# Patient Record
Sex: Female | Born: 1975 | Race: Black or African American | Hispanic: No | Marital: Single | Smoking: Never smoker
Health system: Southern US, Community
[De-identification: ages and names within clinical notes are randomized; demographics above are authoritative.]

## PROBLEM LIST (undated history)

## (undated) DIAGNOSIS — I10 Essential (primary) hypertension: Secondary | ICD-10-CM

## (undated) DIAGNOSIS — C189 Malignant neoplasm of colon, unspecified: Secondary | ICD-10-CM

## (undated) DIAGNOSIS — Z803 Family history of malignant neoplasm of breast: Secondary | ICD-10-CM

## (undated) DIAGNOSIS — F339 Major depressive disorder, recurrent, unspecified: Secondary | ICD-10-CM

## (undated) DIAGNOSIS — T4145XA Adverse effect of unspecified anesthetic, initial encounter: Secondary | ICD-10-CM

## (undated) DIAGNOSIS — G4730 Sleep apnea, unspecified: Secondary | ICD-10-CM

## (undated) DIAGNOSIS — G43909 Migraine, unspecified, not intractable, without status migrainosus: Secondary | ICD-10-CM

## (undated) DIAGNOSIS — D573 Sickle-cell trait: Secondary | ICD-10-CM

## (undated) DIAGNOSIS — T8859XA Other complications of anesthesia, initial encounter: Secondary | ICD-10-CM

## (undated) DIAGNOSIS — F4322 Adjustment disorder with anxiety: Secondary | ICD-10-CM

## (undated) DIAGNOSIS — D649 Anemia, unspecified: Secondary | ICD-10-CM

## (undated) DIAGNOSIS — F419 Anxiety disorder, unspecified: Secondary | ICD-10-CM

## (undated) DIAGNOSIS — I639 Cerebral infarction, unspecified: Secondary | ICD-10-CM

## (undated) HISTORY — DX: Family history of malignant neoplasm of breast: Z80.3

## (undated) HISTORY — PX: ILEOSTOMY REVISION: SHX1785

## (undated) HISTORY — PX: TUBAL LIGATION: SHX77

## (undated) HISTORY — DX: Sickle-cell trait: D57.3

## (undated) HISTORY — DX: Malignant neoplasm of colon, unspecified: C18.9

## (undated) HISTORY — PX: ENDOMETRIAL ABLATION: SHX621

## (undated) HISTORY — DX: Anemia, unspecified: D64.9

## (undated) HISTORY — PX: WISDOM TOOTH EXTRACTION: SHX21

## (undated) HISTORY — DX: Sleep apnea, unspecified: G47.30

## (undated) HISTORY — PX: TONSILLECTOMY: SUR1361

## (undated) HISTORY — PX: ABDOMINAL HYSTERECTOMY: SHX81

## (undated) HISTORY — PX: AUGMENTATION MAMMAPLASTY: SUR837

## (undated) HISTORY — DX: Major depressive disorder, recurrent, unspecified: F33.9

## (undated) HISTORY — DX: Cerebral infarction, unspecified: I63.9

---
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
## 2016-08-07 DIAGNOSIS — C189 Malignant neoplasm of colon, unspecified: Secondary | ICD-10-CM

## 2016-08-07 HISTORY — DX: Malignant neoplasm of colon, unspecified: C18.9

## 2016-09-04 ENCOUNTER — Emergency Department (HOSPITAL_COMMUNITY): Payer: Self-pay

## 2016-09-04 ENCOUNTER — Emergency Department (HOSPITAL_COMMUNITY)
Admission: EM | Admit: 2016-09-04 | Discharge: 2016-09-04 | Disposition: A | Discharge status: Home / Self Care | Payer: Self-pay | Attending: Emergency Medicine | Admitting: Emergency Medicine

## 2016-09-04 ENCOUNTER — Encounter (HOSPITAL_COMMUNITY): Payer: Self-pay | Admitting: Emergency Medicine

## 2016-09-04 DIAGNOSIS — Y939 Activity, unspecified: Secondary | ICD-10-CM | POA: Insufficient documentation

## 2016-09-04 DIAGNOSIS — Y929 Unspecified place or not applicable: Secondary | ICD-10-CM | POA: Insufficient documentation

## 2016-09-04 DIAGNOSIS — W010XXA Fall on same level from slipping, tripping and stumbling without subsequent striking against object, initial encounter: Secondary | ICD-10-CM | POA: Insufficient documentation

## 2016-09-04 DIAGNOSIS — Y999 Unspecified external cause status: Secondary | ICD-10-CM | POA: Insufficient documentation

## 2016-09-04 DIAGNOSIS — S9031XA Contusion of right foot, initial encounter: Secondary | ICD-10-CM | POA: Insufficient documentation

## 2016-09-04 DIAGNOSIS — R030 Elevated blood-pressure reading, without diagnosis of hypertension: Secondary | ICD-10-CM | POA: Insufficient documentation

## 2016-09-04 MED ORDER — HYDROCODONE-ACETAMINOPHEN 5-325 MG PO TABS
1.0000 | ORAL_TABLET | Freq: Once | ORAL | Status: AC
  Administered 2016-09-04: 1 via ORAL
  Filled 2016-09-04: qty 1

## 2016-09-04 MED ORDER — HYDROCODONE-ACETAMINOPHEN 5-325 MG PO TABS: ORAL_TABLET | ORAL | 0 refills | Status: DC

## 2016-09-04 NOTE — ED Provider Notes (Signed)
[facility] DEPT Provider Note   CSN: [id number] Arrival date & time: 09/04/16  2220     History   Chief Complaint Chief Complaint  Patient presents with  . Foot Injury     HPI  Blood pressure (!) 185/133, pulse 88, temperature 98.4 F (36.9 C), temperature source Oral, resp. rate 18, SpO2 98 %.  Donna Brandt is a 41 y.o. female complaining of persistent right foot pain and swelling status post mechanical slip and fall on ice approximately 10 days ago. She has been icing it and taking ibuprofen with little relief. Patient is walking a lot for her new job. She states that the pain is moderate to severe and worse at the end of the day.  No past medical history on file.  There are no active problems to display for this patient.   Past Surgical History:  Procedure Laterality Date  . ABDOMINAL HYSTERECTOMY    . TONSILLECTOMY    . TUBAL LIGATION      OB History    No data available       Home Medications    Prior to Admission medications   Not on File    Family History No family history on file.  Social History Social History  Substance Use Topics  . Smoking status: Never Smoker  . Smokeless tobacco: Not on file  . Alcohol use Yes     Allergies   Dilaudid [hydromorphone hcl]   Review of Systems Review of Systems  10 systems reviewed and found to be negative, except as noted in the HPI.   Physical Exam Updated Vital Signs BP (!) 185/133 (BP Location: Right Arm)   Pulse 88   Temp 98.4 F (36.9 C) (Oral)   Resp 18   SpO2 98%   Physical Exam  Constitutional: She is oriented to person, place, and time. She appears well-developed and well-nourished. No distress.  HENT:  Head: Normocephalic and atraumatic.  Mouth/Throat: Oropharynx is clear and moist.  Eyes: Conjunctivae and EOM are normal. Pupils are equal, round, and reactive to light.  Neck: Normal range of motion.  Cardiovascular: Normal rate, regular rhythm and intact distal pulses.     Pulmonary/Chest: Effort normal and breath sounds normal.  Abdominal: Soft. There is no tenderness.  Musculoskeletal: Normal range of motion. She exhibits edema and tenderness.  Diffuse edema and tenderness to right foot, most focally tender on the dorsum. DP and PT pulses are 2+ and patient has excellent range of motion to toes and can differentiate between pinprick and light touch. No tenderness to palpation along the bilateral malleoli.  Neurological: She is alert and oriented to person, place, and time.  Skin: She is not diaphoretic.  Psychiatric: She has a normal mood and affect.  Nursing note and vitals reviewed.    ED Treatments / Results  Labs (all labs ordered are listed, but only abnormal results are displayed) Labs Reviewed - No data to display  EKG  EKG Interpretation None       Radiology Dg Foot Complete Right  Result Date: 09/04/2016 CLINICAL DATA:  Status post fall, with sharp right foot pain and limited range of motion. Initial encounter. EXAM: RIGHT FOOT COMPLETE - 3+ VIEW COMPARISON:  None. FINDINGS: There is no evidence of fracture or dislocation. The joint spaces are preserved. There is no evidence of talar subluxation; the subtalar joint is unremarkable in appearance. No significant soft tissue abnormalities are seen. IMPRESSION: No evidence of fracture or dislocation. Electronically Signed   By:  Garald Balding M.D.   On: 09/04/2016 22:55    Procedures Procedures (including critical care time)  Medications Ordered in ED Medications  HYDROcodone-acetaminophen (NORCO/VICODIN) 5-325 MG per tablet 1 tablet (not administered)     Initial Impression / Assessment and Plan / ED Course  I have reviewed the triage vital signs and the nursing notes.  Pertinent labs & imaging results that were available during my care of the patient were reviewed by me and considered in my medical decision making (see chart for details).    Vitals:   09/04/16 2229  BP: (!)  185/133  Pulse: 88  Resp: 18  Temp: 98.4 F (36.9 C)  TempSrc: Oral  SpO2: 98%    Medications  HYDROcodone-acetaminophen (NORCO/VICODIN) 5-325 MG per tablet 1 tablet (not administered)    Donna Brandt is 41 y.o. female presenting with Persistent right foot pain status post mechanical fall several weeks ago. Neurovascular intact. X-ray negative. She is quite swollen. I think that she really needs to be nonweightbearing, I've counseled her that she should rest, ice and compress the foot and should not weight-bear until the swelling and pain have resolved. Blood pressure is elevated, advised her to follow with her primary care doctor on this.  Evaluation does not show pathology that would require ongoing emergent intervention or inpatient treatment. Pt is hemodynamically stable and mentating appropriately. Discussed findings and plan with patient/guardian, who agrees with care plan. All questions answered. Return precautions discussed and outpatient follow up given.      Final Clinical Impressions(s) / ED Diagnoses   Final diagnoses:  Contusion of right foot, initial encounter  Elevated blood pressure reading    New Prescriptions New Prescriptions   No medications on file     Monico Blitz, PA-C 09/04/16 [place], MD 09/06/16 [phone]

## 2016-09-04 NOTE — Discharge Instructions (Signed)
Rest, Ice intermittently (in the first 24-48 hours), Gentle compression with an Ace wrap, and elevate (Limb above the level of the heart)   Take up to 800mg  of ibuprofen (that is usually 4 over the counter pills)  3 times a day for 5 days. Take with food.  Take vicodin for breakthrough pain, do not drink alcohol, drive, care for children or do other critical tasks while taking vicodin.  Please follow with your primary care doctor in the next 5 days for high blood pressure evaluation. If you do not have a primary care doctor, present to urgent care. Reduce salt intake. Seek emergency medical care for unilateral weakness, slurring, change in vision, or chest pain and shortness of breath.

## 2016-09-04 NOTE — ED Triage Notes (Signed)
Pt states she fell on the ice 1.5 weeks ago and still has R foot pain and swelling. Alert and oriented.

## 2016-12-10 ENCOUNTER — Emergency Department (HOSPITAL_COMMUNITY)
Admission: EM | Admit: 2016-12-10 | Discharge: 2016-12-11 | Disposition: A | Discharge status: Home / Self Care | Payer: Medicaid Other | Attending: Emergency Medicine | Admitting: Emergency Medicine

## 2016-12-10 ENCOUNTER — Encounter (HOSPITAL_COMMUNITY): Payer: Self-pay | Admitting: Emergency Medicine

## 2016-12-10 DIAGNOSIS — R51 Headache: Secondary | ICD-10-CM | POA: Diagnosis not present

## 2016-12-10 DIAGNOSIS — R519 Headache, unspecified: Secondary | ICD-10-CM

## 2016-12-10 DIAGNOSIS — Z79899 Other long term (current) drug therapy: Secondary | ICD-10-CM | POA: Insufficient documentation

## 2016-12-10 HISTORY — DX: Migraine, unspecified, not intractable, without status migrainosus: G43.909

## 2016-12-10 NOTE — ED Triage Notes (Signed)
Patient complaining of migraine. Patient states it started around 6 pm and took some ibuprofen. She states she still has the migraine.

## 2016-12-10 NOTE — ED Provider Notes (Signed)
[facility] DEPT Provider Note   CSN: [id number] Arrival date & time: 12/10/16  2304  By signing my name below, I, Higinio Plan, attest that this documentation has been prepared under the direction and in the presence of non-physician practitioner, Montine Circle, PA-C. Electronically Signed: Higinio Plan, Scribe. 12/11/2016. 12:07 AM.  History   Chief Complaint Chief Complaint  Patient presents with  . Migraine   The history is provided by the patient. No language interpreter was used.   HPI Comments: Donna Brandt is a 41 y.o. female with PMHx of migraines, who presents to the Emergency Department complaining of gradually worsening, headache consistent with previous migraines that began ~6 hours PTA. Pt reports her last migraine occurred ~2.5 years ago. She notes associated photophobia and phonophobia. She states she has taken Ibuprofen for her pain with mild relief. Pt denies any fever, chills, nausea, vomiting, or recent consultation with a neurologist for her recurrent headaches.   Past Medical History:  Diagnosis Date  . Migraine    There are no active problems to display for this patient.  Past Surgical History:  Procedure Laterality Date  . ABDOMINAL HYSTERECTOMY    . TONSILLECTOMY    . TUBAL LIGATION      OB History    No data available     Home Medications    Prior to Admission medications   Medication Sig Start Date End Date Taking? Authorizing Provider  HYDROcodone-acetaminophen (NORCO/VICODIN) 5-325 MG tablet Take 1-2 tablets by mouth every 6 hours as needed for pain and/or cough. 09/04/16   Pisciotta, Charna Elizabeth    Family History History reviewed. No pertinent family history.  Social History Social History  Substance Use Topics  . Smoking status: Never Smoker  . Smokeless tobacco: Never Used  . Alcohol use Yes   Allergies   Dilaudid [hydromorphone hcl]  Review of Systems Review of Systems  Constitutional: Negative for chills and fever.  Eyes:  Positive for photophobia.  Gastrointestinal: Negative for nausea and vomiting.  Neurological: Positive for headaches.   Physical Exam Updated Vital Signs BP (!) 162/122 (BP Location: Left Arm)   Pulse 92   Temp 98.4 F (36.9 C) (Oral)   Resp 16   Ht 5\' 7"  (1.702 m)   Wt 180 lb (81.6 kg)   SpO2 100%   BMI 28.19 kg/m   Physical Exam  Constitutional: She is oriented to person, place, and time. She appears well-developed and well-nourished. No distress.  HENT:  Head: Normocephalic and atraumatic.  Right Ear: External ear normal.  Left Ear: External ear normal.  Eyes: Conjunctivae and EOM are normal. Pupils are equal, round, and reactive to light.  Neck: Normal range of motion. Neck supple.  No pain with neck flexion, no meningismus  Cardiovascular: Normal rate, regular rhythm and normal heart sounds.  Exam reveals no gallop and no friction rub.   No murmur heard. Pulmonary/Chest: Effort normal and breath sounds normal. No respiratory distress. She has no wheezes. She has no rales. She exhibits no tenderness.  Abdominal: Soft. She exhibits no distension and no mass. There is no tenderness. There is no rebound and no guarding.  Musculoskeletal: Normal range of motion. She exhibits no edema or tenderness.  Normal gait.  Neurological: She is alert and oriented to person, place, and time. She has normal reflexes.  CN 3-12 intact, normal finger to nose, no pronator drift, sensation and strength intact bilaterally.  Skin: Skin is warm and dry.  Psychiatric: She has a normal mood and  affect. Her behavior is normal. Judgment and thought content normal.  Nursing note and vitals reviewed.  ED Treatments / Results  DIAGNOSTIC STUDIES:  Oxygen Saturation is 100% on RA, normal by my interpretation.    COORDINATION OF CARE:  12:07 AM Discussed treatment plan with pt at bedside and pt agreed to plan.  Labs (all labs ordered are listed, but only abnormal results are displayed) Labs  Reviewed - No data to display  EKG  EKG Interpretation None       Radiology No results found.  Procedures Procedures (including critical care time)  Medications Ordered in ED Medications - No data to display  Initial Impression / Assessment and Plan / ED Course  I have reviewed the triage vital signs and the nursing notes.  Pertinent labs & imaging results that were available during my care of the patient were reviewed by me and considered in my medical decision making (see chart for details).     Pt HA treated and improved while in ED.  Presentation is like pts typical HA and is not concerning for [facility], ICH, Meningitis, or temporal arteritis. Pt is afebrile with no focal neuro deficits, nuchal rigidity, or change in vision. Pt is to follow up with PCP to discuss prophylactic medication. Pt verbalizes understanding and is agreeable with plan to dc.    I personally performed the services described in this documentation, which was scribed in my presence. The recorded information has been reviewed and is accurate.     Final Clinical Impressions(s) / ED Diagnoses   Final diagnoses:  Acute nonintractable headache, unspecified headache type    New Prescriptions New Prescriptions   No medications on file     Montine Circle, Hershal Coria 12/11/16 0207    Rolland Porter, MD 12/11/16 0400

## 2016-12-11 MED ORDER — KETOROLAC TROMETHAMINE 30 MG/ML IJ SOLN
30.0000 mg | Freq: Once | INTRAMUSCULAR | Status: AC
  Administered 2016-12-11: 30 mg via INTRAVENOUS
  Filled 2016-12-11: qty 1

## 2016-12-11 MED ORDER — SODIUM CHLORIDE 0.9 % IV BOLUS (SEPSIS)
1000.0000 mL | Freq: Once | INTRAVENOUS | Status: AC
  Administered 2016-12-11: 1000 mL via INTRAVENOUS

## 2016-12-11 MED ORDER — METOCLOPRAMIDE HCL 5 MG/ML IJ SOLN
10.0000 mg | Freq: Once | INTRAMUSCULAR | Status: AC
  Administered 2016-12-11: 10 mg via INTRAVENOUS
  Filled 2016-12-11: qty 2

## 2016-12-11 MED ORDER — DIPHENHYDRAMINE HCL 50 MG/ML IJ SOLN
25.0000 mg | Freq: Once | INTRAMUSCULAR | Status: AC
  Administered 2016-12-11: 25 mg via INTRAVENOUS
  Filled 2016-12-11: qty 1

## 2017-03-23 DIAGNOSIS — K629 Disease of anus and rectum, unspecified: Secondary | ICD-10-CM | POA: Diagnosis not present

## 2017-03-23 DIAGNOSIS — Z6832 Body mass index (BMI) 32.0-32.9, adult: Secondary | ICD-10-CM | POA: Diagnosis not present

## 2017-03-27 DIAGNOSIS — H40033 Anatomical narrow angle, bilateral: Secondary | ICD-10-CM | POA: Diagnosis not present

## 2017-03-27 DIAGNOSIS — H16223 Keratoconjunctivitis sicca, not specified as Sjogren's, bilateral: Secondary | ICD-10-CM | POA: Diagnosis not present

## 2017-03-30 ENCOUNTER — Encounter: Payer: Self-pay | Admitting: Oncology

## 2017-04-10 ENCOUNTER — Encounter: Payer: Self-pay | Admitting: Radiation Oncology

## 2017-04-10 ENCOUNTER — Ambulatory Visit (HOSPITAL_BASED_OUTPATIENT_CLINIC_OR_DEPARTMENT_OTHER): Payer: BLUE CROSS/BLUE SHIELD | Admitting: Oncology

## 2017-04-10 ENCOUNTER — Telehealth: Payer: Self-pay | Admitting: Oncology

## 2017-04-10 VITALS — BP 158/99 | HR 80 | Temp 98.4°F | Resp 17 | Ht 67.0 in | Wt 198.9 lb

## 2017-04-10 DIAGNOSIS — C20 Malignant neoplasm of rectum: Secondary | ICD-10-CM

## 2017-04-10 NOTE — Telephone Encounter (Signed)
Gave patient AVS and calendar of upcoming September appointments.  °

## 2017-04-10 NOTE — Progress Notes (Signed)
[facility] Patient Consult   Referring MD: Angus Palms No address on file   Donna Brandt 41 y.o.  [birth year]    Reason for Referral: Rectal cancer   HPI: Donna Brandt reports intermittent rectal bleeding, abdominal pain, and diarrhea for several months. She saw her primary physician in [place] and was referred to Dr.Saleeby. A colonoscopy (we do not have the colonoscopy report available today) confirmed fragments of an adenomatous lesion with high-grade dysplasia. No definite submucosa was available to evaluate for invasive adenocarcinoma.  She was referred for CTs of the chest, abdomen, and pelvis on 03/16/2017. No adenopathy. No evidence for hepatic metastases. Left anterior rectal wall thickening and hyperenhancement was noted. A lobulated 18 mm low-attenuation lesion was noted in the mesorectal fat felt to possibly represent a fluid collection versus a lymph node. No definite enhancing perirectal lymph nodes were noted. A left ovarian cyst was noted.  She was referred to Dr. Rayann Heman for surgical consultation 03/23/2017. A mass could not be palpated on digital rectal examination. A proctoscopy revealed a rectal mass  at 6-7 centimeters from the anal verge anteriorly. A repeat biopsy was performed. The pathology revealed polypoid colorectal mucosa with detached fragments of adenomatous mucosa and a mucosal lymphoid aggregate.  A pelvic MRI on 03/21/2017 revealed a T3b,N0 tumor. The small fluid density seen on the prior CT appeared to represent simple fluid. A physiologic cyst was noted at the left ovary.  Dr.Altom recommends neoadjuvant chemotherapy/radiation followed by a robotic assisted low anterior resection and temporary diverting loop ileostomy.    Past Medical History:  Diagnosis Date  . MigraineHeadaches      . Hypertension    . G3 P3    . Depression  Past Surgical History:  Procedure Laterality Date  . ABDOMINAL HYSTERECTOMY    .  TONSILLECTOMY    . TUBAL LIGATION      .  Bilateral breast implants   Medications: Reviewed  Allergies:  Allergies  Allergen Reactions  . Dilaudid [Hydromorphone Hcl] Itching  . Latex Rash    Skin sensitivity     Family history: A maternal great aunts had breast cancer in her 6s. She has 4 sisters and 2 brothers. No other family history of cancer.  Social History:   She lives in [place] with 2 children. She works in [employer], research (medical). She does not use cigarettes. She drinks wine occasionally. No transfusion history. No risk factor for HIV or hepatitis.  ROS:   Positives include:Exertional dyspnea, nausea, dull abdominal pain with intermittent episodes of sharp low abdominal pain lasting 2 minutes, rectal bleeding, diarrhea  A complete ROS was otherwise negative.  Physical Exam:  Blood pressure (!) 158/99, pulse 80, temperature 98.4 F (36.9 C), temperature source Oral, resp. rate 17, height 5\' 7"  (1.702 m), weight 198 lb 14.4 oz (90.2 kg), SpO2 100 %.  HEENT: Oral cavity without visible mass, neck without mass  Lungs: Clear bilaterally  Cardiac: Regular rate and rhythm  Abdomen: No hepatosplenomegaly, no mass, tender in the right low abdomen   Vascular: No leg edema  Lymph nodes: No cervical, supraclavicular, axillary, or inguinal nodes  Neurologic: Alert and oriented, the motor exam appears intact in the upper and lower extremities  Skin: No rash, multiple tattoos  Musculoskeletal: No spine tenderness  LAB:  CEA on 02/28/2017-61.4  Imaging: As per history of present illness, image is not available for review today   Assessment/Plan:   1. Rectal cancer, clinical stage T3b,N0,M0  colonoscopy 03/06/2017-biopsy  of a rectal mass revealed fragments of an adenomatous lesion with high-grade dysplasia, definite submucosa not available to evaluate for invasion  Proctoscopy/biopsy 03/23/2017 confirmed a mass at 6-7 centimeters from the anal verge, biopsy revealed polypoid  colorectal mucosa with detached fragments of adenomatous mucosa  Staging CTs 03/16/2017, anterior rectal mass, no evidence of metastatic disease, no adenopathy,  MRI 03/21/2017- T3b,N0 rectal tumor  2.   Hypertension  3.   Depression     Disposition:   Donna Brandt is is with rectal bleeding, low abdominal pain, and diarrhea. She was found to have a mid rectal mass. On proctoscopy. Staging CTs/pelvic MRI are consistent with a locally advanced rectal cancer. Repeat biopsies have been nondiagnostic for invasive carcinoma, but the clinical history in conjunction with the imaging and elevated CEA are consistent with a diagnosis of invasive adenocarcinoma.  I discussed the rationale for neoadjuvant chemotherapy/radiation to be followed by definitive surgery. I specifically discussed capecitabine chemotherapy to be given concurrent with radiation. She will follow-up in medical oncology after surgery to determine the indication for adjuvant chemotherapy.  I reviewed the potential toxicities associated with capecitabine including the chance for nausea/vomiting, mucositis, diarrhea, alopecia, and hematologic toxicity. We discussed the rash, sun sensitivity, hyperpigmentation, and hand/foot syndrome associated with capecitabine. She will attend a chemotherapy teaching class.  I will present her case at the GI tumor conference. I will refer her to gastroenterology to consider a repeat biopsy to confirm invasive adenocarcinoma.  She will be referred to Dr. Lisbeth Renshaw. I anticipate the start of chemotherapy/radiation the week of 04/23/2017 or 04/30/2017.  50 minutes were spent with the patient today. The majority of the time was used for counseling and coordination of care.  Donneta Romberg, MD  04/10/2017, 2:14 PM

## 2017-04-11 ENCOUNTER — Other Ambulatory Visit: Payer: Self-pay | Admitting: Radiation Oncology

## 2017-04-11 ENCOUNTER — Inpatient Hospital Stay
Admission: RE | Admit: 2017-04-11 | Discharge: 2017-04-11 | Disposition: A | Discharge status: Home / Self Care | Payer: Self-pay | Source: Ambulatory Visit | Attending: Radiation Oncology | Admitting: Radiation Oncology

## 2017-04-11 ENCOUNTER — Telehealth: Payer: Self-pay

## 2017-04-11 ENCOUNTER — Other Ambulatory Visit: Payer: Self-pay

## 2017-04-11 DIAGNOSIS — C801 Malignant (primary) neoplasm, unspecified: Secondary | ICD-10-CM

## 2017-04-11 DIAGNOSIS — D490 Neoplasm of unspecified behavior of digestive system: Secondary | ICD-10-CM

## 2017-04-11 NOTE — Progress Notes (Signed)
GI Location of Tumor / Histology:  Rectal Cancer  Donna Brandt presented with symptoms of intermittent rectal bleeding, abdominal pain, and diarrhea   Biopsies of   03/06/2017 colonoscopy -biopsy of a rectal mass revealed fragments of an adenomatous lesion with high-grade dysplasia, definite submucosa not available to evaluate for invasion  03/23/2017  Proctoscopy/biopsy confirmed a mass at 6-7 centimeters from the anal verge, biopsy revealed polypoid colorectal mucosa with detached fragments of adenomatous mucosa  03/16/2017  Staging CTs 03/16/2017, anterior rectal mass, no evidence of metastatic disease, no adenopathy,  03/21/2017  MRI 03/21/2017- T3b,N0 rectal tumor  Past/Anticipated interventions by surgeon, if any:   Past/Anticipated interventions by medical oncology, if any:   Weight changes, if any:  None  Bowel/Bladder complaints, if any: Having constipation and diarrehea Nausea / Vomiting, if any: Nausea sometime ,No vomiting  Pain issues, if any:  Has a constant pain in her abdominal area .5/10  Any blood per rectum:   Yes ,most of the time.  SAFETY ISSUES:  Prior radiation? No    Pacemaker/ICD? No  Possible current pregnancy?  No  Is the patient on methotrexate?  No  Current Complaints/Details:  Family history: A maternal great aunts had breast cancer in her 52's. No other family history of cancer.   Vitals:   04/12/17 1040  BP: 119/78  Pulse: 70  Resp: 18  Temp: 98.6 F (37 C)  TempSrc: Oral  SpO2: 100%  Weight: 194 lb 4 oz (88.1 kg)   Wt Readings from Last 3 Encounters:  04/12/17 194 lb 4 oz (88.1 kg)  04/10/17 198 lb 14.4 oz (90.2 kg)  12/10/16 180 lb (81.6 kg)

## 2017-04-11 NOTE — Telephone Encounter (Signed)
Donna Brandt (friend that accompanied patient to med/onc appointment on 04/10/17) called on behalf of patient asking about an appointment that was scheduled for 04/12/17.  Progress notes from [facility] show that patient is scheduled for a Flex Sig. on 04/12/17. Patient stated that she was told that her appointments for Rad Onc on 04/12/17 would be cancelled. Questions answered.

## 2017-04-11 NOTE — Telephone Encounter (Signed)
Flex scheduled, pt instructed and medications reviewed.  Patient instructions mailed to home.  Patient to call with any questions or concerns.  

## 2017-04-11 NOTE — Telephone Encounter (Signed)
-----   Message from Milus Banister, MD sent at 04/10/2017  8:14 PM EDT ----- [name] get her in this week.  Thanks  Donald Jacque, She needs flexible sigmoidoscopy this Thursday at [facility] with moderate sedation for rectal tumor.  Thanks  DJ  ----- Message ----- From: Ladell Pier, MD Sent: 04/10/2017   5:43 PM To: Milus Banister, MD  New patient with rectal cancer,T3,N0 by MRI at [facility], tumor at [facility] here from [facility] Had 2 biopsies in [place] that have not confirmed invasive carcinoma, path reports in [place]  Can you get her scheduled for a repeat biopsy in attempt to document invasive disease  Plan for neoadjuvant chemotherapy/radiation  Thanks,  Leroy Sea

## 2017-04-11 NOTE — Progress Notes (Signed)
Late Entry : 04/10/17 @ 14:00  Oncology Nurse Navigator Documentation  Navigator Location: [facility] (04/10/17 1400) Referral date to RadOnc/MedOnc: 03/30/17 (04/10/17 1400) )Navigator Encounter Type: Initial MedOnc (04/10/17 1400)   Met with patient, patient's friend and Dr. Benay Spice during new patient appointment. Patient provided with literature related to rectal cancer, chemotherapy and contact information for Medical/Oncology team. Patient provided with information about my role as GI Navigator as well as my contact information.    Abnormal Finding Date: 03/21/17 (04/10/17 1400) Confirmed Diagnosis Date: 03/21/17 (04/10/17 1400)               Patient Visit Type: MedOnc (04/10/17 1400) Treatment Phase: Pre-Tx/Tx Discussion (04/10/17 1400) Barriers/Navigation Needs: Education;Coordination of Care (04/10/17 1400) Education: Coping with Diagnosis/ Prognosis;Understanding Cancer/ Treatment Options;Newly Diagnosed Cancer Education (04/10/17 1400) Interventions: Education;Psycho-social support (04/10/17 1400)     Education Method: Written;Verbal (04/10/17 1400)  Support Groups/Services: GI Support Group (04/10/17 1400)   Acuity: Level 2 (04/10/17 1400)   Acuity Level 2: Initial guidance, education and coordination as needed;Educational needs;Assistance expediting appointments;Ongoing guidance and education throughout treatment as needed (04/10/17 1400)     Time Spent with Patient: 30 (04/10/17 1400)

## 2017-04-11 NOTE — Telephone Encounter (Signed)
I spoke with the pt's husband.  I was unable to reach the pt by phone, I called the pt's emergency contact (her husband)  He will contact her at work and have her return my call to go over the instructions for tomorrow    Donna Brandt   DOB: [birth year] MRN: [id number] Procedure Date: 04/12/17 Arrival Time: 1245 pm Procedure Time: 215 pm   Location of Procedure: [facility] Registration   PREPARATION FOR FLEXIBLE SIGMOIDOSCOPY WITH MAGNESIUM CITRATE  Prior to the day before your procedure, purchase one 8 oz. bottle of Magnesium Citrate and one Fleet Enema from the laxative section of your drugstore.   THE DAY BEFORE YOUR PROCEDURE:   DATE: 04/11/17  DAY: Wednesday  1. Have a clear liquid dinner the night before your procedure.  2. Do not drink anything colored red or purple. Avoid juices with pulp. No orange juice.  CLEAR LIQUIDS INCLUDE: Water Jello  Ice Popsicles  Tea (sugar ok, no milk/cream) Powdered fruit flavored drinks  Coffee (sugar ok, no milk/cream) Gatorade  Juice: apple, white grape, white cranberry Lemonade  Clear bullion, consomme, broth Carbonated beverages (any kind)  Strained chicken noodle soup Hard Candy   3. At 7:00 pm the night before your procedure, drink one bottle of Magnesium Citrate over ice.  4. Drink at least 3 more glasses of clear liquids before bedtime (preferably juices).  5. Results are expected usually within 1 to 6 hours after taking the Magnesium Citrate.    THE DAY OF YOUR PROCEDURE:   DATE: 04/12/17 DAY: Thursday  1. Use Fleet Enema one hour prior to coming for procedure  2. you may drink clear liquids until 1015 am, which is 4 hours before your procedure.   MEDICATION INSTRUCTIONS  Unless otherwise instructed, you should take regular prescription medications with a small sip of water as early as possible the morning of your procedure.  Diabetic patients - see separate instructions.    OTHER INSTRUCTIONS  You will need  a responsible adult at least 42 years of age to accompany you and drive you home. This person must remain in the waiting room during your procedure.  Wear loose fitting clothing that is easily removed.  Leave jewelry and other valuables at home. However, you may wish to bring a book to read or an iPod/MP3 player to listen to music as you wait for your procedure to start.  Remove all body piercing jewelry and leave at home.  Total time from sign-in until discharge is approximately 2-3 hours.  You should go home directly after your procedure and rest. You can resume normal activities the day after your procedure.  The day of your procedure you should not:  Drive  Make legal decisions  Operate machinery  Drink alcohol  Return to work  You will receive specific instructions about eating, activities and medications before you leave.

## 2017-04-12 ENCOUNTER — Ambulatory Visit
Admission: RE | Admit: 2017-04-12 | Discharge: 2017-04-12 | Disposition: A | Discharge status: Home / Self Care | Payer: BLUE CROSS/BLUE SHIELD | Source: Ambulatory Visit | Attending: Radiation Oncology | Admitting: Radiation Oncology

## 2017-04-12 ENCOUNTER — Encounter: Payer: Self-pay | Admitting: Radiation Oncology

## 2017-04-12 ENCOUNTER — Encounter (HOSPITAL_COMMUNITY)
Admission: RE | Disposition: A | Discharge status: Home / Self Care | Payer: Self-pay | Source: Ambulatory Visit | Attending: Gastroenterology

## 2017-04-12 ENCOUNTER — Ambulatory Visit: Payer: BLUE CROSS/BLUE SHIELD | Admitting: Radiation Oncology

## 2017-04-12 ENCOUNTER — Encounter (HOSPITAL_COMMUNITY): Payer: Self-pay

## 2017-04-12 ENCOUNTER — Ambulatory Visit (HOSPITAL_COMMUNITY)
Admission: RE | Admit: 2017-04-12 | Discharge: 2017-04-12 | Disposition: A | Discharge status: Home / Self Care | Payer: BLUE CROSS/BLUE SHIELD | Source: Ambulatory Visit | Attending: Gastroenterology | Admitting: Gastroenterology

## 2017-04-12 ENCOUNTER — Telehealth: Payer: Self-pay | Admitting: Pharmacist

## 2017-04-12 VITALS — BP 119/78 | HR 70 | Temp 98.6°F | Resp 18 | Wt 194.2 lb

## 2017-04-12 DIAGNOSIS — R197 Diarrhea, unspecified: Secondary | ICD-10-CM | POA: Insufficient documentation

## 2017-04-12 DIAGNOSIS — Z803 Family history of malignant neoplasm of breast: Secondary | ICD-10-CM | POA: Insufficient documentation

## 2017-04-12 DIAGNOSIS — C20 Malignant neoplasm of rectum: Secondary | ICD-10-CM | POA: Insufficient documentation

## 2017-04-12 DIAGNOSIS — Z79891 Long term (current) use of opiate analgesic: Secondary | ICD-10-CM | POA: Insufficient documentation

## 2017-04-12 DIAGNOSIS — N83202 Unspecified ovarian cyst, left side: Secondary | ICD-10-CM | POA: Insufficient documentation

## 2017-04-12 DIAGNOSIS — Z885 Allergy status to narcotic agent status: Secondary | ICD-10-CM | POA: Insufficient documentation

## 2017-04-12 DIAGNOSIS — R109 Unspecified abdominal pain: Secondary | ICD-10-CM | POA: Diagnosis not present

## 2017-04-12 DIAGNOSIS — Z91040 Latex allergy status: Secondary | ICD-10-CM | POA: Insufficient documentation

## 2017-04-12 DIAGNOSIS — Z90710 Acquired absence of both cervix and uterus: Secondary | ICD-10-CM | POA: Insufficient documentation

## 2017-04-12 DIAGNOSIS — Z510 Encounter for antineoplastic radiation therapy: Secondary | ICD-10-CM | POA: Insufficient documentation

## 2017-04-12 DIAGNOSIS — K629 Disease of anus and rectum, unspecified: Secondary | ICD-10-CM | POA: Diagnosis present

## 2017-04-12 DIAGNOSIS — F329 Major depressive disorder, single episode, unspecified: Secondary | ICD-10-CM | POA: Diagnosis not present

## 2017-04-12 DIAGNOSIS — Z79899 Other long term (current) drug therapy: Secondary | ICD-10-CM | POA: Insufficient documentation

## 2017-04-12 DIAGNOSIS — Z9889 Other specified postprocedural states: Secondary | ICD-10-CM | POA: Insufficient documentation

## 2017-04-12 DIAGNOSIS — I10 Essential (primary) hypertension: Secondary | ICD-10-CM | POA: Diagnosis not present

## 2017-04-12 DIAGNOSIS — D490 Neoplasm of unspecified behavior of digestive system: Secondary | ICD-10-CM

## 2017-04-12 HISTORY — PX: FLEXIBLE SIGMOIDOSCOPY: SHX5431

## 2017-04-12 HISTORY — DX: Essential (primary) hypertension: I10

## 2017-04-12 SURGERY — SIGMOIDOSCOPY, FLEXIBLE
Anesthesia: Moderate Sedation

## 2017-04-12 MED ORDER — CAPECITABINE 500 MG PO TABS: ORAL_TABLET | ORAL | 0 refills | Status: DC

## 2017-04-12 MED ORDER — SPOT INK MARKER SYRINGE KIT
PACK | SUBMUCOSAL | Status: AC
  Filled 2017-04-12: qty 5

## 2017-04-12 MED ORDER — SPOT INK MARKER SYRINGE KIT
PACK | SUBMUCOSAL | Status: DC | PRN
  Administered 2017-04-12: 2 mL via SUBMUCOSAL

## 2017-04-12 MED ORDER — FENTANYL CITRATE (PF) 100 MCG/2ML IJ SOLN
INTRAMUSCULAR | Status: AC
  Filled 2017-04-12: qty 2

## 2017-04-12 MED ORDER — MIDAZOLAM HCL 5 MG/ML IJ SOLN
INTRAMUSCULAR | Status: AC
  Filled 2017-04-12: qty 2

## 2017-04-12 MED ORDER — SODIUM CHLORIDE 0.9 % IV SOLN: INTRAVENOUS | Status: DC

## 2017-04-12 MED ORDER — FENTANYL CITRATE (PF) 100 MCG/2ML IJ SOLN
INTRAMUSCULAR | Status: DC | PRN
  Administered 2017-04-12 (×2): 25 ug via INTRAVENOUS

## 2017-04-12 MED ORDER — DIPHENHYDRAMINE HCL 50 MG/ML IJ SOLN
INTRAMUSCULAR | Status: AC
  Filled 2017-04-12: qty 1

## 2017-04-12 MED ORDER — MIDAZOLAM HCL 10 MG/2ML IJ SOLN
INTRAMUSCULAR | Status: DC | PRN
Start: 2017-04-12 — End: 2017-04-12
  Administered 2017-04-12: 1 mg via INTRAVENOUS
  Administered 2017-04-12 (×3): 2 mg via INTRAVENOUS

## 2017-04-12 NOTE — H&P (View-Only) (Signed)
[facility] Patient Consult   Referring MD: Angus Palms No address on file   Donna Brandt 41 y.o.  [birth year]    Reason for Referral: Rectal cancer   HPI: Donna Brandt reports intermittent rectal bleeding, abdominal pain, and diarrhea for several months. She saw her primary physician in [place] and was referred to Dr.Saleeby. A colonoscopy (we do not have the colonoscopy report available today) confirmed fragments of an adenomatous lesion with high-grade dysplasia. No definite submucosa was available to evaluate for invasive adenocarcinoma.  She was referred for CTs of the chest, abdomen, and pelvis on 03/16/2017. No adenopathy. No evidence for hepatic metastases. Left anterior rectal wall thickening and hyperenhancement was noted. A lobulated 18 mm low-attenuation lesion was noted in the mesorectal fat felt to possibly represent a fluid collection versus a lymph node. No definite enhancing perirectal lymph nodes were noted. A left ovarian cyst was noted.  She was referred to Dr. Rayann Heman for surgical consultation 03/23/2017. A mass could not be palpated on digital rectal examination. A proctoscopy revealed a rectal mass  at 6-7 centimeters from the anal verge anteriorly. A repeat biopsy was performed. The pathology revealed polypoid colorectal mucosa with detached fragments of adenomatous mucosa and a mucosal lymphoid aggregate.  A pelvic MRI on 03/21/2017 revealed a T3b,N0 tumor. The small fluid density seen on the prior CT appeared to represent simple fluid. A physiologic cyst was noted at the left ovary.  Dr.Altom recommends neoadjuvant chemotherapy/radiation followed by a robotic assisted low anterior resection and temporary diverting loop ileostomy.    Past Medical History:  Diagnosis Date  . MigraineHeadaches      . Hypertension    . G3 P3    . Depression  Past Surgical History:  Procedure Laterality Date  . ABDOMINAL HYSTERECTOMY    .  TONSILLECTOMY    . TUBAL LIGATION      .  Bilateral breast implants   Medications: Reviewed  Allergies:  Allergies  Allergen Reactions  . Dilaudid [Hydromorphone Hcl] Itching  . Latex Rash    Skin sensitivity     Family history: A maternal great aunts had breast cancer in her 53s. She has 4 sisters and 2 brothers. No other family history of cancer.  Social History:   She lives in [place] with 2 children. She works in [employer], research (medical). She does not use cigarettes. She drinks wine occasionally. No transfusion history. No risk factor for HIV or hepatitis.  ROS:   Positives include:Exertional dyspnea, nausea, dull abdominal pain with intermittent episodes of sharp low abdominal pain lasting 2 minutes, rectal bleeding, diarrhea  A complete ROS was otherwise negative.  Physical Exam:  Blood pressure (!) 158/99, pulse 80, temperature 98.4 F (36.9 C), temperature source Oral, resp. rate 17, height 5\' 7"  (1.702 m), weight 198 lb 14.4 oz (90.2 kg), SpO2 100 %.  HEENT: Oral cavity without visible mass, neck without mass  Lungs: Clear bilaterally  Cardiac: Regular rate and rhythm  Abdomen: No hepatosplenomegaly, no mass, tender in the right low abdomen   Vascular: No leg edema  Lymph nodes: No cervical, supraclavicular, axillary, or inguinal nodes  Neurologic: Alert and oriented, the motor exam appears intact in the upper and lower extremities  Skin: No rash, multiple tattoos  Musculoskeletal: No spine tenderness  LAB:  CEA on 02/28/2017-61.4  Imaging: As per history of present illness, image is not available for review today   Assessment/Plan:   1. Rectal cancer, clinical stage T3b,N0,M0  colonoscopy 03/06/2017-biopsy  of a rectal mass revealed fragments of an adenomatous lesion with high-grade dysplasia, definite submucosa not available to evaluate for invasion  Proctoscopy/biopsy 03/23/2017 confirmed a mass at 6-7 centimeters from the anal verge, biopsy revealed polypoid  colorectal mucosa with detached fragments of adenomatous mucosa  Staging CTs 03/16/2017, anterior rectal mass, no evidence of metastatic disease, no adenopathy,  MRI 03/21/2017- T3b,N0 rectal tumor  2.   Hypertension  3.   Depression     Disposition:   Donna Brandt is is with rectal bleeding, low abdominal pain, and diarrhea. She was found to have a mid rectal mass. On proctoscopy. Staging CTs/pelvic MRI are consistent with a locally advanced rectal cancer. Repeat biopsies have been nondiagnostic for invasive carcinoma, but the clinical history in conjunction with the imaging and elevated CEA are consistent with a diagnosis of invasive adenocarcinoma.  I discussed the rationale for neoadjuvant chemotherapy/radiation to be followed by definitive surgery. I specifically discussed capecitabine chemotherapy to be given concurrent with radiation. She will follow-up in medical oncology after surgery to determine the indication for adjuvant chemotherapy.  I reviewed the potential toxicities associated with capecitabine including the chance for nausea/vomiting, mucositis, diarrhea, alopecia, and hematologic toxicity. We discussed the rash, sun sensitivity, hyperpigmentation, and hand/foot syndrome associated with capecitabine. She will attend a chemotherapy teaching class.  I will present her case at the GI tumor conference. I will refer her to gastroenterology to consider a repeat biopsy to confirm invasive adenocarcinoma.  She will be referred to Dr. Lisbeth Renshaw. I anticipate the start of chemotherapy/radiation the week of 04/23/2017 or 04/30/2017.  50 minutes were spent with the patient today. The majority of the time was used for counseling and coordination of care.  Donneta Romberg, MD  04/10/2017, 2:14 PM

## 2017-04-12 NOTE — Interval H&P Note (Signed)
History and Physical Interval Note:  04/12/2017 12:51 PM  Donna Brandt  has presented today for surgery, with the diagnosis of rectal tumor   The various methods of treatment have been discussed with the patient and family. After consideration of risks, benefits and other options for treatment, the patient has consented to  Procedure(s): FLEXIBLE SIGMOIDOSCOPY (N/A) as a surgical intervention .  The patient's history has been reviewed, patient examined, no change in status, stable for surgery.  I have reviewed the patient's chart and labs.  Questions were answered to the patient's satisfaction.     Milus Banister

## 2017-04-12 NOTE — Op Note (Signed)
[facility] Patient Name: Donna Brandt Procedure Date: 04/12/2017 MRN: [id number] Attending MD: Milus Banister , MD Date of Birth: 03/01/1976 CSN: [id number] Age: 41 Admit Type: Outpatient Procedure:                Flexible Sigmoidoscopy Indications:              Rectal mass Providers:                Milus Banister, MD, Cleda Daub, RN, Elspeth Cho Tech., Technician Referring MD:             Julieanne Manson, MD Medicines:                Fentanyl 50 micrograms IV, Midazolam 7 mg IV Complications:            No immediate complications. Estimated blood loss:                            None. Estimated Blood Loss:     Estimated blood loss: none. Procedure:                Pre-Anesthesia Assessment:                           - Prior to the procedure, a History and Physical                            was performed, and patient medications and                            allergies were reviewed. The patient's tolerance of                            previous anesthesia was also reviewed. The risks                            and benefits of the procedure and the sedation                            options and risks were discussed with the patient.                            All questions were answered, and informed consent                            was obtained. Prior Anticoagulants: The patient has                            taken no previous anticoagulant or antiplatelet                            agents. ASA Grade Assessment: II - A patient with  mild systemic disease. After reviewing the risks                            and benefits, the patient was deemed in                            satisfactory condition to undergo the procedure.                           After obtaining informed consent, the scope was                            passed under direct vision. The EC-[id number] ([id number])                            scope  was introduced through the anus and advanced                            to the the sigmoid colon. The flexible                            sigmoidoscopy was accomplished without difficulty.                            The patient tolerated the procedure well. The                            quality of the bowel preparation was good. Scope In: Scope Out: Findings:      A 3cm, non-obstructing, heaped up mass was found in the rectum. This was       3cm from the anal verge and lays on the left posterior wall of the       distal rectum. The mass is very suspicious for malignancy however       previous biopsies have not proven that. The mass was extensively       biopsied today and following biopsies I labeled the lateral edges of the       mass with submucosal injection of SPOT (carbon black). Impression:               - 3cm, heaped up, malignant appearing mass along                            the left-posterior wall of the distal rectum with                            distal edge 3cm from the anal verge. The mass was                            biopsied extensively; it seems to have a soft,                            villous exterior but it is firm deeper. Following  biopsies I labeled the lateral borders of the mass                            with submucosal injection of SPOT. Moderate Sedation:      Moderate (conscious) sedation was administered by the endoscopy nurse       and supervised by the endoscopist. The following parameters were       monitored: oxygen saturation, heart rate, blood pressure, and response       to care. Total physician intraservice time was 20 minutes. Recommendation:           - Discharge patient to home (ambulatory).                           - Await final pathology. Procedure Code(s):        --- Professional ---                           [phone], Sigmoidoscopy, flexible; with biopsy, single                            or multiple                            45335, Sigmoidoscopy, flexible; with directed                            submucosal injection(s), any substance                           99152, Moderate sedation services provided by the                            same physician or other qualified health care                            professional performing the diagnostic or                            therapeutic service that the sedation supports,                            requiring the presence of an independent trained                            observer to assist in the monitoring of the                            patient's level of consciousness and physiological                            status; initial 15 minutes of intraservice time,                            patient age 4 years or older Diagnosis Code(s):        --- Professional ---  C20, Malignant neoplasm of rectum                           K62.89, Other specified diseases of anus and rectum CPT copyright 2016 American Medical Association. All rights reserved. The codes documented in this report are preliminary and upon coder review may  be revised to meet current compliance requirements. Milus Banister, MD 04/12/2017 2:02:43 PM This report has been signed electronically. Number of Addenda: 0

## 2017-04-12 NOTE — Progress Notes (Signed)
Radiation Oncology         [phone] ________________________________  Name: Donna Brandt        MRN: [id number]  Date of Service: 04/12/2017 DOB: [birth year]  AY:[id number], No Pcp Per  Ladell Pier, MD     REFERRING PHYSICIAN: Ladell Pier, MD   DIAGNOSIS: The encounter diagnosis was Rectal cancer [facility]).   HISTORY OF PRESENT ILLNESS: Donna Brandt is a 41 y.o. female seen at the request of Dr. Benay Spice for a new diagnosis of rectal cancer. The patient noted sporadic rectal bleeding, abdominal pain, and diarrhea over the course of several months. She relocated to [facility] about a year ago from the [place] area. She returned to her PCP, and after discussing her symptoms, she was then referred to Dr. Suezanne Jacquet. The patient underwent a colonoscopy on 03/06/2017 that revealed rectal mass, and a biopsy of this revealed fragments of an adenomatous lesion with high-grade dysplasia and no definite submucosa was available to evaluate for invasive adenocarcinoma. On 03/16/2017, CTs of the chest and abdomen showed anterior rectal mass, no adenopathy or evidence for hepatic metastases. Following CTs,the patient underwent an MRI on 03/21/2017, which revealed a T3b,N0 rectal tumor. Further biopsy and proctoscopy on 03/23/2017 confirmed a mass at 6-7 centimeters from the anal verge, and biopsy revealed polypoid colorectal mucosa with detached fragments of adenomatous mucosa. She is scheduled for resampling today with Dr. Ardis Hughs this afternoon. She has met with Dr. Benay Spice and is anticipating chemoRT and is awaiting insurance authorization for Xeloda. She is hoping to begin treatment on 04/23/17.  PREVIOUS RADIATION THERAPY: No   PAST MEDICAL HISTORY:  Past Medical History:  Diagnosis Date  . Migraine        PAST SURGICAL HISTORY: Past Surgical History:  Procedure Laterality Date  . ABDOMINAL HYSTERECTOMY     due to uterine fibroids  . TONSILLECTOMY    . TUBAL LIGATION       FAMILY  HISTORY:  Family History  Problem Relation Age of Onset  . Breast cancer Mother   . Breast cancer Other      SOCIAL HISTORY:  reports that she has never smoked. She has never used smokeless tobacco. She reports that she drinks alcohol. She reports that she does not use drugs. The patient is single and in a relationship. She has two sons and a daughter, her daughter is 1 and having some difficulty with processing her mother's new diagnosis.  ALLERGIES: Dilaudid [hydromorphone hcl] and Latex   MEDICATIONS:  Current Outpatient Prescriptions  Medication Sig Dispense Refill  . amLODipine (NORVASC) 10 MG tablet Take 10 mg by mouth daily.    . Probiotic Product (PROBIOTIC-10) CAPS Take 1 capsule by mouth daily.     . sertraline (ZOLOFT) 50 MG tablet Take 50 mg by mouth daily.    . SUMAtriptan (IMITREX) 100 MG tablet Take 100 mg by mouth every 2 (two) hours as needed for migraine. May repeat once in 2 hours if needed  99  . valACYclovir (VALTREX) 500 MG tablet Take 500 mg by mouth daily as needed.    Marland Kitchen HYDROcodone-acetaminophen (NORCO/VICODIN) 5-325 MG tablet Take 1-2 tablets by mouth every 6 hours as needed for pain and/or cough. (Patient not taking: Reported on 12/11/2016) 11 tablet 0  . triamterene-hydrochlorothiazide (MAXZIDE) 75-50 MG tablet Take 1 tablet by mouth every morning.  99   No current facility-administered medications for this encounter.      REVIEW OF SYSTEMS: On review of systems, the patient reports that  she is doing well overall but is nervous about her diagnosis. She denies any chest pain, shortness of breath, cough, fevers, chills, night sweats, unintended weight changes. She denies any bladder disturbances, and denies vomiting. She has occasional nausea that she attributes to blood pressure medication. She reports abdominal pain that is described as fullness and bloating. She also has also noticed sharp pain in the rectal area with bowel movements, and also notes rectal  bleeding with most stools. She denies any new musculoskeletal or joint aches. Patient reports increasing fatigue as well over the past few months.  A complete review of systems is obtained and is otherwise negative.   PHYSICAL EXAM:  Wt Readings from Last 3 Encounters:  04/12/17 194 lb 4 oz (88.1 kg)  04/10/17 198 lb 14.4 oz (90.2 kg)  12/10/16 180 lb (81.6 kg)   Temp Readings from Last 3 Encounters:  04/12/17 98.6 F (37 C) (Oral)  04/10/17 98.4 F (36.9 C) (Oral)  12/10/16 98.4 F (36.9 C) (Oral)   BP Readings from Last 3 Encounters:  04/12/17 119/78  04/10/17 (!) 158/99  12/11/16 (!) 144/94   Pulse Readings from Last 3 Encounters:  04/12/17 70  04/10/17 80  12/11/16 75   Pain Assessment Pain Score: 5  Pain Loc: Abdomen/10  In general this is a well appearing African-American female in no acute distress. She is alert and oriented x4 and appropriate throughout the examination. HEENT reveals that the patient is normocephalic, atraumatic. EOMs are intact. PERRLA. Skin is intact without any evidence of gross lesions. Cardiovascular exam reveals a regular rate and rhythm, no clicks rubs or murmurs are auscultated. Chest is clear to auscultation bilaterally. Lymphatic assessment is performed and does not reveal any adenopathy in the cervical, supraclavicular, axillary, or inguinal chains. Abdomen has active bowel sounds in all quadrants and is intact. The abdomen is soft, non tender, non distended. Lower extremities are negative for pretibial pitting edema, deep calf tenderness, cyanosis or clubbing. Rectal exam is deferred.   ECOG = 0  0 - Asymptomatic (Fully active, able to carry on all predisease activities without restriction)  1 - Symptomatic but completely ambulatory (Restricted in physically strenuous activity but ambulatory and able to carry out work of a light or sedentary nature. For example, light housework, office work)  2 - Symptomatic, <50% in bed during the day  (Ambulatory and capable of all self care but unable to carry out any work activities. Up and about more than 50% of waking hours)  3 - Symptomatic, >50% in bed, but not bedbound (Capable of only limited self-care, confined to bed or chair 50% or more of waking hours)  4 - Bedbound (Completely disabled. Cannot carry on any self-care. Totally confined to bed or chair)  5 - Death   Eustace Pen MM, Creech RH, Tormey DC, et al. [phone]). "Toxicity and response criteria of the [facility]". [place] Oncol. 5 (6): 649-55    LABORATORY DATA:  No results found for: WBC, HGB, HCT, MCV, PLT No results found for: NA, K, CL, CO2 No results found for: ALT, AST, GGT, ALKPHOS, BILITOT    RADIOGRAPHY: No results found.     IMPRESSION/PLAN: 1. Stage IIA, cT3bN0 putative adenocarcinoma of the rectum. Dr. Lisbeth Renshaw discusses the pathology findings and reviews the nature of rectal disease. He reviews the rationale for obtaining definitive confirmation of disease, and for purposes of understanding mutations of her tumor. We would recommend also proceeding with CT simulation, and will reschedule  this. We discussed the risks, benefits, short, and long term effects of radiotherapy, and the patient is interested in proceeding. Dr. Lisbeth Renshaw discusses the delivery and logistics of radiotherapy and would recommend a course of 6 week of treatment. Written consent is obtained and placed in the chart, a copy was provided to the patient. We anticipate starting treatment on 04/23/17. 2. Possible genetic predisposition to malignancy. The patient is counseled on the role for meeting with genetic counseling which she is interested in.  3 Social needs. The patient is interested in meeting with counseling services to help understand the approach for explaining her diagnosis to her daughter. A referral will be placed.  The above documentation reflects my direct findings during this shared patient visit. Please see the  separate note by Dr. Lisbeth Renshaw on this date for the remainder of the patient's plan of care.    Carola Rhine, PAC This document serves as a record of services personally performed by Shona Simpson, PA-C and Kyung Rudd, MD. It was created on their behalf by Valeta Harms, a trained medical scribe. The creation of this record is based on the scribe's personal observations and the providers' statements to them. This document has been checked and approved by the attending provider.

## 2017-04-12 NOTE — Telephone Encounter (Signed)
Oral Oncology Pharmacist Encounter  Received new prescription for Xeloda for the neoadjuvant treatment of rectal cancer in conjunction with radiation, planned duration 5 1/2 - 6 weeks.  Labs from Care Everywhere assessed, OK for treatment, no renal dysfunction noted.  No hepatic function tests to assess, however no indication of hepatic impairment.  No manufacturer adjustments for hepatic impairment provided.  Current medication list in Epic reviewed, no significant DDIs with Xeloda identified.  Prescription has been e-scribed to [place] [phone]) for benefits analysis and approval per insurance requirement.  Noted chemotherapy education class scheduled for 04/19/17.  Oral Oncology Clinic will continue to follow for insurance authorization, copayment issues, initial counseling and start date.  Johny Drilling, PharmD, BCPS, BCOP 04/12/2017 12:17 PM Oral Oncology Clinic [phone]

## 2017-04-12 NOTE — Discharge Instructions (Signed)

## 2017-04-13 ENCOUNTER — Telehealth: Payer: Self-pay | Admitting: Pharmacy Technician

## 2017-04-13 ENCOUNTER — Telehealth: Payer: Self-pay | Admitting: *Deleted

## 2017-04-13 ENCOUNTER — Encounter: Payer: Self-pay | Admitting: Genetics

## 2017-04-13 ENCOUNTER — Telehealth: Payer: Self-pay | Admitting: Genetics

## 2017-04-13 ENCOUNTER — Encounter (HOSPITAL_COMMUNITY): Payer: Self-pay | Admitting: Gastroenterology

## 2017-04-13 NOTE — Telephone Encounter (Signed)
Oral Oncology Patient Advocate Encounter  Received notification from [place] that prior authorization for Xeloda is required.  PA submitted on CoverMyMeds Key [id number] Status is pending  Oral Oncology Clinic will continue to follow.  Fabio Asa. Melynda Keller, [place] Patient [place] [phone] 04/13/2017 9:48 AM

## 2017-04-13 NOTE — Telephone Encounter (Signed)
Oral Oncology Patient Advocate Encounter  Prior Authorization for Xeloda has been approved.    PA# [id number] Effective dates: 04/13/2017 through 04/13/2018  Oral Oncology Clinic will continue to follow.   Fabio Asa. Melynda Keller, [place] Patient [place] [phone] 04/13/2017 2:56 PM

## 2017-04-13 NOTE — Telephone Encounter (Signed)
Called patient to inform of appt. with Ferol Luz on 04-30-17 @ 11 am, spoke with patient and she is aware of this appt.

## 2017-04-13 NOTE — Telephone Encounter (Signed)
Genetic counseling appt has been scheduled for the pt to see Ria Comment on 9/24 at [place] from [place] will notify the pt. Letter mailed.

## 2017-04-16 ENCOUNTER — Other Ambulatory Visit: Payer: Self-pay | Admitting: Oncology

## 2017-04-16 ENCOUNTER — Ambulatory Visit
Admission: RE | Admit: 2017-04-16 | Discharge: 2017-04-16 | Disposition: A | Discharge status: Home / Self Care | Payer: Self-pay | Source: Ambulatory Visit | Attending: Oncology | Admitting: Oncology

## 2017-04-16 ENCOUNTER — Inpatient Hospital Stay
Admission: RE | Admit: 2017-04-16 | Discharge: 2017-04-16 | Disposition: A | Discharge status: Home / Self Care | Payer: Self-pay | Source: Ambulatory Visit | Attending: Oncology | Admitting: Oncology

## 2017-04-16 DIAGNOSIS — C801 Malignant (primary) neoplasm, unspecified: Secondary | ICD-10-CM

## 2017-04-17 ENCOUNTER — Encounter: Payer: Self-pay | Admitting: *Deleted

## 2017-04-17 ENCOUNTER — Ambulatory Visit
Admission: RE | Admit: 2017-04-17 | Discharge: 2017-04-17 | Disposition: A | Discharge status: Home / Self Care | Payer: BLUE CROSS/BLUE SHIELD | Source: Ambulatory Visit | Attending: Radiation Oncology | Admitting: Radiation Oncology

## 2017-04-17 DIAGNOSIS — C20 Malignant neoplasm of rectum: Secondary | ICD-10-CM

## 2017-04-17 DIAGNOSIS — Z510 Encounter for antineoplastic radiation therapy: Secondary | ICD-10-CM | POA: Diagnosis not present

## 2017-04-17 NOTE — Progress Notes (Signed)
  Radiation Oncology         [phone] ________________________________  Name: Dorene Bruni MRN: [id number]  Date: 04/17/2017  DOB: 08-Aug-1975  Optical Surface Tracking Plan:  Since intensity modulated radiotherapy (IMRT) and 3D conformal radiation treatment methods are predicated on accurate and precise positioning for treatment, intrafraction motion monitoring is medically necessary to ensure accurate and safe treatment delivery.  The ability to quantify intrafraction motion without excessive ionizing radiation dose can only be performed with optical surface tracking. Accordingly, surface imaging offers the opportunity to obtain 3D measurements of patient position throughout IMRT and 3D treatments without excessive radiation exposure.  I am ordering optical surface tracking for this patient's upcoming course of radiotherapy. ________________________________  Kyung Rudd, MD 04/17/2017 3:56 PM    Reference:   Ursula Alert, J, et al. Surface imaging-based analysis of intrafraction motion for breast radiotherapy patients.Journal of [place], n. 6, nov. 2014. ISSN [id number].   Available at: <http://www.jacmp.org/index.php/jacmp/article/view/4957>.

## 2017-04-17 NOTE — Progress Notes (Signed)
  Radiation Oncology         [phone] ________________________________  Name: Donna Brandt MRN: [id number]  Date: 04/17/2017  DOB: [birth year]   SIMULATION AND TREATMENT PLANNING NOTE  DIAGNOSIS:     ICD-10-CM   1. 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 The patient was placed in a supine position. A customized vac-lock bag was constructed to aid in patient immobilization on. This complex treatment device will be used on a daily basis during the treatment. In this fashion a CT scan was obtained through the pelvic region and the isocenter was placed near midline within the pelvis. Surface markings were placed.  The patient's imaging was loaded into the radiation treatment planning system. The patient will initially be planned to receive a course of radiation to a dose of 45 Gy. This will be accomplished in 25 fractions at 1.8 gray per fraction. This initial treatment will correspond to a 3-D conformal technique. The target has been contoured in addition to the rectum, bladder and femoral heads. Dose volume histograms of each of these structures have been requested and these will be carefully reviewed as part of the 3-D conformal treatment planning process. To accomplish this initial treatment, 4 customized blocks have been designed for this purpose. Each of these 4 complex treatment devices will be used on a daily basis during the initial course of the treatment. It is anticipated that the patient will then receive a boost for an additional 5.4 Gy. The anticipated total dose therefore will be 50.4 Gy.    Special treatment procedure The patient will receive chemotherapy during the course of radiation treatment. The patient may experience increased or overlapping toxicity due to this combined-modality approach and the patient will be monitored for such problems. This may  include extra lab work as necessary. This therefore constitutes a special treatment procedure.    ________________________________  Jodelle Gross, MD, PhD

## 2017-04-18 DIAGNOSIS — Z510 Encounter for antineoplastic radiation therapy: Secondary | ICD-10-CM | POA: Diagnosis not present

## 2017-04-19 ENCOUNTER — Encounter: Payer: Self-pay | Admitting: Oncology

## 2017-04-19 ENCOUNTER — Other Ambulatory Visit: Payer: BLUE CROSS/BLUE SHIELD

## 2017-04-19 ENCOUNTER — Telehealth: Payer: Self-pay | Admitting: *Deleted

## 2017-04-19 ENCOUNTER — Encounter: Payer: Self-pay | Admitting: *Deleted

## 2017-04-19 ENCOUNTER — Other Ambulatory Visit (HOSPITAL_BASED_OUTPATIENT_CLINIC_OR_DEPARTMENT_OTHER): Payer: BLUE CROSS/BLUE SHIELD

## 2017-04-19 DIAGNOSIS — C20 Malignant neoplasm of rectum: Secondary | ICD-10-CM | POA: Diagnosis not present

## 2017-04-19 LAB — CBC WITH DIFFERENTIAL/PLATELET
BASO%: 0.4 % (ref 0.0–2.0)
Basophils Absolute: 0 10*3/uL (ref 0.0–0.1)
EOS ABS: 0.1 10*3/uL (ref 0.0–0.5)
EOS%: 1.6 % (ref 0.0–7.0)
HCT: 37.3 % (ref 34.8–46.6)
HGB: 12.4 g/dL (ref 11.6–15.9)
LYMPH%: 36.2 % (ref 14.0–49.7)
MCH: 26.5 pg (ref 25.1–34.0)
MCHC: 33.2 g/dL (ref 31.5–36.0)
MCV: 79.7 fL (ref 79.5–101.0)
MONO#: 0.4 10*3/uL (ref 0.1–0.9)
MONO%: 8.5 % (ref 0.0–14.0)
NEUT%: 53.3 % (ref 38.4–76.8)
NEUTROS ABS: 2.4 10*3/uL (ref 1.5–6.5)
PLATELETS: 190 10*3/uL (ref 145–400)
RBC: 4.68 10*6/uL (ref 3.70–5.45)
RDW: 12.5 % (ref 11.2–14.5)
WBC: 4.5 10*3/uL (ref 3.9–10.3)
lymph#: 1.6 10*3/uL (ref 0.9–3.3)

## 2017-04-19 LAB — COMPREHENSIVE METABOLIC PANEL
ALT: 13 U/L (ref 0–55)
ANION GAP: 7 meq/L (ref 3–11)
AST: 14 U/L (ref 5–34)
Albumin: 4 g/dL (ref 3.5–5.0)
Alkaline Phosphatase: 81 U/L (ref 40–150)
BILIRUBIN TOTAL: 0.63 mg/dL (ref 0.20–1.20)
BUN: 8.2 mg/dL (ref 7.0–26.0)
CALCIUM: 10.7 mg/dL — AB (ref 8.4–10.4)
CO2: 25 mEq/L (ref 22–29)
CREATININE: 0.8 mg/dL (ref 0.6–1.1)
Chloride: 107 mEq/L (ref 98–109)
Glucose: 104 mg/dl (ref 70–140)
Potassium: 4 mEq/L (ref 3.5–5.1)
Sodium: 139 mEq/L (ref 136–145)
TOTAL PROTEIN: 7.6 g/dL (ref 6.4–8.3)

## 2017-04-19 LAB — CEA (IN HOUSE-CHCC): CEA (CHCC-In House): 55.78 ng/mL — ABNORMAL HIGH (ref 0.00–5.00)

## 2017-04-19 NOTE — Progress Notes (Signed)
Pt has 2 insurances so copay assistance shouldn't be needed so I contacted Cindy in the radiation dept requesting she reach out to the pt to inform her of the [facility] to assist w/ gas cards. °

## 2017-04-19 NOTE — Telephone Encounter (Signed)
No additional notes needed  

## 2017-04-19 NOTE — Telephone Encounter (Signed)
Oral Chemotherapy Pharmacist Encounter   Patient seen today in office for chemotherapy education class and overview of new oral chemotherapy medication: Xeloda for the treatment of rectal in conjunction with radiation, planned duration 5 1/2 - 6 weeks.   Patient states she has already received her Xeloda tablets from [place].  Counseled patient on administration, dosing, side effects, safe handling, and monitoring. Patient will take Xeloda 500mg  tablets, 4 tablets (2000mg ) by mouth in AM and 3 tabs (1500mg ) by mouth in PM, within 30 minutes of finishing meals, on days of radiation only. Xeloda and radiation start date: 04/23/17  Side effects include but not limited to: fatigue, decerased blood counts, GI upset, diarrhea, and hand-foot syndrome. Patient has loperamide at home and will call the office if diarrhea develops.    Reviewed with patient importance of keeping a medication schedule and plan for any missed doses.  Ms. Fitting voiced understanding and appreciation.   All questions answered.  Patient knows to call the office with questions or concerns. Oral Oncology Clinic will continue to follow.  Thank you,  Johny Drilling, PharmD, BCPS, BCOP 04/19/2017  10:20 AM Oral Oncology Clinic [phone]

## 2017-04-20 ENCOUNTER — Other Ambulatory Visit: Payer: Self-pay | Admitting: *Deleted

## 2017-04-20 DIAGNOSIS — C20 Malignant neoplasm of rectum: Secondary | ICD-10-CM

## 2017-04-20 NOTE — Progress Notes (Signed)
  Oncology Nurse Navigator Documentation  Navigator Location: [facility] (04/20/17 1400)   )Navigator Encounter Type: Telephone (04/20/17 1400) Telephone: [place] Call (04/20/17 1400)  Called patient and left VM checking to see if patient had any questions or concerns that needed addressing.               Treatment Initiated Date: 04/17/17 (04/20/17 1400) Patient Visit Type: Follow-up (04/20/17 1400)       Interventions: Psycho-social support (04/20/17 1400)            Acuity: Level 1 (04/20/17 1400)         Time Spent with Patient: 15 (04/20/17 1400)

## 2017-04-23 ENCOUNTER — Ambulatory Visit
Admission: RE | Admit: 2017-04-23 | Discharge: 2017-04-23 | Disposition: A | Discharge status: Home / Self Care | Payer: BLUE CROSS/BLUE SHIELD | Source: Ambulatory Visit | Attending: Radiation Oncology | Admitting: Radiation Oncology

## 2017-04-23 DIAGNOSIS — Z510 Encounter for antineoplastic radiation therapy: Secondary | ICD-10-CM | POA: Diagnosis not present

## 2017-04-24 ENCOUNTER — Ambulatory Visit
Admission: RE | Admit: 2017-04-24 | Discharge: 2017-04-24 | Disposition: A | Discharge status: Home / Self Care | Payer: BLUE CROSS/BLUE SHIELD | Source: Ambulatory Visit | Attending: Radiation Oncology | Admitting: Radiation Oncology

## 2017-04-24 DIAGNOSIS — Z510 Encounter for antineoplastic radiation therapy: Secondary | ICD-10-CM | POA: Diagnosis not present

## 2017-04-25 ENCOUNTER — Ambulatory Visit
Admission: RE | Admit: 2017-04-25 | Discharge: 2017-04-25 | Disposition: A | Discharge status: Home / Self Care | Payer: BLUE CROSS/BLUE SHIELD | Source: Ambulatory Visit | Attending: Radiation Oncology | Admitting: Radiation Oncology

## 2017-04-25 DIAGNOSIS — Z510 Encounter for antineoplastic radiation therapy: Secondary | ICD-10-CM | POA: Diagnosis not present

## 2017-04-26 ENCOUNTER — Ambulatory Visit
Admission: RE | Admit: 2017-04-26 | Discharge: 2017-04-26 | Disposition: A | Discharge status: Home / Self Care | Payer: BLUE CROSS/BLUE SHIELD | Source: Ambulatory Visit | Attending: Radiation Oncology | Admitting: Radiation Oncology

## 2017-04-26 ENCOUNTER — Telehealth: Payer: Self-pay | Admitting: *Deleted

## 2017-04-26 ENCOUNTER — Other Ambulatory Visit: Payer: Self-pay | Admitting: Radiation Oncology

## 2017-04-26 DIAGNOSIS — Z510 Encounter for antineoplastic radiation therapy: Secondary | ICD-10-CM | POA: Diagnosis not present

## 2017-04-26 MED ORDER — ONDANSETRON HCL 8 MG PO TABS: 8.0000 mg | ORAL_TABLET | Freq: Three times a day (TID) | ORAL | 3 refills | Status: DC | PRN

## 2017-04-26 NOTE — Telephone Encounter (Signed)
rx for zofran  Sent to [facility] per Shona Simpson PAC

## 2017-04-26 NOTE — Telephone Encounter (Signed)
Patient c/o nausea needs [facility] [address], [address] [place]

## 2017-04-26 NOTE — Progress Notes (Signed)
  Pt education done, radiation therapy and you book, my business card given, side effects, skin irritation, fatigue,pain,  nausea,diarrhea,, low fiber diet for diarrhea and imodium prn,, baby wips, sitz bath prn,    may need to eat 5-6 smaller meals instead of 3, and snacks in between, boost,ensure, luke warm showers, unscented soap, norubbing scrubbing or scratching skin area that is treated with radiation,  Loss pubic hair urinary urgency, dysuria, rectal discomfort, sees MD weekly and prn, exercise, get plenty rest,sleep, teach back given .11:44 AM

## 2017-04-27 ENCOUNTER — Encounter: Payer: Self-pay | Admitting: Genetics

## 2017-04-27 ENCOUNTER — Telehealth: Payer: Self-pay | Admitting: *Deleted

## 2017-04-27 ENCOUNTER — Ambulatory Visit
Admission: RE | Admit: 2017-04-27 | Discharge: 2017-04-27 | Disposition: A | Discharge status: Home / Self Care | Payer: BLUE CROSS/BLUE SHIELD | Source: Ambulatory Visit | Attending: Radiation Oncology | Admitting: Radiation Oncology

## 2017-04-27 DIAGNOSIS — Z510 Encounter for antineoplastic radiation therapy: Secondary | ICD-10-CM | POA: Diagnosis not present

## 2017-04-27 NOTE — Telephone Encounter (Signed)
[facility] left vm for rx deacdron and for pharmacy to call [facility] when ready it didn't get e-scribed over  From our PA [employer] 2:35 PM

## 2017-04-30 ENCOUNTER — Ambulatory Visit (HOSPITAL_BASED_OUTPATIENT_CLINIC_OR_DEPARTMENT_OTHER): Payer: BLUE CROSS/BLUE SHIELD | Admitting: Genetics

## 2017-04-30 ENCOUNTER — Other Ambulatory Visit (HOSPITAL_BASED_OUTPATIENT_CLINIC_OR_DEPARTMENT_OTHER): Payer: BLUE CROSS/BLUE SHIELD

## 2017-04-30 ENCOUNTER — Encounter: Payer: Self-pay | Admitting: Genetics

## 2017-04-30 ENCOUNTER — Ambulatory Visit
Admission: RE | Admit: 2017-04-30 | Discharge: 2017-04-30 | Disposition: A | Discharge status: Home / Self Care | Payer: BLUE CROSS/BLUE SHIELD | Source: Ambulatory Visit | Attending: Radiation Oncology | Admitting: Radiation Oncology

## 2017-04-30 DIAGNOSIS — C20 Malignant neoplasm of rectum: Secondary | ICD-10-CM

## 2017-04-30 DIAGNOSIS — Z803 Family history of malignant neoplasm of breast: Secondary | ICD-10-CM

## 2017-04-30 DIAGNOSIS — Z510 Encounter for antineoplastic radiation therapy: Secondary | ICD-10-CM | POA: Diagnosis not present

## 2017-04-30 LAB — COMPREHENSIVE METABOLIC PANEL
ALBUMIN: 3.7 g/dL (ref 3.5–5.0)
ALK PHOS: 69 U/L (ref 40–150)
ALT: 12 U/L (ref 0–55)
AST: 13 U/L (ref 5–34)
Anion Gap: 5 mEq/L (ref 3–11)
BUN: 8.1 mg/dL (ref 7.0–26.0)
CALCIUM: 10.4 mg/dL (ref 8.4–10.4)
CO2: 27 mEq/L (ref 22–29)
CREATININE: 0.7 mg/dL (ref 0.6–1.1)
Chloride: 106 mEq/L (ref 98–109)
EGFR: >90 mL/min/{1.73_m2} (ref >90)
GLUCOSE: 95 mg/dL (ref 70–140)
POTASSIUM: 3.8 meq/L (ref 3.5–5.1)
SODIUM: 138 meq/L (ref 136–145)
Total Bilirubin: 0.42 mg/dL (ref 0.20–1.20)
Total Protein: 6.9 g/dL (ref 6.4–8.3)

## 2017-04-30 NOTE — Progress Notes (Signed)
REFERRING PROVIDER: Perkins, Alison Claire, PA-C [address] [place], [place] [address]  PRIMARY PROVIDER:  Patient, No Pcp Per  PRIMARY REASON FOR VISIT:  1. Rectal cancer (HCC)   2. Family history of breast cancer      HISTORY OF PRESENT ILLNESS:   Ms. Gradillas, a 41 y.o. female, was seen for a [facility] cancer genetics consultation at the request of Dr. Perkins due to a personal history of rectal cancer.  Ms. Wattenbarger presents to clinic today to discuss the possibility of a hereditary predisposition to cancer, genetic testing, and to further clarify her future cancer risks, as well as potential cancer risks for family members.   In 2018, at the age of 41, Ms. Newcombe was diagnosed with Adenocarcinoma of the rectum. She is currently undergoing neoadjuvant radiation.   HORMONAL RISK FACTORS:  Menarche was at age 13.  Ovaries intact: yes.  Hysterectomy: yes.  Menopausal status: premenopausal.  HRT use: 0 years. Colonoscopy: yes; abnormal.  Past Medical History:  Diagnosis Date  . Colon cancer (HCC)   . Family history of breast cancer   . HTN (hypertension)   . Migraine     Past Surgical History:  Procedure Laterality Date  . ABDOMINAL HYSTERECTOMY     due to uterine fibroids  . FLEXIBLE SIGMOIDOSCOPY N/A 04/12/2017   Procedure: FLEXIBLE SIGMOIDOSCOPY;  Surgeon: Jacobs, Daniel P, MD;  Location: [facility];  Service: Endoscopy;  Laterality: N/A;  . TONSILLECTOMY    . TUBAL LIGATION      Social History   Social History  . Marital status: Single    Spouse name: N/A  . Number of children: N/A  . Years of education: N/A   Social History Main Topics  . Smoking status: Never Smoker  . Smokeless tobacco: Never Used  . Alcohol use Yes     Comment: in social settings  . Drug use: No  . Sexual activity: Yes   Other Topics Concern  . None   Social History Narrative  . None     FAMILY HISTORY:  We obtained a detailed, 4-generation family history.  Significant  diagnoses are listed below: Family History  Problem Relation Age of Onset  . Breast cancer Other 75   Ms. Zell has 2 sons ages 20 and 14 and a 17 year-old daughter with no history of cancer.  Ms. Verbrugge has 2 paternal half-brothers (ages 45 and 36) as well as 1 paternal half-sister who is is 48 with no history of cancer. Ms. Northrup has 2 maternal half-sisters ages 50 and 47 with no history of cancer.   Ms. Sokoloski's father is 66 with no history of cancer.  He was adopted and has no information about his family history.   Ms. Pleitez's mother died at 65 and did not have a history of cancer. Ms. Pulaski has 1 maternal uncle who died at 60 and had no history of cancer.  This uncle had children, none with any history of cancer.  Ms. Rua's maternal grandfather died younger than 50 and had no history of cancer.  Ms. Broy's maternal grandmother died in her 60's with no history of cancer.  This grandmother had a sister diagnosed with breast cancer in her 70's.  Ms. Burger is unaware of previous family history of genetic testing for hereditary cancer risks. Patient's maternal ancestors are of African American descent, and paternal ancestors are of African American descent. There is no  reported Ashkenazi Jewish ancestry. There is no known consanguinity.    GENETIC COUNSELING ASSESSMENT: Danylle Friddle is a 41 y.o. female with a personal history which is somewhat suggestive of a Hereditary Cancer Predisposition Syndrome. We, therefore, discussed and recommended the following at today's visit.   DISCUSSION: We reviewed the characteristics, features and inheritance patterns of hereditary cancer syndromes. We also discussed genetic testing, including the appropriate family members to test, the process of testing, insurance coverage and turn-around-time for results. We discussed the implications of a negative, positive and/or variant of uncertain significant result. We recommended Ms. Mione  pursue genetic testing for the Common Hereditary Cancer gene panel. The Hereditary Gene Panel offered by [facility] includes sequencing and/or deletion duplication testing of the following 46 genes: APC, ATM, AXIN2, BARD1, BMPR1A, BRCA1, BRCA2, BRIP1, CDH1, CDKN2A (p14ARF), CDKN2A (p16INK4a), CHEK2, CTNNA1, DICER1, EPCAM (Deletion/duplication testing only), GREM1 (promoter region deletion/duplication testing only), KIT, MEN1, MLH1, MSH2, MSH3, MSH6, MUTYH, NBN, NF1, NHTL1, PALB2, PDGFRA, PMS2, POLD1, POLE, PTEN, RAD50, RAD51C, RAD51D, SDHB, SDHC, SDHD, SMAD4, SMARCA4. STK11, TP53, TSC1, TSC2, and VHL.  The following genes were evaluated for sequence changes only: SDHA and HOXB13 c.251G>A variant only.  We discussed that only 5-10% of cancers are associated with a Hereditary Cancer Predisposition Syndrome.  The most common hereditary cancer syndrome associated with colon cancer is Lynch Syndrome.  Lynch Syndrome is caused by mutations in the genes: MLH1, MSH2, MSH6, PMS2 and EPCAM.  This syndrome increases the risk for colon, uterine, ovarian and stomach cancers, as well as others.  Families with Lynch Syndrome tend to have multiple family members with these cancers, typically diagnosed under age 50, and diagnoses in multiple generations.    We discussed that there are several other genes that are associated with an increased risk for colon cancer and increased polyp burden (MUTYH, APC, POLE, CHEK2, etc.) We also dicussed that there are many genes that cause many different types of cancer risks.    We discussed that if she is found to have a mutation in one of these genes, it may impact future medical management recommendations such as increased cancer screenings and consideration of risk reducing surgeries.  A positive result could also have implications for the patient's family members.  A Negative result would mean we were unable to identify a hereditary component to her cancer, but does not rule out the  possibility of a hereditary basis for her cancer.  There could be mutations that are undetectable by current technology, or in genes not yet tested or identified to increase cancer risk.    We discussed the potential to find a Variant of Uncertain Significance or VUS.  These are variants that have not yet been identified as pathogenic or benign, and it is unknown if this variant is associated with increased cancer risk or if this is a normal finding.  Most VUS's are reclassified to benign or likely benign.   It should not be used to make medical management decisions. With time, we suspect the lab will determine the significance of any VUS's identified if any.   Based on Ms. Ourada's personal history of cancer, she meets medical criteria for genetic testing. Despite that she meets criteria, she may still have an out of pocket cost. We discussed that if her out of pocket cost for testing is over $100, the laboratory will call and confirm whether she wants to proceed with testing.  If the out of pocket cost of testing is less than $100 she will be billed by the genetic testing laboratory.   PLAN: After   considering the risks, benefits, and limitations, Ms. Glosser  provided informed consent to pursue genetic testing and the blood sample was sent to [facility] for analysis of the Common Hereditary Cancer Panel. Results should be available within approximately 2-3 weeks' time, at which point they will be disclosed by telephone to Ms. Frechette, as will any additional recommendations warranted by these results. Ms. Brasil will receive a summary of her genetic counseling visit and a copy of her results once available. This information will also be available in Epic. We encouraged Ms. Gilani to remain in contact with cancer genetics annually so that we can continuously update the family history and inform her of any changes in cancer genetics and testing that may be of benefit for her family. Ms.  Theriault questions were answered to her satisfaction today. Our contact information was provided should additional questions or concerns arise.  We recommended that even if genetic testing is negative, Ms. Zern's children should have colonoscopies starting at 23.  Her siblings should inform their doctors of the family history of colon cancer and may also be recommended to have colonoscopies early.     Lastly, we encouraged Ms. Joplin to remain in contact with cancer genetics annually so that we can continuously update the family history and inform her of any changes in cancer genetics and testing that may be of benefit for this family.   We discussed that some people do not want to undergo genetic testing due to fear of genetic discrimination.  A federal law called the Genetic Information Non-Discrimination Act (GINA) of 2008 helps protect individuals against genetic discrimination based on their genetic test results.  It impacts both health insurance and employment.  For health insurance, it protects against increased premiums, being kicked off insurance or being forced to take a test in order to be insured.  For employment it protects against hiring, firing and promoting decisions based on genetic test results.  Health status due to a cancer diagnosis is not protected under GINA.  This law does not protect life insurance, disability insurance, or other types of insurance.   Ms.  Fini questions were answered to her satisfaction today. Our contact information was provided should additional questions or concerns arise. Thank you for the referral and allowing [place] to share in the care of your patient.   Tana Felts, MS Genetic Counselor [email or web address] phone: [phone]  The patient was seen for a total of 30 minutes in face-to-face genetic counseling.

## 2017-05-01 ENCOUNTER — Ambulatory Visit
Admission: RE | Admit: 2017-05-01 | Discharge: 2017-05-01 | Disposition: A | Discharge status: Home / Self Care | Payer: BLUE CROSS/BLUE SHIELD | Source: Ambulatory Visit | Attending: Radiation Oncology | Admitting: Radiation Oncology

## 2017-05-01 ENCOUNTER — Encounter: Payer: Self-pay | Admitting: *Deleted

## 2017-05-01 ENCOUNTER — Other Ambulatory Visit: Payer: Self-pay | Admitting: *Deleted

## 2017-05-01 DIAGNOSIS — C20 Malignant neoplasm of rectum: Secondary | ICD-10-CM

## 2017-05-01 DIAGNOSIS — Z510 Encounter for antineoplastic radiation therapy: Secondary | ICD-10-CM | POA: Diagnosis not present

## 2017-05-01 NOTE — Progress Notes (Signed)
[place] Psychosocial Distress Screening Clinical Social Work  Clinical Social Work was referred by distress screening protocol.  The patient scored a 7 on the Psychosocial Distress Thermometer which indicates moderate distress. Clinical Social Worker contacted patient at home to assess for distress and other psychosocial needs.  Patient stated she was "doing her best to work through treatment side effects", and was not expecting to experience them so soon into the treatment process.  CSW validated patients concerns and encouraged her to share concerns with the GI navigator and medical oncologist.  [place] and patient discussed the support team and support services at [facility].  Patient expressed interest in the GI support group and plans to attend the next meeting.  CSW and patient also discussed support services for her children.  CSW shared [facility] information and patient plans to contact.  CSW provided contact information and encouraged patient to call with needs or concerns.         ONCBCN DISTRESS SCREENING 04/12/2017  Screening Type Initial Screening  Distress experienced in past week (1-10) 7  Emotional problem type Depression;Nervousness/Anxiety;Adjusting to illness;Isolation/feeling alone  Physical Problem type Pain;Constipation/diarrhea    Johnnye Lana, MSW, LCSW, OSW-C Clinical Social Worker [facility] [phone]

## 2017-05-02 ENCOUNTER — Ambulatory Visit
Admission: RE | Admit: 2017-05-02 | Discharge: 2017-05-02 | Disposition: A | Discharge status: Home / Self Care | Payer: BLUE CROSS/BLUE SHIELD | Source: Ambulatory Visit | Attending: Radiation Oncology | Admitting: Radiation Oncology

## 2017-05-02 DIAGNOSIS — Z510 Encounter for antineoplastic radiation therapy: Secondary | ICD-10-CM | POA: Diagnosis not present

## 2017-05-03 ENCOUNTER — Ambulatory Visit
Admission: RE | Admit: 2017-05-03 | Discharge: 2017-05-03 | Disposition: A | Discharge status: Home / Self Care | Payer: BLUE CROSS/BLUE SHIELD | Source: Ambulatory Visit | Attending: Radiation Oncology | Admitting: Radiation Oncology

## 2017-05-03 DIAGNOSIS — Z510 Encounter for antineoplastic radiation therapy: Secondary | ICD-10-CM | POA: Diagnosis not present

## 2017-05-04 ENCOUNTER — Ambulatory Visit
Admission: RE | Admit: 2017-05-04 | Discharge: 2017-05-04 | Disposition: A | Discharge status: Home / Self Care | Payer: BLUE CROSS/BLUE SHIELD | Source: Ambulatory Visit | Attending: Radiation Oncology | Admitting: Radiation Oncology

## 2017-05-04 DIAGNOSIS — Z510 Encounter for antineoplastic radiation therapy: Secondary | ICD-10-CM | POA: Diagnosis not present

## 2017-05-07 ENCOUNTER — Other Ambulatory Visit (HOSPITAL_BASED_OUTPATIENT_CLINIC_OR_DEPARTMENT_OTHER): Payer: BLUE CROSS/BLUE SHIELD

## 2017-05-07 ENCOUNTER — Telehealth: Payer: Self-pay | Admitting: Oncology

## 2017-05-07 ENCOUNTER — Ambulatory Visit
Admission: RE | Admit: 2017-05-07 | Discharge: 2017-05-07 | Disposition: A | Discharge status: Home / Self Care | Payer: BLUE CROSS/BLUE SHIELD | Source: Ambulatory Visit | Attending: Radiation Oncology | Admitting: Radiation Oncology

## 2017-05-07 ENCOUNTER — Ambulatory Visit (HOSPITAL_BASED_OUTPATIENT_CLINIC_OR_DEPARTMENT_OTHER): Payer: BLUE CROSS/BLUE SHIELD | Admitting: Nurse Practitioner

## 2017-05-07 VITALS — BP 129/84 | HR 77 | Temp 98.2°F | Resp 17 | Ht 67.0 in | Wt 197.2 lb

## 2017-05-07 DIAGNOSIS — R110 Nausea: Secondary | ICD-10-CM | POA: Diagnosis not present

## 2017-05-07 DIAGNOSIS — Z510 Encounter for antineoplastic radiation therapy: Secondary | ICD-10-CM | POA: Diagnosis not present

## 2017-05-07 DIAGNOSIS — Z23 Encounter for immunization: Secondary | ICD-10-CM

## 2017-05-07 DIAGNOSIS — C20 Malignant neoplasm of rectum: Secondary | ICD-10-CM | POA: Diagnosis not present

## 2017-05-07 DIAGNOSIS — R530 Neoplastic (malignant) related fatigue: Secondary | ICD-10-CM

## 2017-05-07 DIAGNOSIS — R109 Unspecified abdominal pain: Secondary | ICD-10-CM

## 2017-05-07 LAB — CBC WITH DIFFERENTIAL/PLATELET
BASO%: 0.4 % (ref 0.0–2.0)
Basophils Absolute: 0 10*3/uL (ref 0.0–0.1)
EOS%: 2.1 % (ref 0.0–7.0)
Eosinophils Absolute: 0.1 10*3/uL (ref 0.0–0.5)
HEMATOCRIT: 34.3 % — AB (ref 34.8–46.6)
HGB: 11.6 g/dL (ref 11.6–15.9)
LYMPH#: 0.4 10*3/uL — AB (ref 0.9–3.3)
LYMPH%: 18.1 % (ref 14.0–49.7)
MCH: 27 pg (ref 25.1–34.0)
MCHC: 33.8 g/dL (ref 31.5–36.0)
MCV: 80 fL (ref 79.5–101.0)
MONO#: 0.1 10*3/uL (ref 0.1–0.9)
MONO%: 4.9 % (ref 0.0–14.0)
NEUT#: 1.8 10*3/uL (ref 1.5–6.5)
NEUT%: 74.5 % (ref 38.4–76.8)
Platelets: 137 10*3/uL — ABNORMAL LOW (ref 145–400)
RBC: 4.29 10*6/uL (ref 3.70–5.45)
RDW: 12.9 % (ref 11.2–14.5)
WBC: 2.4 10*3/uL — ABNORMAL LOW (ref 3.9–10.3)

## 2017-05-07 MED ORDER — PROCHLORPERAZINE MALEATE 10 MG PO TABS: 10.0000 mg | ORAL_TABLET | Freq: Four times a day (QID) | ORAL | 0 refills | Status: DC | PRN

## 2017-05-07 MED ORDER — INFLUENZA VAC SPLIT QUAD 0.5 ML IM SUSY
0.5000 mL | PREFILLED_SYRINGE | Freq: Once | INTRAMUSCULAR | Status: AC
  Administered 2017-05-07: 0.5 mL via INTRAMUSCULAR
  Filled 2017-05-07: qty 0.5

## 2017-05-07 NOTE — Progress Notes (Signed)
  [place] OFFICE PROGRESS NOTE   Diagnosis:  Rectal cancer  INTERVAL HISTORY:   Donna Brandt returns as scheduled. She began radiation and concurrent Xeloda 04/23/2017. She is having nausea. She attributes this to Xeloda. She did not have significant nausea yesterday. She has progressive fatigue and more abdominal pain. She notes the abdomen is very sensitive to any pressure including clothes. She has intermittent loose stools. Rectal bleeding is better. No hand or foot pain or redness. No mouth sores.  Objective:  Vital signs in last 24 hours:  Blood pressure 129/84, pulse 77, temperature 98.2 F (36.8 C), temperature source Oral, resp. rate 17, height 5\' 7"  (1.702 m), weight 197 lb 3.2 oz (89.4 kg), SpO2 100 %.    HEENT: No thrush or ulcers. Resp: Lungs clear bilaterally. Cardio: Regular rate and rhythm. GI: Abdomen is soft. Tender over the upper abdomen. Nondistended. No hepatomegaly. Vascular: No leg edema. Skin: Palms with mild hyperpigmentation. No erythema. No skin breakdown.    Lab Results:  Lab Results  Component Value Date   WBC 2.4 (L) 05/07/2017   HGB 11.6 05/07/2017   HCT 34.3 (L) 05/07/2017   MCV 80.0 05/07/2017   PLT 137 (L) 05/07/2017   NEUTROABS 1.8 05/07/2017    Imaging:  No results found.  Medications: I have reviewed the patient's current medications.  Assessment/Plan: 1. Rectal cancer, clinical stage T3b,N0,M0  colonoscopy 03/06/2017-biopsy of a rectal mass revealed fragments of an adenomatous lesion with high-grade dysplasia, definite submucosa not available to evaluate for invasion  Proctoscopy/biopsy 03/23/2017 confirmed a mass at 6-7 centimeters from the anal verge, biopsy revealed polypoid colorectal mucosa with detached fragments of adenomatous mucosa  Staging CTs 03/16/2017, anterior rectal mass, no evidence of metastatic disease, no adenopathy,  MRI 03/21/2017- T3b,N0 rectal tumor  Initiation of radiation and  concurrent Xeloda 04/23/2017.  2.   Hypertension  3.   Depression   Disposition: Donna Brandt appears stable. She continues radiation and concurrent Xeloda. She is having nausea intermittently. Zofran provides partial relief. She will try Compazine 10 mg every 6 hours as needed and contact the office if this is not effective.  She will return for a follow-up visit in 2 weeks with labs.   At today's visit she indicates she would like to see a Psychologist, sport and exercise in [place]. We made a referral to Dr. Annye English.  She will receive the influenza vaccine today.  Plan reviewed with Dr. Benay Spice.    Donna Brandt ANP/GNP-BC   05/07/2017  9:53 AM

## 2017-05-07 NOTE — Addendum Note (Signed)
Addended by: Jethro Bolus A on: 05/07/2017 10:22 AM   Modules accepted: Orders

## 2017-05-07 NOTE — Telephone Encounter (Signed)
Gave avs and calendar for October  °

## 2017-05-08 ENCOUNTER — Ambulatory Visit
Admission: RE | Admit: 2017-05-08 | Discharge: 2017-05-08 | Disposition: A | Discharge status: Home / Self Care | Payer: BLUE CROSS/BLUE SHIELD | Source: Ambulatory Visit | Attending: Radiation Oncology | Admitting: Radiation Oncology

## 2017-05-08 ENCOUNTER — Encounter: Payer: Self-pay | Admitting: Genetics

## 2017-05-08 ENCOUNTER — Ambulatory Visit: Payer: Self-pay | Admitting: Genetics

## 2017-05-08 ENCOUNTER — Telehealth: Payer: Self-pay | Admitting: Genetics

## 2017-05-08 DIAGNOSIS — Z1379 Encounter for other screening for genetic and chromosomal anomalies: Secondary | ICD-10-CM

## 2017-05-08 DIAGNOSIS — C20 Malignant neoplasm of rectum: Secondary | ICD-10-CM

## 2017-05-08 DIAGNOSIS — Z510 Encounter for antineoplastic radiation therapy: Secondary | ICD-10-CM | POA: Diagnosis not present

## 2017-05-08 DIAGNOSIS — Z803 Family history of malignant neoplasm of breast: Secondary | ICD-10-CM

## 2017-05-08 NOTE — Telephone Encounter (Signed)
Revealed negative genetic testing.  Discussed that we do not know why she has cancer.  This is reassuring that it not likely to be hereditary, however it could be due to a different gene that we are not testing, or maybe our current technology may not be able to pick something up.  It will be important for her to keep in contact with genetics to keep up with whether additional testing may be needed. Patient indicated that she will stop by my office this morning to pick up a copy of her results.

## 2017-05-08 NOTE — Progress Notes (Signed)
HPI: Donna Brandt was previously seen in the [place] clinic on 04/30/2017 due to a personal history of rectal cancer and concerns regarding a hereditary predisposition to cancer. Please refer to our prior cancer genetics clinic note for more information regarding Donna Brandt's medical, social and family histories, and our assessment and recommendations, at the time. Donna Brandt recent genetic test results were disclosed to her, as well as recommendations warranted by these results. These results and recommendations are discussed in more detail below.  CANCER HISTORY:    Adenocarcinoma of rectum ([place])   04/10/2017 Initial Diagnosis    Adenocarcinoma of rectum ([place])     05/06/2017 Genetic Testing    The patient had genetic testing due to a personal history of rectal cancer at 19.  The common hereditary cancer panel was ordered. The Hereditary Gene Panel offered by [facility] includes sequencing and/or deletion duplication testing of the following 46 genes: APC, ATM, AXIN2, BARD1, BMPR1A, BRCA1, BRCA2, BRIP1, CDH1, CDKN2A (p14ARF), CDKN2A (p16INK4a), CHEK2, CTNNA1, DICER1, EPCAM (Deletion/duplication testing only), GREM1 (promoter region deletion/duplication testing only), KIT, MEN1, MLH1, MSH2, MSH3, MSH6, MUTYH, NBN, NF1, NHTL1, PALB2, PDGFRA, PMS2, POLD1, POLE, PTEN, RAD50, RAD51C, RAD51D, SDHB, SDHC, SDHD, SMAD4, SMARCA4. STK11, TP53, TSC1, TSC2, and VHL.  The following genes were evaluated for sequence changes only: SDHA and HOXB13 c.251G>A variant only.  Results: Negative- No pathogenic variants identified.  The date of this test report is 05/06/2017.        FAMILY HISTORY:  We obtained a detailed, 4-generation family history.  Significant diagnoses are listed below: Family History  Problem Relation Age of Onset  . Breast cancer Other 77   Donna Brandt has 2 sons ages 22 and 42 and a 86 year-old daughter with no history of cancer.  Donna Brandt has 2 paternal half-brothers  (ages 48 and 77) as well as 1 paternal half-sister who is is 34 with no history of cancer. Donna Brandt has 2 maternal half-sisters ages 27 and 31 with no history of cancer.   Donna Brandt father is 49 with no history of cancer.  He was adopted and has no information about his family history.   Donna Brandt mother died at 73 and did not have a history of cancer. Donna Brandt has 1 maternal uncle who died at 25 and had no history of cancer.  This uncle had children, none with any history of cancer.  Donna Brandt maternal grandfather died younger than 89 and had no history of cancer.  Donna Brandt maternal grandmother died in her 18's with no history of cancer.  This grandmother had a sister diagnosed with breast cancer in her 27's.  Donna Brandt is unaware of previous family history of genetic testing for hereditary cancer risks. Patient's maternal ancestors are of African American descent, and paternal ancestors are of African American descent. There is no  reported Ashkenazi Jewish ancestry. There is no known consanguinity.  GENETIC TEST RESULTS: Genetic testing performed through [facility]'s Common Hereditary Cancer Panel reported out on 05/06/2017 showed no pathogenic mutations. The Hereditary Gene Panel offered by [facility] includes sequencing and/or deletion duplication testing of the following 46 genes: APC, ATM, AXIN2, BARD1, BMPR1A, BRCA1, BRCA2, BRIP1, CDH1, CDKN2A (p14ARF), CDKN2A (p16INK4a), CHEK2, CTNNA1, DICER1, EPCAM (Deletion/duplication testing only), GREM1 (promoter region deletion/duplication testing only), KIT, MEN1, MLH1, MSH2, MSH3, MSH6, MUTYH, NBN, NF1, NHTL1, PALB2, PDGFRA, PMS2, POLD1, POLE, PTEN, RAD50, RAD51C, RAD51D, SDHB, SDHC, SDHD, SMAD4, SMARCA4. STK11, TP53, TSC1, TSC2, and VHL.  The following  genes were evaluated for sequence changes only: SDHA and HOXB13 c.251G>A variant only..  The test report will be scanned into EPIC and will be located under the Molecular  Pathology section of the Results Review tab.A portion of the result report is included below for reference.     We discussed with Donna Brandt that because current genetic testing is not perfect, it is possible there may be a gene mutation in one of these genes that current testing cannot detect, but that chance is small. We also discussed, that there could be another gene that has not yet been discovered, or that we have not yet tested, that is responsible for the cancer diagnoses in the family. Therefore, it is important to remain in touch with cancer genetics in the future so that we can continue to offer Donna Brandt the most up to date genetic testing.   ADDITIONAL GENETIC TESTING: We discussed with Donna Brandt that there are other genes that are associated with increased cancer risk that can be analyzed. The laboratories that offer this testing look at these additional genes via a hereditary cancer gene panel. Should Donna Brandt wish to pursue additional genetic testing, we are happy to discuss and coordinate this testing, at any time.    CANCER SCREENING RECOMMENDATIONS: This normal result is  reassuring and indicates that it is unlikely Donna Brandt has an increased risk of cancer due to a mutation in one of these genes.  Therefore, Donna Brandt was advised to continue following the cancer screening guidelines provided by her primary healthcare providers. Other factors such as her personal and family history may still affect her cancer risk.    RECOMMENDATIONS FOR FAMILY MEMBERS: Women in this family might be at some increased risk of developing cancer, over the general population risk, simply due to the family history of cancer. We recommended women in this family have a yearly mammogram beginning at age 106, or 71 years younger than the earliest onset of cancer, an annual clinical breast exam, and perform monthly breast self-exams. Women in this family should also have a gynecological exam  as recommended by their primary provider. All individuals should have a colonoscopy by age 46.  However, we recommend that Ms. Salmela's children start having colonoscopies at the age of 64 and repeat every 5 years or as directed by their doctors.   Ms. Angell half-siblings should also inform their doctors about the family history of colon cancer and may be recommended to have additional colon screening as well.    FOLLOW-UP: Lastly, we discussed with Ms. Tercero that cancer genetics is a rapidly advancing field and it is possible that new genetic tests will be appropriate for her and/or her family members in the future. We encouraged her to remain in contact with cancer genetics on an annual basis so we can update her personal and family histories and let her know of advances in cancer genetics that may benefit this family.   Our contact number was provided. Ms. Derhammer questions were answered to her satisfaction, and she knows she is welcome to call us at anytime with additional questions or concerns.   Ferol Luz, MS Genetic Counselor [email or web address]

## 2017-05-09 ENCOUNTER — Ambulatory Visit
Admission: RE | Admit: 2017-05-09 | Discharge: 2017-05-09 | Disposition: A | Discharge status: Home / Self Care | Payer: BLUE CROSS/BLUE SHIELD | Source: Ambulatory Visit | Attending: Radiation Oncology | Admitting: Radiation Oncology

## 2017-05-09 DIAGNOSIS — Z510 Encounter for antineoplastic radiation therapy: Secondary | ICD-10-CM | POA: Diagnosis not present

## 2017-05-10 ENCOUNTER — Ambulatory Visit
Admission: RE | Admit: 2017-05-10 | Discharge: 2017-05-10 | Disposition: A | Discharge status: Home / Self Care | Payer: BLUE CROSS/BLUE SHIELD | Source: Ambulatory Visit | Attending: Radiation Oncology | Admitting: Radiation Oncology

## 2017-05-10 DIAGNOSIS — Z510 Encounter for antineoplastic radiation therapy: Secondary | ICD-10-CM | POA: Diagnosis not present

## 2017-05-11 ENCOUNTER — Ambulatory Visit
Admission: RE | Admit: 2017-05-11 | Discharge: 2017-05-11 | Disposition: A | Discharge status: Home / Self Care | Payer: BLUE CROSS/BLUE SHIELD | Source: Ambulatory Visit | Attending: Radiation Oncology | Admitting: Radiation Oncology

## 2017-05-11 DIAGNOSIS — Z510 Encounter for antineoplastic radiation therapy: Secondary | ICD-10-CM | POA: Diagnosis not present

## 2017-05-14 ENCOUNTER — Other Ambulatory Visit: Payer: Self-pay | Admitting: Radiation Oncology

## 2017-05-14 ENCOUNTER — Ambulatory Visit
Admission: RE | Admit: 2017-05-14 | Discharge: 2017-05-14 | Disposition: A | Discharge status: Home / Self Care | Payer: BLUE CROSS/BLUE SHIELD | Source: Ambulatory Visit | Attending: Radiation Oncology | Admitting: Radiation Oncology

## 2017-05-14 DIAGNOSIS — C20 Malignant neoplasm of rectum: Secondary | ICD-10-CM

## 2017-05-14 DIAGNOSIS — Z510 Encounter for antineoplastic radiation therapy: Secondary | ICD-10-CM | POA: Diagnosis not present

## 2017-05-14 LAB — URINALYSIS, MICROSCOPIC - CHCC
BILIRUBIN (URINE): NEGATIVE
Glucose: NEGATIVE mg/dL
Ketones: NEGATIVE mg/dL
LEUKOCYTE ESTERASE: NEGATIVE
Nitrite: NEGATIVE
Protein: NEGATIVE mg/dL
SPECIFIC GRAVITY, URINE: 1.015 (ref 1.003–1.035)
Urobilinogen, UR: 0.2 mg/dL (ref 0.2–1)
pH: 6 (ref 4.6–8.0)

## 2017-05-15 ENCOUNTER — Telehealth: Payer: Self-pay | Admitting: *Deleted

## 2017-05-15 ENCOUNTER — Ambulatory Visit
Admission: RE | Admit: 2017-05-15 | Discharge: 2017-05-15 | Disposition: A | Discharge status: Home / Self Care | Payer: BLUE CROSS/BLUE SHIELD | Source: Ambulatory Visit | Attending: Radiation Oncology | Admitting: Radiation Oncology

## 2017-05-15 DIAGNOSIS — Z510 Encounter for antineoplastic radiation therapy: Secondary | ICD-10-CM | POA: Diagnosis not present

## 2017-05-15 DIAGNOSIS — C20 Malignant neoplasm of rectum: Secondary | ICD-10-CM

## 2017-05-15 LAB — URINE CULTURE

## 2017-05-15 NOTE — Telephone Encounter (Signed)
Returned call to pt, informed her urinary frequency is a side effect of her radiation treatment. Continue to push fluids, discuss with treatment team on 10/10 visit. Pt reports feeling extremely weak. She is having 3 loose stools per day. She is requesting labs to check for dehydration. Reviewed with Ned Card, NP: Order received for labs, bring pt in for symptom management visit 10/10. Informed pt, scheduling message sent.

## 2017-05-15 NOTE — Telephone Encounter (Signed)
Call received from "caregiver, Boykin Nearing, basically her boyfriend.  She is very weak.  Unable to go to work.  do I need to bring her in?  Every time she drinks she has to go to the bathroom.  Maybe she's dehydrated, needs an IV.  I've been gone a while so I do not know how much she's had to drink.  Has not been well since this mornings radiation." Spoke with patient who reports "I gave a urine sample yesterday waiting for results.  Today I drank two 16 oz water bottles and some orange juice.  Within ten minutes I have to pee.  Temp checked with this call = 97.0 .  Problems urinating started Sunday.  Started AZO so urine is orange in color.  I have not had any sexual intercourse from Saturday through today.  I do not have a menstrual cycle.  I had a loose BM this morning and another after eating breakfast.  No loose BM's until today."    Return number [phone].  Will  Notify provider.  Routing call information to collaborative nurse and provider for review.  Further patient communication through collaborative nurse.

## 2017-05-16 ENCOUNTER — Other Ambulatory Visit (HOSPITAL_BASED_OUTPATIENT_CLINIC_OR_DEPARTMENT_OTHER): Payer: BLUE CROSS/BLUE SHIELD

## 2017-05-16 ENCOUNTER — Telehealth: Payer: Self-pay | Admitting: Medical

## 2017-05-16 ENCOUNTER — Ambulatory Visit
Admission: RE | Admit: 2017-05-16 | Discharge: 2017-05-16 | Disposition: A | Discharge status: Home / Self Care | Payer: BLUE CROSS/BLUE SHIELD | Source: Ambulatory Visit | Attending: Radiation Oncology | Admitting: Radiation Oncology

## 2017-05-16 ENCOUNTER — Ambulatory Visit (HOSPITAL_BASED_OUTPATIENT_CLINIC_OR_DEPARTMENT_OTHER): Payer: BLUE CROSS/BLUE SHIELD | Admitting: Medical

## 2017-05-16 VITALS — BP 116/81 | HR 72 | Temp 98.4°F | Resp 18 | Ht 67.0 in | Wt 194.8 lb

## 2017-05-16 DIAGNOSIS — C20 Malignant neoplasm of rectum: Secondary | ICD-10-CM | POA: Diagnosis not present

## 2017-05-16 DIAGNOSIS — R197 Diarrhea, unspecified: Secondary | ICD-10-CM | POA: Diagnosis not present

## 2017-05-16 DIAGNOSIS — R300 Dysuria: Secondary | ICD-10-CM | POA: Diagnosis not present

## 2017-05-16 DIAGNOSIS — N3040 Irradiation cystitis without hematuria: Secondary | ICD-10-CM

## 2017-05-16 DIAGNOSIS — Z510 Encounter for antineoplastic radiation therapy: Secondary | ICD-10-CM | POA: Diagnosis not present

## 2017-05-16 LAB — COMPREHENSIVE METABOLIC PANEL
ALT: 19 U/L (ref 0–55)
AST: 16 U/L (ref 5–34)
Albumin: 3.9 g/dL (ref 3.5–5.0)
Alkaline Phosphatase: 76 U/L (ref 40–150)
Anion Gap: 7 mEq/L (ref 3–11)
BILIRUBIN TOTAL: 0.62 mg/dL (ref 0.20–1.20)
BUN: 11.4 mg/dL (ref 7.0–26.0)
CO2: 25 meq/L (ref 22–29)
CREATININE: 0.8 mg/dL (ref 0.6–1.1)
Calcium: 10.3 mg/dL (ref 8.4–10.4)
Chloride: 107 mEq/L (ref 98–109)
EGFR: >60 mL/min/{1.73_m2} (ref >60)
GLUCOSE: 113 mg/dL (ref 70–140)
Potassium: 3.9 mEq/L (ref 3.5–5.1)
SODIUM: 140 meq/L (ref 136–145)
TOTAL PROTEIN: 7.4 g/dL (ref 6.4–8.3)

## 2017-05-16 LAB — CBC WITH DIFFERENTIAL/PLATELET
BASO%: 0.4 % (ref 0.0–2.0)
BASOS ABS: 0 10*3/uL (ref 0.0–0.1)
EOS ABS: 0.1 10*3/uL (ref 0.0–0.5)
EOS%: 3.7 % (ref 0.0–7.0)
HCT: 35.9 % (ref 34.8–46.6)
HEMOGLOBIN: 12 g/dL (ref 11.6–15.9)
LYMPH%: 11.1 % — AB (ref 14.0–49.7)
MCH: 26.8 pg (ref 25.1–34.0)
MCHC: 33.4 g/dL (ref 31.5–36.0)
MCV: 80.1 fL (ref 79.5–101.0)
MONO#: 0.3 10*3/uL (ref 0.1–0.9)
MONO%: 10.7 % (ref 0.0–14.0)
NEUT%: 74.1 % (ref 38.4–76.8)
NEUTROS ABS: 1.8 10*3/uL (ref 1.5–6.5)
NRBC: 0 % (ref 0–0)
Platelets: 128 10*3/uL — ABNORMAL LOW (ref 145–400)
RBC: 4.48 10*6/uL (ref 3.70–5.45)
RDW: 14 % (ref 11.2–14.5)
WBC: 2.4 10*3/uL — AB (ref 3.9–10.3)
lymph#: 0.3 10*3/uL — ABNORMAL LOW (ref 0.9–3.3)

## 2017-05-16 MED ORDER — IBUPROFEN 600 MG PO TABS: 600.0000 mg | ORAL_TABLET | Freq: Three times a day (TID) | ORAL | 0 refills | Status: DC

## 2017-05-16 MED ORDER — PHENAZOPYRIDINE HCL 100 MG PO TABS: 100.0000 mg | ORAL_TABLET | Freq: Three times a day (TID) | ORAL | 1 refills | Status: DC | PRN

## 2017-05-16 MED ORDER — OXYBUTYNIN CHLORIDE ER 5 MG PO TB24: 5.0000 mg | ORAL_TABLET | Freq: Every day | ORAL | 0 refills | Status: DC

## 2017-05-16 NOTE — Telephone Encounter (Signed)
Per 10/10 no los at check out °

## 2017-05-16 NOTE — Patient Instructions (Signed)
Stop AZO  Continue Imodium and increase to 4 times daily

## 2017-05-17 ENCOUNTER — Other Ambulatory Visit: Payer: Self-pay | Admitting: Oncology

## 2017-05-17 ENCOUNTER — Ambulatory Visit
Admission: RE | Admit: 2017-05-17 | Discharge: 2017-05-17 | Disposition: A | Discharge status: Home / Self Care | Payer: BLUE CROSS/BLUE SHIELD | Source: Ambulatory Visit | Attending: Radiation Oncology | Admitting: Radiation Oncology

## 2017-05-17 DIAGNOSIS — Z510 Encounter for antineoplastic radiation therapy: Secondary | ICD-10-CM | POA: Diagnosis not present

## 2017-05-17 DIAGNOSIS — C20 Malignant neoplasm of rectum: Secondary | ICD-10-CM

## 2017-05-17 NOTE — Progress Notes (Signed)
Symptoms Management Clinic Progress Note   Donna Brandt [id number] Oct 22, 1975 41 y.o.  Donna Brandt is managed by Dr. Dominica Severin B. 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 She was also instructed to push fluids and to add cranberry juice to her intake.  Diarrhea: The patient was instructed to increase her use of Imodium 2-4 times daily initially.  Please see After Visit Summary for patient specific instructions.  Future Appointments Date Time Provider [place]  05/18/2017 8:30 AM [facility] LINAC 3 [facility] None  05/21/2017 8:30 AM [facility] LINAC 3 [facility] None  05/22/2017 7:45 AM [facility] LAB 6 [facility] None  05/22/2017 8:00 AM Ladell Pier, MD [facility] None  05/22/2017 8:30 AM [facility] LINAC 3 [facility] None  05/23/2017 8:30 AM [facility] LINAC 3 [facility] None  05/24/2017 8:30 AM [facility] LINAC 3 [facility] None  05/25/2017 8:30 AM [facility] LINAC 3 [facility] None  05/28/2017 8:30 AM [facility] LINAC 3 [facility] None  05/29/2017 8:30 AM [facility] LINAC 3 [facility] None  05/30/2017 8:30 AM [facility] LINAC 3 [facility] None    No orders of the defined types were placed in this encounter.     Subjective:   Patient ID:  Donna Brandt is a 41 y.o. (DOB 1976/08/07) female.  Chief Complaint:  Chief Complaint  Patient presents with  . Dysuria    HPI Donna Brandt is a 41 year old female with a history of a T3b, N0, M0 rectal cancer. She  is currently are being treated with radiation and concurrent Xeloda which was initiated on 04/23/2017. She presents to the office today having completed 20/30 fractions of radiation therapy today. She has had significant dysuria since Monday. She has been taking over-the-counter Azo which has not been of benefit. She has noted blood in her urine. She states that she goes to the bathroom acutely after drinking any fluids. She has fatigue. She has diarrhea 3 times daily for the last 2 weeks. Her diarrhea is watery. She has been having fecal urgency. She is only been taking Imodium once daily. She denies fevers chills sweats or acute pain. She continues to work full time. She is on her feet most of the day.  Dysuria: Donna Brandt presents with: burning with urination, diarrhea, dysuria, frequency, hematuria and vomiting  She has had symptoms for 3 days.  She also complains of See above.  She denies back pain and fever.   Medications: I have reviewed the patient's current medications.  Allergies:  Allergies  Allergen Reactions  . Dilaudid [Hydromorphone Hcl] Itching  . Latex Rash    Skin sensitivity     Past Medical History:  Diagnosis Date  . Colon cancer ([place])   . Family history of breast cancer   . HTN (hypertension)   . Migraine     Past Surgical History:  Procedure Laterality Date  . ABDOMINAL HYSTERECTOMY     due to uterine fibroids  . FLEXIBLE SIGMOIDOSCOPY N/A 04/12/2017   Procedure: FLEXIBLE SIGMOIDOSCOPY;  Surgeon: Milus Banister, MD;  Location: [facility];  Service: Endoscopy;  Laterality: N/A;  . TONSILLECTOMY    . TUBAL LIGATION      Family History  Problem Relation Age of Onset  . Breast cancer Other 58    Social History   Social History  . Marital status: Single    Spouse name: N/A  . Number of children: N/A  . Years of education: N/A   Occupational History  . Not on file.   Social History Main Topics  . Smoking status: Never Smoker  . Smokeless  tobacco: Never Used  . Alcohol use Yes     Comment: in social settings  . Drug use: No  . Sexual activity: Yes   Other Topics Concern  . Not on file   Social History Narrative  . No narrative on file    Past Medical History, Surgical history, Social history, and Family history were reviewed and updated as appropriate.   Please see review of systems for further details on the patient's review from today.   Review of Systems:  Review of Systems  Constitutional: Negative for chills, diaphoresis and fever.  Gastrointestinal: Positive for diarrhea.  Genitourinary: Positive for dysuria, frequency, hematuria and urgency. Negative for decreased urine volume, difficulty urinating and flank pain.    Objective:   Physical Exam:  BP 116/81 (BP Location: Right Arm, Patient Position: Sitting)   Pulse 72   Temp 98.4 F (36.9 C) (Oral)   Resp 18   Ht 5\' 7"  (1.702 m)   Wt 194 lb 12.8 oz (88.4 kg)   SpO2 100%   BMI 30.51 kg/m  ECOG: 0  Physical Exam  Constitutional: No distress.  HENT:  Head: Normocephalic and atraumatic.  Cardiovascular: Normal rate, regular rhythm and normal heart sounds.  Exam reveals no gallop and no friction rub.   No murmur heard. Pulmonary/Chest: Effort normal and breath sounds normal. No respiratory distress. She has no wheezes. She has no rales.  Abdominal: Soft. Bowel sounds are normal. She exhibits no distension. There is no tenderness. There is no rebound and no guarding.  Musculoskeletal: She exhibits no edema.  Neurological: She is alert. Coordination normal.  Skin: Skin is warm and dry. She is not diaphoretic.  Psychiatric: She has a normal mood and affect. Her behavior is normal. Judgment and thought content normal.    Lab Review:     Component Value Date/Time   NA 140 05/16/2017 0906   K 3.9 05/16/2017 0906   CO2 25 05/16/2017 0906   GLUCOSE 113 05/16/2017 0906   BUN 11.4 05/16/2017 0906   CREATININE 0.8 05/16/2017 0906   CALCIUM 10.3  05/16/2017 0906   PROT 7.4 05/16/2017 0906   ALBUMIN 3.9 05/16/2017 0906   AST 16 05/16/2017 0906   ALT 19 05/16/2017 0906   ALKPHOS 76 05/16/2017 0906   BILITOT 0.62 05/16/2017 0906       Component Value Date/Time   WBC 2.4 (L) 05/16/2017 0906   RBC 4.48 05/16/2017 0906   HGB 12.0 05/16/2017 0906   HCT 35.9 05/16/2017 0906   PLT 128 (L) 05/16/2017 0906   MCV 80.1 05/16/2017 0906   MCH 26.8 05/16/2017 0906   MCHC 33.4 05/16/2017 0906   RDW 14.0 05/16/2017 0906   LYMPHSABS 0.3 (L) 05/16/2017 0906   MONOABS 0.3 05/16/2017 0906   EOSABS 0.1 05/16/2017 0906   BASOSABS 0.0 05/16/2017 0906   -------------------------------  Imaging from last 24 hours (if applicable):  Radiology interpretation: No results found.      This case  was discussed with Dr. Benay Spice. He expressed agreement with my management of this patient.

## 2017-05-18 ENCOUNTER — Other Ambulatory Visit: Payer: Self-pay | Admitting: Oncology

## 2017-05-18 ENCOUNTER — Ambulatory Visit
Admission: RE | Admit: 2017-05-18 | Discharge: 2017-05-18 | Disposition: A | Discharge status: Home / Self Care | Payer: BLUE CROSS/BLUE SHIELD | Source: Ambulatory Visit | Attending: Radiation Oncology | Admitting: Radiation Oncology

## 2017-05-18 DIAGNOSIS — Z1231 Encounter for screening mammogram for malignant neoplasm of breast: Secondary | ICD-10-CM

## 2017-05-18 DIAGNOSIS — Z510 Encounter for antineoplastic radiation therapy: Secondary | ICD-10-CM | POA: Diagnosis not present

## 2017-05-20 ENCOUNTER — Other Ambulatory Visit: Payer: Self-pay | Admitting: Oncology

## 2017-05-20 DIAGNOSIS — C20 Malignant neoplasm of rectum: Secondary | ICD-10-CM

## 2017-05-21 ENCOUNTER — Ambulatory Visit
Admission: RE | Admit: 2017-05-21 | Discharge: 2017-05-21 | Disposition: A | Discharge status: Home / Self Care | Payer: BLUE CROSS/BLUE SHIELD | Source: Ambulatory Visit | Attending: Radiation Oncology | Admitting: Radiation Oncology

## 2017-05-21 DIAGNOSIS — Z510 Encounter for antineoplastic radiation therapy: Secondary | ICD-10-CM | POA: Diagnosis not present

## 2017-05-22 ENCOUNTER — Other Ambulatory Visit: Payer: BLUE CROSS/BLUE SHIELD

## 2017-05-22 ENCOUNTER — Ambulatory Visit
Admission: RE | Admit: 2017-05-22 | Discharge: 2017-05-22 | Disposition: A | Discharge status: Home / Self Care | Payer: BLUE CROSS/BLUE SHIELD | Source: Ambulatory Visit | Attending: Radiation Oncology | Admitting: Radiation Oncology

## 2017-05-22 ENCOUNTER — Ambulatory Visit: Payer: BLUE CROSS/BLUE SHIELD | Admitting: Oncology

## 2017-05-22 DIAGNOSIS — Z510 Encounter for antineoplastic radiation therapy: Secondary | ICD-10-CM | POA: Diagnosis not present

## 2017-05-23 ENCOUNTER — Ambulatory Visit
Admission: RE | Admit: 2017-05-23 | Discharge: 2017-05-23 | Disposition: A | Discharge status: Home / Self Care | Payer: BLUE CROSS/BLUE SHIELD | Source: Ambulatory Visit | Attending: Radiation Oncology | Admitting: Radiation Oncology

## 2017-05-23 DIAGNOSIS — Z510 Encounter for antineoplastic radiation therapy: Secondary | ICD-10-CM | POA: Diagnosis not present

## 2017-05-23 NOTE — Telephone Encounter (Signed)
Left message for pt to call back. Does she have enough Xeloda to complete radiation?

## 2017-05-24 ENCOUNTER — Encounter: Payer: Self-pay | Admitting: Radiation Oncology

## 2017-05-24 ENCOUNTER — Ambulatory Visit
Admission: RE | Admit: 2017-05-24 | Discharge: 2017-05-24 | Disposition: A | Discharge status: Home / Self Care | Payer: BLUE CROSS/BLUE SHIELD | Source: Ambulatory Visit | Attending: Radiation Oncology | Admitting: Radiation Oncology

## 2017-05-24 ENCOUNTER — Other Ambulatory Visit: Payer: Self-pay | Admitting: Radiation Oncology

## 2017-05-24 DIAGNOSIS — Z510 Encounter for antineoplastic radiation therapy: Secondary | ICD-10-CM | POA: Diagnosis not present

## 2017-05-24 MED ORDER — FLUCONAZOLE 150 MG PO TABS: 150.0000 mg | ORAL_TABLET | Freq: Every day | ORAL | 1 refills | Status: DC

## 2017-05-24 NOTE — Progress Notes (Signed)
I saw the patient this morning for her under treatment visit with Dr. Lisbeth Renshaw. She reports she continues to have loose stools secondary to radiation but is managing well with OTC medications such as imodium. She reports that in the last few days she's started noticing vaginal discharge that has a foul odor, and that sometimes itches. She denies any recent antibiotic use, recent sexual activity, or changes in soaps or laundry detergent.   Vitals per nursing notes On physical exam she is a well appearing African American female in no acute distress. She's alert and oriented x 4 and appropriate throughout the exam. Cardiopulmonary assessment is negative for acute distress and she exhibits normal effort. Pelvic exam reveals normal appearing external female genitalia. No lesions are seen grossly. She has desitin cream over the anal verge. Upon inspection of the introitus there is not discharge, and speculum exam reveals mild white clumpy discharge consistent with candida. The remainder of the exam is deferred.  We discussed the findings of vaginal candida. The patient would like to avoid topical monistat cream due to discomfort in the pelvis from her radiotherapy. She is prescribed a course of Fluconazole 150 mg one tablet po x1, and refill x1 is provided. She is counseled on the side effect profile and given precautions. She is also advised to try topical monistat cream externally for itching. We will follow this expectantly.      Carola Rhine, PAC

## 2017-05-25 ENCOUNTER — Ambulatory Visit
Admission: RE | Admit: 2017-05-25 | Discharge: 2017-05-25 | Disposition: A | Discharge status: Home / Self Care | Payer: BLUE CROSS/BLUE SHIELD | Source: Ambulatory Visit | Attending: Radiation Oncology | Admitting: Radiation Oncology

## 2017-05-25 DIAGNOSIS — Z510 Encounter for antineoplastic radiation therapy: Secondary | ICD-10-CM | POA: Diagnosis not present

## 2017-05-28 ENCOUNTER — Ambulatory Visit
Admission: RE | Admit: 2017-05-28 | Discharge: 2017-05-28 | Disposition: A | Discharge status: Home / Self Care | Payer: BLUE CROSS/BLUE SHIELD | Source: Ambulatory Visit | Attending: Oncology | Admitting: Oncology

## 2017-05-28 ENCOUNTER — Other Ambulatory Visit: Payer: Self-pay | Admitting: Oncology

## 2017-05-28 ENCOUNTER — Ambulatory Visit
Admission: RE | Admit: 2017-05-28 | Discharge: 2017-05-28 | Disposition: A | Discharge status: Home / Self Care | Payer: BLUE CROSS/BLUE SHIELD | Source: Ambulatory Visit | Attending: Radiation Oncology | Admitting: Radiation Oncology

## 2017-05-28 ENCOUNTER — Ambulatory Visit: Payer: Self-pay | Admitting: Surgery

## 2017-05-28 DIAGNOSIS — Z1231 Encounter for screening mammogram for malignant neoplasm of breast: Secondary | ICD-10-CM

## 2017-05-28 DIAGNOSIS — Z510 Encounter for antineoplastic radiation therapy: Secondary | ICD-10-CM | POA: Diagnosis not present

## 2017-05-28 DIAGNOSIS — C20 Malignant neoplasm of rectum: Secondary | ICD-10-CM | POA: Diagnosis not present

## 2017-05-28 NOTE — H&P (Signed)
History of Present Illness Donna Brandt M. Devinn Hurwitz MD; 05/28/2017 12:39 PM) Patient words: Donna Brandt is a 41 year old pleasant female referred to me for evaluation of rectal cancer.  She presented to her PCP back in July with bright red blood per rectum and pelvic discomfort.  She underwent colonoscopy March 06, 2017 at [facility] which demonstrated a mass in the mid to distal rectum that was by their documentation estimated to be 6-7 cm above the anal verge - the initial biopsies did not return cancer.  She subsequently underwent staging CT of the chest/abdomen/pelvis as well as a pelvic MRI.  CEA 56. Her staging workup did not demonstrate any evidence of metastatic disease. She then transferred her care to [facility] where her support system is located and is no longer residing in [place]. She underwent additional flex/sig by Dr. 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 He labeled the lateral edges with submucosal SPOT. She subsequently was seen by Dr. Benay Spice and our radiation oncology colleagues.  She is currently undergoing chemoXRT with planned completion 05/30/17. She has tolerated the therapy reasonably well - main issues include dyschezia/fecal urgency and dysuria.No f/c. No bleeding per rectum.  Denies history of fecal incontinence - reports good control currently and prior. She has seen genetic counselors and tested for mutations, negative.  Her pelvic MRI demonstrated an anteriorly located rectal mass, below peritoneal reflection, 4-5cm in lenght, 3-4cm above the puborectalis, without involvement of the internal/external anal sphincter. 1-41mm of normal fat signal between levator and tumor. Abnormal mesorectal fat signal immediately adjacent to tumor. No comment is made if the CRM are actually threatened.  No suspicious lymph nodes. +Left ovarian cyst. cT3bN0M0  PMH: HTN, well controlled on oral antihypertensive PSH: Laparoscopic assistated partial hysterectomy for fibroid 79yrs ago; BTL via umbilicus  FHx: Denies family history of malignancy  Social: Denies use of tobacco/EtOH/drugs.  The patient is a 41 year old female.   Past Surgical History Alean Rinne, [place]; 05/28/2017 11:08 AM) Breast Augmentation   Bilateral. Breast Biopsy   Bilateral. Colon Polyp Removal - Colonoscopy   Hysterectomy (not due to cancer) - Partial   Tonsillectomy    Diagnostic Studies History Alean Rinne, RMA; 05/28/2017 11:08 AM) Colonoscopy   within last year Mammogram   1-3 years ago Pap Smear   1-5 years ago  Allergies Alean Rinne, RMA; 05/28/2017 11:09 AM) Dilaudid *ANALGESICS - OPIOID*   Latex Exam Gloves *MEDICAL DEVICES AND SUPPLIES*   Allergies Reconciled    Medication History Alean Rinne, RMA; 05/28/2017 11:13 AM) Norvasc  (10MG  Tablet, Oral) Active. Xeloda  (500MG  Tablet, Oral) Active. Diflucan  (150MG  Tablet, Oral) Active. Zofran  (8MG  Tablet, Oral) Active. Oxybutynin Chloride  (5MG  Tablet, Oral) Active. Pyridium  (100MG  Tablet, Oral) Active. Probiotic Advanced  (Oral) Active. Compazine  (10MG  Capsule ER, Oral) Active. Zoloft  (50MG  Tablet, Oral) Active. Imitrex  (100MG  Tablet, Oral) Active. Maxzide  (75-50MG  Tablet, Oral) Active. Valtrex  (500MG  Tablet, Oral) Active. Ibuprofen  (600MG  Tablet, Oral) Active. Medications Reconciled   Social History Alean Rinne, [place]; 05/28/2017 11:08 AM) Alcohol use   Occasional alcohol use. Caffeine use   Carbonated beverages.  Family History Alean Rinne, [place]; 05/28/2017 11:08 AM) Breast Cancer   Family Members In General. Cerebrovascular Accident   Father, Mother. Diabetes Mellitus   Family Members In  General, Mother. Hypertension   Mother.  Pregnancy / Birth History Alean Rinne, [place]; 05/28/2017 11:08 AM) Age at menarche   70  years. Gravida   3 Length (months) of breastfeeding   3-6 Maternal age   70-25 Para   3  Other Problems Alean Rinne, [place]; 05/28/2017 11:08 AM) Hemorrhoids   High blood pressure   Rectal Cancer      Review of Systems Alean Rinne RMA; 05/28/2017 11:08 AM) General Present- Appetite Loss and Fatigue. Not Present- Chills, Fever, Night Sweats, Weight Gain and Weight Loss. Skin Present- Dryness and Rash. Not Present- Change in Wart/Mole, Hives, Jaundice, New Lesions, Non-Healing Wounds and Ulcer. HEENT Present- Wears glasses/contact lenses. Not Present- Earache, Hearing Loss, Hoarseness, Nose Bleed, Oral Ulcers, Ringing in the Ears, Seasonal Allergies, Sinus Pain, Sore Throat, Visual Disturbances and Yellow Eyes. Respiratory Present- Snoring. Not Present- Bloody sputum, Chronic Cough, Difficulty Breathing and Wheezing. Breast Not Present- Breast Mass, Breast Pain, Nipple Discharge and Skin Changes. Cardiovascular Not Present- Chest Pain, Difficulty Breathing Lying Down, Leg Cramps, Palpitations, Rapid Heart Rate, Shortness of Breath and Swelling of Extremities. Gastrointestinal Present- Abdominal Pain, Bloating, Bloody Stool, Chronic diarrhea, Constipation, Excessive gas, Hemorrhoids, Nausea and Rectal Pain. Not Present- Change in Bowel Habits, Difficulty Swallowing, Gets full quickly at meals, Indigestion and Vomiting. Female Genitourinary Present- Frequency, Nocturia, Painful Urination, Pelvic Pain and Urgency. Musculoskeletal Not Present- Back Pain, Joint Pain, Joint Stiffness, Muscle Pain, Muscle Weakness and Swelling of Extremities. Neurological Present- Headaches and Weakness. Not Present- Decreased Memory, Fainting, Numbness, Seizures, Tingling, Tremor and Trouble walking. Psychiatric Present- Change in Sleep Pattern and Depression. Not Present- Anxiety, Bipolar, Fearful and Frequent crying. Endocrine Present- Hair Changes and Hot flashes. Not Present- Cold Intolerance, Excessive  Hunger, Heat Intolerance and New Diabetes.  Vitals Mardene Celeste King RMA; 05/28/2017 11:09 AM) 05/28/2017 11:08 AM Weight: 196.2 lb   Height: 67 in  Body Surface Area: 2.01 m   Body Mass Index: 30.73 kg/m   Temp.: 98.3 F    Pulse: 80 (Regular)    BP: 130/72 (Sitting, Left Arm, Standard)       Physical Exam Donna Brandt M. Tylor Gambrill MD; 05/28/2017 12:35 PM) The physical exam findings are as follows: Note: Constitutional: No acute distress, conversant, no deformities Eyes: Moist conjunctiva, no lid lag, anicteric, pupils equal round reactive to light Neck: Trachea midline; no thyromegaly Lungs: Normal respiratory effort; no tactile fremitus CV: Regular rate and rhythm, no palpable thrills, no pitting edema GI: abdomen soft, nontender, nondistended, no palpable hepatosplenomegaly Rectal: external perianal skin tags; no palpable mass; poorly tolerated; anoscopy/procto was therefore not attempted MSK: Normal gait; no clubbing/cyanosis Psych: Appropriate affect; alert and oriented 3 Lymphatics: No palpable cervical or axillary lymphadenopathy    Assessment & Plan Donna Brandt M. Tiwanna Tuch MD; 05/28/2017 12:56 PM) RECTAL CANCER (C20) Impression: Ms. Hudock is a pleasant 7F with mid vs low rectal cancer - cT3N0M0 -Complete chemoXRT - 05/30/17 -Schedule for EUA + Flex sig week of 07/16/17 to further characterize exact location and assess response to therapy; this will be around the 7 week mark with plans for surgery the following week ideally -MRI Pelvis, rectal cancer protocol - around time of flex sig to assess treatment response and levator involvement -She understands her diagnosis well. We discussed the relevant anatomy and physiology of the GI tract and function of the rectum. We discussed the pathophysiology of rectal cancer. We discussed the possible procedures including laparoscopic vs open approaches. We discussed the possibility of performing a LAR with loop ileostomy  vs an APR with  permenant end colostomy. A lot of this will be better determined once I can visualize the tattoo and lesion myself. We also discussed the watchful waiting trials for rectal cancer with complete responses following chemoXRT. She is interested in purusing surgery. We discussed the principles of TME. We discussed the material risks of surgery (including but not limited to pain, bleeding, need for transfusion, infection, scarring, possibility of permenant stoma, injury to ureter, leak, need for additional procedures, recurrence of her cancer, high stoma output, dehydration, heart attack, stroke, death), benefits and alternatives. We discussed the potential for need for chemotherapy following surgery. Her questions were answered and she has elected to proceed -We will also present her case at our MDT tumor board and review MRI with radiology

## 2017-05-29 ENCOUNTER — Ambulatory Visit
Admission: RE | Admit: 2017-05-29 | Discharge: 2017-05-29 | Disposition: A | Discharge status: Home / Self Care | Payer: BLUE CROSS/BLUE SHIELD | Source: Ambulatory Visit | Attending: Radiation Oncology | Admitting: Radiation Oncology

## 2017-05-29 DIAGNOSIS — Z510 Encounter for antineoplastic radiation therapy: Secondary | ICD-10-CM | POA: Diagnosis not present

## 2017-05-30 ENCOUNTER — Ambulatory Visit (HOSPITAL_BASED_OUTPATIENT_CLINIC_OR_DEPARTMENT_OTHER): Payer: BLUE CROSS/BLUE SHIELD | Admitting: Nurse Practitioner

## 2017-05-30 ENCOUNTER — Encounter: Payer: Self-pay | Admitting: Radiation Oncology

## 2017-05-30 ENCOUNTER — Ambulatory Visit
Admission: RE | Admit: 2017-05-30 | Discharge: 2017-05-30 | Disposition: A | Discharge status: Home / Self Care | Payer: BLUE CROSS/BLUE SHIELD | Source: Ambulatory Visit | Attending: Radiation Oncology | Admitting: Radiation Oncology

## 2017-05-30 ENCOUNTER — Ambulatory Visit: Payer: BLUE CROSS/BLUE SHIELD

## 2017-05-30 ENCOUNTER — Other Ambulatory Visit: Payer: Self-pay | Admitting: Radiation Oncology

## 2017-05-30 ENCOUNTER — Ambulatory Visit: Payer: Self-pay | Admitting: Surgery

## 2017-05-30 ENCOUNTER — Telehealth: Payer: Self-pay | Admitting: Oncology

## 2017-05-30 VITALS — BP 126/84 | HR 76 | Temp 98.2°F | Resp 18 | Ht 67.0 in | Wt 194.3 lb

## 2017-05-30 DIAGNOSIS — R300 Dysuria: Secondary | ICD-10-CM

## 2017-05-30 DIAGNOSIS — C20 Malignant neoplasm of rectum: Secondary | ICD-10-CM | POA: Diagnosis not present

## 2017-05-30 DIAGNOSIS — Z510 Encounter for antineoplastic radiation therapy: Secondary | ICD-10-CM | POA: Diagnosis not present

## 2017-05-30 MED ORDER — DOXYCYCLINE HYCLATE 100 MG PO TABS: 100.0000 mg | ORAL_TABLET | Freq: Two times a day (BID) | ORAL | 0 refills | Status: DC

## 2017-05-30 NOTE — Telephone Encounter (Signed)
Gave avs and calendar for November  °

## 2017-05-30 NOTE — Progress Notes (Signed)
The patient was seen today for work in visit due to progressive vaginal discharge. On exam there are no lesions externally. There is copious yellow discharge without debris. No malodorous changes are noted no bleeding on exam with speculum. The cervix is normal in appearance. No blood is noted. Bimanual exam does not reveal any mucosal defects or palpable mass.   We discussed possible endometritis though the patient has not been sexually active and will give a course of doxycycline 100mg  BID x 10 days. She will call for reexam Monday if she's not noticed improvement.

## 2017-05-30 NOTE — Progress Notes (Signed)
  [place] OFFICE PROGRESS NOTE   Diagnosis:  Rectal cancer  INTERVAL HISTORY:   Ms. Wynder returns as scheduled. She completed the final radiation treatment today. She will complete Xeloda this evening. She continues to have intermittent nausea. No mouth sores. Bowels alternate constipation and diarrhea. No hand or foot pain or redness. She does note hyperpigmentation over the palms. She continues to have dysuria. Objective:  Vital signs in last 24 hours:  Blood pressure 126/84, pulse 76, temperature 98.2 F (36.8 C), temperature source Oral, resp. rate 18, height 5\' 7"  (1.702 m), weight 194 lb 4.8 oz (88.1 kg), SpO2 100 %.    HEENT: no thrush or ulcers. Resp: lungs clear bilaterally. Cardio: regular rate and rhythm. GI: abdomen soft and nontender. No hepatomegaly. Vascular: no leg edema. Calves soft and nontender. Skin: Palms with hyperpigmentation. No erythema or skin breakdown.    Lab Results:  Lab Results  Component Value Date   WBC 2.4 (L) 05/16/2017   HGB 12.0 05/16/2017   HCT 35.9 05/16/2017   MCV 80.1 05/16/2017   PLT 128 (L) 05/16/2017   NEUTROABS 1.8 05/16/2017    Imaging:  No results found.  Medications: I have reviewed the patient's current medications.  Assessment/Plan: 1. Rectal cancer, clinical stage T3b,N0,M0  colonoscopy 03/06/2017-biopsy of a rectal mass revealed fragments of an adenomatous lesion with high-grade dysplasia, definite submucosa not available to evaluate for invasion  Proctoscopy/biopsy 03/23/2017 confirmed a mass at 6-7 centimeters from the anal verge, biopsy revealed polypoid colorectal mucosa with detached fragments of adenomatous mucosa  Staging CTs 03/16/2017, anterior rectal mass, no evidence of metastatic disease, no adenopathy,  MRI 03/21/2017- T3b,N0rectal tumor  Initiation of radiation and concurrent Xeloda 04/23/2017. Completed 05/30/2017.  2. Hypertension  3. Depression   Disposition:  Ms. Qazi appears stable. She completed the course of radiation today. She understands to discontinue Xeloda after today's second dose. She will return for labs and a follow-up visit in approximately 4 weeks. She will contact the office in the interim with any problems.  Plan reviewed with Dr. Benay Spice.    Ned Card ANP/GNP-BC   05/30/2017  11:03 AM

## 2017-05-31 ENCOUNTER — Ambulatory Visit: Payer: BLUE CROSS/BLUE SHIELD

## 2017-06-01 ENCOUNTER — Telehealth: Payer: Self-pay | Admitting: Emergency Medicine

## 2017-06-01 NOTE — Telephone Encounter (Signed)
Shona Donna Brandt wanted me to call Ms. Dulude to see how she was doing on the antibiotic that she was given for yeast. Ms. Rabadi stated that she had not started the medication yet because she had not  had time to go to the pharmacy to pick it up ,but that she was going today to get it.

## 2017-06-04 ENCOUNTER — Telehealth: Payer: Self-pay | Admitting: Radiation Oncology

## 2017-06-04 NOTE — Telephone Encounter (Signed)
I called to check on the patient since starting antibiotics but had to leave a message and asked her to call back if she was still having trouble with vaginal discharge.

## 2017-06-07 NOTE — Progress Notes (Signed)
  Radiation Oncology         [phone]) [phone] ________________________________  Name: Donna Brandt MRN: [id number]  Date: 05/30/2017  DOB: 1975/12/31  End of Treatment Note  Diagnosis:   41 y.o. female with Stage IIA, cT3bN0 putative adenocarcinoma of the rectum     Indication for treatment::  curative       Radiation treatment dates:   04/23/2017 - 05/30/2017  Site/dose:   The rectum was treated to 45 Gy in 25 fractions, followed by a 5.4 Gy boost in 3 fractions to yield a total dose of 50.4 Gy, using the mode "Photon" and technique "3D" delivered daily.  Narrative: The patient tolerated radiation treatment relatively well.   She developed a vaginal yeast infection during treatment and was prescribed fluconazole. Side effects of radiation included skin irritation and nausea. She applied creams to her skin and took nausea medication which helped her. She denied any diarrhea.  Plan: The patient has completed radiation treatment. The patient will return to radiation oncology clinic for routine followup in one month. I advised the patient to call or return sooner if they have any questions or concerns related to their recovery or treatment. ________________________________  Jodelle Gross, MD, PhD  This document serves as a record of services personally performed by Kyung Rudd, MD. It was created on his behalf by Rae Lips, a trained medical scribe. The creation of this record is based on the scribe's personal observations and the provider's statements to them. This document has been checked and approved by the attending provider.

## 2017-06-21 ENCOUNTER — Encounter: Payer: Self-pay | Admitting: *Deleted

## 2017-06-21 NOTE — Progress Notes (Signed)
69 Spoke with Donna Brandt she reported having blood with clots in her formed bowel movements small amounts and abdominal aching since Monday, June 18, 2017 until the present. She was back to her normal bowel movement pattern of daily to every other day until this episode.  Denies having fever,nausea, vomiting or feeling dizzy.  Reports she is able to eat. Explained that I would speak with Donna Simpson, PA-C and call her back to let her know if she needs to be seen or what the plan of care will take place. [place] with Donna Simpson, PA-C explained the above situation Anusol suppository with hydrocortisone one suppository bid to rectum x 12 days no refill. [place] the prescription in to [facility] and Donna Brandt was notified about the plan of care.

## 2017-06-22 ENCOUNTER — Ambulatory Visit: Payer: BLUE CROSS/BLUE SHIELD | Admitting: Oncology

## 2017-06-22 ENCOUNTER — Other Ambulatory Visit: Payer: Self-pay | Admitting: Surgery

## 2017-06-22 ENCOUNTER — Other Ambulatory Visit: Payer: BLUE CROSS/BLUE SHIELD

## 2017-06-22 DIAGNOSIS — C20 Malignant neoplasm of rectum: Secondary | ICD-10-CM

## 2017-07-04 ENCOUNTER — Encounter: Payer: Self-pay | Admitting: *Deleted

## 2017-07-04 ENCOUNTER — Other Ambulatory Visit (HOSPITAL_BASED_OUTPATIENT_CLINIC_OR_DEPARTMENT_OTHER): Payer: BLUE CROSS/BLUE SHIELD

## 2017-07-04 ENCOUNTER — Ambulatory Visit
Admission: RE | Admit: 2017-07-04 | Discharge: 2017-07-04 | Disposition: A | Discharge status: Home / Self Care | Payer: BLUE CROSS/BLUE SHIELD | Source: Ambulatory Visit | Attending: Radiation Oncology | Admitting: Radiation Oncology

## 2017-07-04 ENCOUNTER — Other Ambulatory Visit: Payer: Self-pay

## 2017-07-04 ENCOUNTER — Telehealth: Payer: Self-pay | Admitting: Oncology

## 2017-07-04 ENCOUNTER — Encounter: Payer: Self-pay | Admitting: Radiation Oncology

## 2017-07-04 ENCOUNTER — Ambulatory Visit (HOSPITAL_BASED_OUTPATIENT_CLINIC_OR_DEPARTMENT_OTHER): Payer: BLUE CROSS/BLUE SHIELD | Admitting: Oncology

## 2017-07-04 VITALS — BP 125/87 | HR 63 | Temp 98.2°F | Resp 20 | Wt 192.4 lb

## 2017-07-04 VITALS — BP 128/91 | HR 68 | Temp 98.2°F | Resp 18 | Ht 67.0 in | Wt 192.9 lb

## 2017-07-04 DIAGNOSIS — Z923 Personal history of irradiation: Secondary | ICD-10-CM | POA: Diagnosis not present

## 2017-07-04 DIAGNOSIS — K5900 Constipation, unspecified: Secondary | ICD-10-CM | POA: Diagnosis not present

## 2017-07-04 DIAGNOSIS — Z510 Encounter for antineoplastic radiation therapy: Secondary | ICD-10-CM | POA: Diagnosis not present

## 2017-07-04 DIAGNOSIS — C20 Malignant neoplasm of rectum: Secondary | ICD-10-CM

## 2017-07-04 DIAGNOSIS — L819 Disorder of pigmentation, unspecified: Secondary | ICD-10-CM | POA: Diagnosis not present

## 2017-07-04 LAB — CBC WITH DIFFERENTIAL/PLATELET
BASO%: 0.3 % (ref 0.0–2.0)
BASOS ABS: 0 10*3/uL (ref 0.0–0.1)
EOS ABS: 0 10*3/uL (ref 0.0–0.5)
EOS%: 1.3 % (ref 0.0–7.0)
HCT: 35.4 % (ref 34.8–46.6)
HEMOGLOBIN: 11.8 g/dL (ref 11.6–15.9)
LYMPH%: 14.6 % (ref 14.0–49.7)
MCH: 27.3 pg (ref 25.1–34.0)
MCHC: 33.3 g/dL (ref 31.5–36.0)
MCV: 81.8 fL (ref 79.5–101.0)
MONO#: 0.3 10*3/uL (ref 0.1–0.9)
MONO%: 9.4 % (ref 0.0–14.0)
NEUT%: 74.4 % (ref 38.4–76.8)
NEUTROS ABS: 2.3 10*3/uL (ref 1.5–6.5)
PLATELETS: 180 10*3/uL (ref 145–400)
RBC: 4.33 10*6/uL (ref 3.70–5.45)
RDW: 14.7 % — ABNORMAL HIGH (ref 11.2–14.5)
WBC: 3.1 10*3/uL — AB (ref 3.9–10.3)
lymph#: 0.5 10*3/uL — ABNORMAL LOW (ref 0.9–3.3)

## 2017-07-04 LAB — COMPREHENSIVE METABOLIC PANEL
ALT: 25 U/L (ref 0–55)
AST: 18 U/L (ref 5–34)
Albumin: 4 g/dL (ref 3.5–5.0)
Alkaline Phosphatase: 90 U/L (ref 40–150)
Anion Gap: 7 mEq/L (ref 3–11)
BUN: 15 mg/dL (ref 7.0–26.0)
CALCIUM: 10.4 mg/dL (ref 8.4–10.4)
CHLORIDE: 110 meq/L — AB (ref 98–109)
CO2: 25 meq/L (ref 22–29)
Creatinine: 0.8 mg/dL (ref 0.6–1.1)
EGFR: >60 mL/min/{1.73_m2} (ref >60)
Glucose: 106 mg/dl (ref 70–140)
POTASSIUM: 4 meq/L (ref 3.5–5.1)
SODIUM: 143 meq/L (ref 136–145)
Total Bilirubin: 0.4 mg/dL (ref 0.20–1.20)
Total Protein: 7.6 g/dL (ref 6.4–8.3)

## 2017-07-04 NOTE — Addendum Note (Signed)
Encounter addended by: Hayden Pedro, PA-C on: 07/04/2017 10:35 AM  Actions taken: Sign clinical note, Visit diagnoses modified, Problem List reviewed, LOS modified, Follow-up modified

## 2017-07-04 NOTE — Progress Notes (Signed)
[facility] Clinical Lawyer met with patient in [place] office at [facility].  Patient expressed financial concerns due to having to be out of work after her upcoming surgery.  CSw and patient discussed financial resources.  CSW provided patient with information on Cancer Care.  Patient plans to initiate application process.  CSW also connected patient with financial advocate to discuss applying for the [place].  CSW encouraged patient to call with any questions or concerns.    Johnnye Lana, MSW, LCSW, OSW-C Clinical Social Worker [facility] [phone]

## 2017-07-04 NOTE — Progress Notes (Signed)
Radiation Oncology         [phone] ________________________________  Name: Donna Brandt MRN: [id number]  Date of Service: 07/04/2017 DOB: 12-08-1975  Post Treatment Note  CC: Patient, No Pcp Per  Ladell Pier, MD  Diagnosis:   Stage IIA, cT3bN0 putative adenocarcinoma of the rectum     Interval Since Last Radiation:  5 weeks    04/23/2017 - 05/30/2017: The rectum was treated to 45 Gy in 25 fractions, followed by a 5.4 Gy boost in 3 fractions to yield a total dose of 50.4 Gy, using the mode "Photon" and technique "3D" delivered daily.   Narrative:  The patient returns today for routine follow-up. She tolerated radiotherapy with chemo well and is planning to proceed with post treatment imaging and scope with Dr. Dema Severin prior to proceeding with LAR on 07/26/17. She did have difficulty with rectal pain, dysuria, hematochezia, and vaginal discharge during treatment. She responded well to fluconazole for a vaginal  Yeast infection, then to doxycycline for possible endometritis. She continues pyridum prn, and has not noticed improvement with Anusol suppositories, however does feel that constipation is playing a role in her rectal bleeding. She has not taken any cathartics or stool softeners.                              ALLERGIES:  is allergic to dilaudid [hydromorphone hcl] and latex.  Meds: Current Outpatient Medications  Medication Sig Dispense Refill  . amLODipine (NORVASC) 10 MG tablet Take 10 mg by mouth daily.    . capecitabine (XELODA) 500 MG tablet Take 4 tabs (2000mg ) by mouth in AM and 3 tab (1500mg ) in PM, within 30 min of finishing meals. Take on radiation days only. 196 tablet 0  . hydrocortisone (ANUSOL-HC) 25 MG suppository Place 25 mg 2 (two) times daily rectally.    Marland Kitchen ibuprofen (ADVIL,MOTRIN) 600 MG tablet Take 1 tablet (600 mg total) by mouth 3 (three) times daily. Take with food 30 tablet 0  . oxybutynin (DITROPAN XL) 5 MG 24 hr tablet Take 1 tablet (5 mg total)  by mouth at bedtime. 30 tablet 0  . phenazopyridine (PYRIDIUM) 100 MG tablet Take 1 tablet (100 mg total) by mouth 3 (three) times daily as needed for pain. 45 tablet 1  . Probiotic Product (PROBIOTIC-10) CAPS Take 1 capsule by mouth daily.     . sertraline (ZOLOFT) 50 MG tablet Take 50 mg by mouth daily.    . SUMAtriptan (IMITREX) 100 MG tablet Take 100 mg by mouth every 2 (two) hours as needed for migraine. May repeat once in 2 hours if needed  99  . valACYclovir (VALTREX) 500 MG tablet Take 500 mg by mouth daily as needed.    . doxycycline (VIBRA-TABS) 100 MG tablet Take 1 tablet (100 mg total) by mouth 2 (two) times daily. 20 tablet 0  . fluconazole (DIFLUCAN) 150 MG tablet Take 1 tablet (150 mg total) by mouth daily. 1 tablet 1  . ondansetron (ZOFRAN) 8 MG tablet Take 1 tablet (8 mg total) by mouth every 8 (eight) hours as needed for nausea or vomiting. (Patient not taking: Reported on 07/04/2017) 30 tablet 3  . prochlorperazine (COMPAZINE) 10 MG tablet Take 1 tablet (10 mg total) by mouth every 6 (six) hours as needed for nausea or vomiting. (Patient not taking: Reported on 07/04/2017) 30 tablet 0  . triamterene-hydrochlorothiazide (MAXZIDE) 75-50 MG tablet Take 1 tablet by mouth every morning.  99   No current facility-administered medications for this encounter.     Physical Findings:  weight is 192 lb 6 oz (87.3 kg). Her oral temperature is 98.2 F (36.8 C). Her blood pressure is 125/87 and her pulse is 63. Her respiration is 20 and oxygen saturation is 100%.  Pain Assessment Pain Score: 0-No pain/10 In general this is a well appearing African American female in no acute distress. She's alert and oriented x4 and appropriate throughout the examination. Cardiopulmonary assessment is negative for acute distress and she exhibits normal effort. Rectal exam is deferred.  Lab Findings: Lab Results  Component Value Date   WBC 3.1 (L) 07/04/2017   HGB 11.8 07/04/2017   HCT 35.4 07/04/2017     MCV 81.8 07/04/2017   PLT 180 07/04/2017     Radiographic Findings: No results found.  Impression/Plan: 1. Stage IIA, cT3bN0 putative adenocarcinoma of the rectum. The patient is still recovering from the effects of radiotherapy. She is scheduled for sigmoidoscopy and MRI with Dr. Dema Severin, and with plans for minimally invasive LAR versus open +/- ileostomy. We will follow along to see her progress, and would be happy to see her back as needed moving forward. She will also follow up with Dr. Benay Spice following surgery and will discuss final pathology to determine if additional adjuvant therapy is necessary.     Carola Rhine, PAC

## 2017-07-04 NOTE — Telephone Encounter (Signed)
Gave avs and calendar for January 2019 °

## 2017-07-04 NOTE — Progress Notes (Signed)
  [place] OFFICE PROGRESS NOTE   Diagnosis: Rectal cancer  INTERVAL HISTORY:   Ms. Galyon completed Xeloda and radiation 05/30/2017.  She reports improvement in abdominal pain and dysuria.  She continues to have constipation and rectal bleeding.  She reports skin thickening, hyperpigmentation, and dryness of the hands and feet.  She has discomfort at the soles.  She is undergoing a preoperative evaluation by Dr. Dema Severin.  Objective:  Vital signs in last 24 hours:  Blood pressure (!) 128/91, pulse 68, temperature 98.2 F (36.8 C), temperature source Oral, resp. rate 18, height 5\' 7"  (1.702 m), weight 192 lb 14.4 oz (87.5 kg), SpO2 100 %.    HEENT: No thrush or ulcers Resp: Lungs clear bilaterally Cardio: Regular rate and rhythm GI: No hepatomegaly, nontender Vascular: No leg edema Skin: Skin thickening at the palms and soles with hyperpigmentation.  No skin breakdown or erythema.   Lab Results:  Lab Results  Component Value Date   WBC 3.1 (L) 07/04/2017   HGB 11.8 07/04/2017   HCT 35.4 07/04/2017   MCV 81.8 07/04/2017   PLT 180 07/04/2017   NEUTROABS 2.3 07/04/2017    CMP     Component Value Date/Time   NA 143 07/04/2017 0813   K 4.0 07/04/2017 0813   CO2 25 07/04/2017 0813   GLUCOSE 106 07/04/2017 0813   BUN 15.0 07/04/2017 0813   CREATININE 0.8 07/04/2017 0813   CALCIUM 10.4 07/04/2017 0813   PROT 7.6 07/04/2017 0813   ALBUMIN 4.0 07/04/2017 0813   AST 18 07/04/2017 0813   ALT 25 07/04/2017 0813   ALKPHOS 90 07/04/2017 0813   BILITOT 0.40 07/04/2017 0813    Lab Results  Component Value Date   CEA1 55.78 (H) 04/19/2017    Medications: I have reviewed the patient's current medications.  Assessment/Plan: 1. Rectal cancer, clinical stage T3b,N0,M0  colonoscopy 03/06/2017-biopsy of a rectal mass revealed fragments of an adenomatous lesion with high-grade dysplasia, definite submucosa not available to evaluate for  invasion  Proctoscopy/biopsy 03/23/2017 confirmed a mass at 6-7 centimeters from the anal verge, biopsy revealed polypoid colorectal mucosa with detached fragments of adenomatous mucosa  Staging CTs 03/16/2017, anterior rectal mass, no evidence of metastatic disease, no adenopathy,  MRI 03/21/2017-T3b,N0rectal tumor  Initiationof radiation and concurrent Xeloda 04/23/2017. Completed 05/30/2017.  2. Hypertension  3. Depression   Disposition:  [name] completed neoadjuvant chemotherapy/radiation.  She is undergoing a preoperative evaluation by Dr. Dema Severin with the plan to have surgery next month.  I will see her after surgery to review the pathology report and recommend adjuvant systemic therapy as indicated.  The skin thickening and hyperpigmentation should improve over the next several weeks.  I encouraged her to use a stool softener for the constipation.  15 minutes were spent with the patient today.  The majority of the time was used for counseling and coordination of care.  Betsy Coder, MD  07/04/2017  9:21 AM

## 2017-07-09 ENCOUNTER — Encounter (HOSPITAL_COMMUNITY): Payer: Self-pay | Admitting: Emergency Medicine

## 2017-07-09 ENCOUNTER — Ambulatory Visit
Admission: RE | Admit: 2017-07-09 | Discharge: 2017-07-09 | Disposition: A | Discharge status: Home / Self Care | Payer: BLUE CROSS/BLUE SHIELD | Source: Ambulatory Visit | Attending: Surgery | Admitting: Surgery

## 2017-07-09 ENCOUNTER — Other Ambulatory Visit: Payer: Self-pay

## 2017-07-09 DIAGNOSIS — C20 Malignant neoplasm of rectum: Secondary | ICD-10-CM

## 2017-07-09 MED ORDER — GADOBENATE DIMEGLUMINE 529 MG/ML IV SOLN
18.0000 mL | Freq: Once | INTRAVENOUS | Status: AC | PRN
  Administered 2017-07-09: 18 mL via INTRAVENOUS

## 2017-07-11 ENCOUNTER — Encounter: Payer: Self-pay | Admitting: *Deleted

## 2017-07-11 NOTE — Progress Notes (Signed)
[place] Work  Holiday representative met with patient in [place] office at [facility] to review and complete Cancer Care application.  CSW and patient completed application, and application was submitted to Cancer Care.    Johnnye Lana, MSW, LCSW, OSW-C Clinical Social Worker [facility] [phone]

## 2017-07-18 ENCOUNTER — Encounter (HOSPITAL_COMMUNITY): Payer: Self-pay | Admitting: *Deleted

## 2017-07-18 ENCOUNTER — Ambulatory Visit (HOSPITAL_COMMUNITY)
Admission: RE | Admit: 2017-07-18 | Discharge: 2017-07-18 | Disposition: A | Discharge status: Home / Self Care | Payer: BLUE CROSS/BLUE SHIELD | Source: Ambulatory Visit | Attending: Surgery | Admitting: Surgery

## 2017-07-18 ENCOUNTER — Other Ambulatory Visit: Payer: Self-pay

## 2017-07-18 ENCOUNTER — Ambulatory Visit (HOSPITAL_COMMUNITY): Payer: BLUE CROSS/BLUE SHIELD | Admitting: Anesthesiology

## 2017-07-18 ENCOUNTER — Encounter (HOSPITAL_COMMUNITY)
Admission: RE | Disposition: A | Discharge status: Home / Self Care | Payer: Self-pay | Source: Ambulatory Visit | Attending: Surgery

## 2017-07-18 DIAGNOSIS — I10 Essential (primary) hypertension: Secondary | ICD-10-CM | POA: Insufficient documentation

## 2017-07-18 DIAGNOSIS — Z79899 Other long term (current) drug therapy: Secondary | ICD-10-CM | POA: Diagnosis not present

## 2017-07-18 DIAGNOSIS — Z86010 Personal history of colonic polyps: Secondary | ICD-10-CM | POA: Insufficient documentation

## 2017-07-18 DIAGNOSIS — C20 Malignant neoplasm of rectum: Secondary | ICD-10-CM | POA: Diagnosis present

## 2017-07-18 DIAGNOSIS — K644 Residual hemorrhoidal skin tags: Secondary | ICD-10-CM | POA: Diagnosis not present

## 2017-07-18 HISTORY — PX: FLEXIBLE SIGMOIDOSCOPY: SHX5431

## 2017-07-18 SURGERY — SIGMOIDOSCOPY, FLEXIBLE
Anesthesia: Monitor Anesthesia Care

## 2017-07-18 MED ORDER — PROPOFOL 10 MG/ML IV BOLUS
INTRAVENOUS | Status: AC
  Filled 2017-07-18: qty 60

## 2017-07-18 MED ORDER — PROPOFOL 500 MG/50ML IV EMUL
INTRAVENOUS | Status: DC | PRN
  Administered 2017-07-18: 140 ug/kg/min via INTRAVENOUS

## 2017-07-18 MED ORDER — ONDANSETRON HCL 4 MG/2ML IJ SOLN
INTRAMUSCULAR | Status: DC | PRN
  Administered 2017-07-18: 4 mg via INTRAVENOUS

## 2017-07-18 MED ORDER — PROPOFOL 10 MG/ML IV BOLUS
INTRAVENOUS | Status: DC | PRN
  Administered 2017-07-18 (×4): 20 mg via INTRAVENOUS

## 2017-07-18 MED ORDER — LACTATED RINGERS IV SOLN
INTRAVENOUS | Status: AC | PRN
  Administered 2017-07-18: 1000 mL via INTRAVENOUS

## 2017-07-18 MED ORDER — LIDOCAINE 2% (20 MG/ML) 5 ML SYRINGE
INTRAMUSCULAR | Status: DC | PRN
  Administered 2017-07-18: 100 mg via INTRAVENOUS

## 2017-07-18 MED ORDER — SODIUM CHLORIDE 0.9 % IV SOLN: INTRAVENOUS | Status: DC

## 2017-07-18 NOTE — Anesthesia Preprocedure Evaluation (Signed)
Anesthesia Evaluation  Patient identified by MRN, date of birth, ID band Patient awake    Reviewed: Allergy & Precautions, NPO status , Patient's Chart, lab work & pertinent test results  History of Anesthesia Complications Negative for: history of anesthetic complications  Airway Mallampati: II  TM Distance: >3 FB Neck ROM: Full    Dental  (+) Teeth Intact   Pulmonary neg pulmonary ROS,    breath sounds clear to auscultation       Cardiovascular hypertension, Pt. on medications (-) angina(-) Past MI and (-) CHF  Rhythm:Regular     Neuro/Psych  Headaches, PSYCHIATRIC DISORDERS Depression    GI/Hepatic Neg liver ROS, Rectal ca   Endo/Other  negative endocrine ROS  Renal/GU negative Renal ROS     Musculoskeletal negative musculoskeletal ROS (+)   Abdominal   Peds  Hematology negative hematology ROS (+)   Anesthesia Other Findings   Reproductive/Obstetrics                             Anesthesia Physical Anesthesia Plan  ASA: II  Anesthesia Plan: MAC   Post-op Pain Management:    Induction: Intravenous  PONV Risk Score and Plan: 2 and Treatment may vary due to age or medical condition  Airway Management Planned: Nasal Cannula  Additional Equipment: None  Intra-op Plan:   Post-operative Plan:   Informed Consent: I have reviewed the patients History and Physical, chart, labs and discussed the procedure including the risks, benefits and alternatives for the proposed anesthesia with the patient or authorized representative who has indicated his/her understanding and acceptance.   Dental advisory given  Plan Discussed with: CRNA and Surgeon  Anesthesia Plan Comments:         Anesthesia Quick Evaluation

## 2017-07-18 NOTE — Discharge Instructions (Signed)
Monitored Anesthesia Care, Care After These instructions provide you with information about caring for yourself after your procedure. Your health care provider may also give you more specific instructions. Your treatment has been planned according to current medical practices, but problems sometimes occur. Call your health care provider if you have any problems or questions after your procedure. What can I expect after the procedure? After your procedure, it is common to:  Feel sleepy for several hours.  Feel clumsy and have poor balance for several hours.  Feel forgetful about what happened after the procedure.  Have poor judgment for several hours.  Feel nauseous or vomit.  Have a sore throat if you had a breathing tube during the procedure.  Follow these instructions at home: For at least 24 hours after the procedure:   Do not: ? Participate in activities in which you could fall or become injured. ? Drive. ? Use heavy machinery. ? Drink alcohol. ? Take sleeping pills or medicines that cause drowsiness. ? Make important decisions or sign legal documents. ? Take care of children on your own.  Rest. Eating and drinking  Follow the diet that is recommended by your health care provider.  If you vomit, drink water, juice, or soup when you can drink without vomiting.  Make sure you have little or no nausea before eating solid foods. General instructions  Have a responsible adult stay with you until you are awake and alert.  Take over-the-counter and prescription medicines only as told by your health care provider.  If you smoke, do not smoke without supervision.  Keep all follow-up visits as told by your health care provider. This is important. Contact a health care provider if:  You keep feeling nauseous or you keep vomiting.  You feel light-headed.  You develop a rash.  You have a fever. Get help right away if:  You have trouble breathing. This information is  not intended to replace advice given to you by your health care provider. Make sure you discuss any questions you have with your health care provider. Document Released: 11/14/2015 Document Revised: 03/15/2016 Document Reviewed: 11/14/2015 Elsevier Interactive Patient Education  2018 [employer].  Flexible Sigmoidoscopy, Care After This sheet gives you information about how to care for yourself after your procedure. Your health care provider may also give you more specific instructions. If you have problems or questions, contact your health care provider. What can I expect after the procedure? After the procedure, it is common to have:  Abdominal cramping or pain.  Bloating.  A small amount of rectal bleeding if you had a biopsy.  Follow these instructions at home:  Take over-the-counter and prescription medicines only as told by your health care provider.  Do not drive for 24 hours if you received a medicine to help you relax (sedative).  Keep all follow-up visits as told by your health care provider. This is important. Contact a health care provider if:  You have abdominal pain or cramping that gets worse or is not helped with medicine.  You continue to have small amounts of rectal bleeding after 24 hours.  You have nausea or vomiting.  You feel weak or dizzy.  You have a fever. Get help right away if:  You pass large blood clots or see a large amount of blood in the toilet after having a bowel movement.  You have nausea or vomiting for more than 24 hours after the procedure. This information is not intended to replace advice given  to you by your health care provider. Make sure you discuss any questions you have with your health care provider. Document Released: 07/29/2013 Document Revised: 02/11/2016 Document Reviewed: 10/23/2015 Elsevier Interactive Patient Education  [employer].

## 2017-07-18 NOTE — Op Note (Signed)
[facility] Patient Name: Donna Brandt Procedure Date: 07/18/2017 MRN: [id number] Attending MD: Ileana Roup MD, MD Date of Birth: 01/23/1976 CSN: [id number] Age: 41 Admit Type: Outpatient Procedure:                Flexible Sigmoidoscopy Indications:              Preoperative assessment, Rectal cancer Providers:                Sharon Mt. Jessyca Sloan MD, MD, Cleda Daub, RN,                            Tinnie Gens, Technician, Danley Danker, CRNA Referring MD:              Medicines:                Monitored Anesthesia Care Complications:            No immediate complications. Estimated Blood Loss:     Estimated blood loss: none. Procedure:                Pre-Anesthesia Assessment:                           - ASA Grade Assessment: I - A normal, healthy                            patient.                           - After reviewing the risks and benefits, the                            patient was deemed in satisfactory condition to                            undergo the procedure.                           - The anesthesia plan was to use monitored                            anesthesia care (MAC).                           After obtaining informed consent, the scope was                            passed under direct vision. The [id number])                            scope was introduced through the anus and advanced                            to the the sigmoid colon. The flexible                            sigmoidoscopy was accomplished without difficulty.  The patient tolerated the procedure well. The                            quality of the bowel preparation was adequate. Scope In: 7:84:69 AM Scope Out: 8:54:39 AM Total Procedure Duration: 0 hours 7 minutes 55 seconds  Findings:      The perianal exam findings include hemorrhoids and skin tags.      The digital rectal exam revealed a 2 cm (diameter) firm rectal mass/scar        without significant intraluminal protrusion palpated 2 to 4 cm from the       anal verge. The mass was non-circumferential and located predominantly       at the anterior wall at the level of the 3rd valve of [place].      A sessile non-obstructing small mass/scar was found in the distal       rectum. The mass measured 2 cm in length. No bleeding was present.       Estimated blood loss: none.      The exam was otherwise without abnormality. Impression:               - Hemorrhoids and perianal skin tags found on                            perianal exam.                           - Rectal mass/scar 2 cm from the anal verge.                           - Tumor/scar in the distal rectum with tattoo                            surrounding it.                           - The examination was otherwise normal.                           - No specimens collected. Moderate Sedation:      N/A- Per Anesthesia Care Recommendation:           - Use original regular Metamucil one teaspoon PO                            daily. Procedure Code(s):        --- Professional ---                           [phone], Sigmoidoscopy, flexible; diagnostic,                            including collection of specimen(s) by brushing or                            washing, when performed (separate procedure) Diagnosis Code(s):        --- Professional ---  C20, Malignant neoplasm of rectum CPT copyright 2016 American Medical Association. All rights reserved. The codes documented in this report are preliminary and upon coder review may  be revised to meet current compliance requirements. Nadeen Landau, MD Ileana Roup MD, MD 07/18/2017 9:06:33 AM This report has been signed electronically. Number of Addenda: 0

## 2017-07-18 NOTE — Anesthesia Procedure Notes (Signed)
Date/Time: 07/18/2017 8:40 AM Performed by: Sharlette Dense, CRNA Oxygen Delivery Method: Simple face mask

## 2017-07-18 NOTE — H&P (Signed)
CC: Here for EUA/flex sig; hx rectal cancer  History of Present Illness Patient words: Donna Brandt is a 41 year old pleasant female referred to me for evaluation of rectal cancer.  She presented to her PCP back in July with bright red blood per rectum and pelvic discomfort.  She underwent colonoscopy March 06, 2017 at [facility] which demonstrated a mass in the mid to distal rectum that was by their documentation estimated to be 6-7 cm above the anal verge - the initial biopsies did not return cancer.  She subsequently underwent staging CT of the chest/abdomen/pelvis as well as a pelvic MRI.  CEA 56. Her staging workup did not demonstrate any evidence of metastatic disease. She then transferred her care to [facility] where her support system is located and is no longer residing in [place]. She underwent additional flex/sig by Dr. 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 He labeled the lateral edges with submucosal SPOT. She subsequently was seen by Dr. Benay Spice and our radiation oncology colleagues.  She underwent chemoXRT, completed 05/30/17. She has tolerated the therapy reasonably well - main issues include dyschezia/fecal urgency and dysuria. No f/c. No bleeding per rectum.  Denies history of fecal incontinence - reports good control currently and prior. She has seen genetic counselors and tested for mutations, negative.  Her pelvic MRI demonstrated an anteriorly located rectal mass, below peritoneal reflection, 4-5cm in lenght, 3-4cm above the puborectalis, without involvement of the internal/external anal sphincter. 1-84mm of normal fat signal between levator and tumor. Abnormal mesorectal fat signal immediately adjacent to tumor. No comment is made if the CRM are actually threatened. No suspicious lymph nodes.  +Left ovarian cyst. cT3bN0M0  PMH: HTN, well controlled on oral antihypertensive  PSH: Laparoscopic assistated partial hysterectomy for fibroid 15yrs ago; BTL via umbilicus  FHx: Denies family history of malignancy  Social: Denies use of tobacco/EtOH/drugs.   Past Surgical History Breast Augmentation   Bilateral. Breast Biopsy   Bilateral. Colon Polyp Removal - Colonoscopy   Hysterectomy (not due to cancer) - Partial   Tonsillectomy    Diagnostic Studies History Colonoscopy   within last year Mammogram   1-3 years ago Pap Smear   1-5 years ago  Allergies Dilaudid *ANALGESICS - OPIOID*   Latex Exam Gloves *MEDICAL DEVICES AND SUPPLIES*   Allergies Reconciled    Medication History Norvasc  (10MG  Tablet, Oral) Active. Xeloda  (500MG  Tablet, Oral) Active. Diflucan  (150MG  Tablet, Oral) Active. Zofran  (8MG  Tablet, Oral) Active. Oxybutynin Chloride  (5MG  Tablet, Oral) Active. Pyridium  (100MG  Tablet, Oral) Active. Probiotic Advanced  (Oral) Active. Compazine  (10MG  Capsule ER, Oral) Active. Zoloft  (50MG  Tablet, Oral) Active. Imitrex  (100MG  Tablet, Oral) Active. Maxzide  (75-50MG  Tablet, Oral) Active. Valtrex  (500MG  Tablet, Oral) Active. Ibuprofen  (600MG  Tablet, Oral) Active. Medications Reconciled   Social History Alcohol use   Occasional alcohol use. Caffeine use   Carbonated beverages.  Family History Breast Cancer   Family Members In General. Cerebrovascular Accident   Father, Mother. Diabetes Mellitus   Family Members In General, Mother. Hypertension   Mother.  Pregnancy / Birth History Age at menarche   55 years. Gravida   3 Length (months) of breastfeeding   3-6 Maternal age   59-25 Para   3  Other Problems  Hemorrhoids  High blood pressure   Rectal Cancer      Review of Systems General Present- Appetite Loss and Fatigue. Not Present- Chills, Fever, Night Sweats, Weight Gain and Weight Loss. Skin Present- Dryness and Rash. Not Present-  Change in Wart/Mole, Hives, Jaundice, New Lesions, Non-Healing Wounds and Ulcer. HEENT Present- Wears glasses/contact lenses. Not Present- Earache, Hearing Loss, Hoarseness, Nose Bleed, Oral Ulcers, Ringing in the Ears, Seasonal Allergies, Sinus Pain, Sore Throat, Visual Disturbances and Yellow Eyes. Respiratory Present- Snoring. Not Present- Bloody sputum, Chronic Cough, Difficulty Breathing and Wheezing. Breast Not Present- Breast Mass, Breast Pain, Nipple Discharge and Skin Changes. Cardiovascular Not Present- Chest Pain, Difficulty Breathing Lying Down, Leg Cramps, Palpitations, Rapid Heart Rate, Shortness of Breath and Swelling of Extremities. Gastrointestinal Present- Abdominal Pain, Bloating, Bloody Stool, Chronic diarrhea, Constipation, Excessive gas, Hemorrhoids, Nausea and Rectal Pain. Not Present- Change in Bowel Habits, Difficulty Swallowing, Gets full quickly at meals, Indigestion and Vomiting. Female Genitourinary Present- Frequency, Nocturia, Painful Urination, Pelvic Pain and Urgency. Musculoskeletal Not Present- Back Pain, Joint Pain, Joint Stiffness, Muscle Pain, Muscle Weakness and Swelling of Extremities. Neurological Present- Headaches and Weakness. Not Present- Decreased Memory, Fainting, Numbness, Seizures, Tingling, Tremor and Trouble walking. Psychiatric Present- Change in Sleep Pattern and Depression. Not Present- Anxiety, Bipolar, Fearful and Frequent crying. Endocrine Present- Hair Changes and Hot flashes. Not Present- Cold Intolerance, Excessive Hunger, Heat Intolerance and New Diabetes.  Vitals:   07/18/17 0748  BP: 126/84  Resp: 18  Temp: 98.1 F (36.7 C)  TempSrc: Oral  SpO2: 99%  Weight: 87.1 kg (192 lb)  Height: 5\' 7"  (1.702 m)    Physical Exam The physical exam findings are as follows: Note: Constitutional: No acute distress, conversant, no deformities Eyes: Moist conjunctiva, no lid lag, anicteric, pupils equal round reactive to light Neck: Trachea  midline; no thyromegaly Lungs: Normal respiratory effort; no tactile fremitus CV: Regular rate and rhythm, no palpable thrills, no pitting edema GI: abdomen soft, nontender, nondistended, no palpable hepatosplenomegaly Rectal: external perianal skin tags; no palpable mass; poorly tolerated; anoscopy/procto was therefore not attempted in office; eval pending today MSK: Normal gait; no clubbing/cyanosis Psych: Appropriate affect; alert and oriented 3 Lymphatics: No palpable cervical or axillary lymphadenopathy  Assessment & Plan RECTAL CANCER (C20) Impression: Donna Brandt is a pleasant 53F with mid vs low rectal cancer - cT3N0M0 -Completed chemoXRT - 05/30/17 -Plan EUA + Flex sig week today -She understands her diagnosis well. We discussed the relevant anatomy and physiology of the GI tract and function of the rectum. We discussed the pathophysiology of rectal cancer. We discussed the possible procedures including laparoscopic vs open approaches. We discussed the possibility of performing a LAR with loop ileostomy vs an APR with permenant end colostomy. A lot of this will be better determined once I can visualize the tattoo and lesion myself. We also discussed the watchful waiting trials for rectal cancer with complete responses following chemoXRT. She is interested in purusing surgery. We discussed the principles of TME. We discussed the material risks of surgery (including but not limited to pain, bleeding, need for transfusion, infection, scarring, possibility of permenant stoma, injury to ureter, leak, need for additional procedures, recurrence of her cancer, high stoma output, dehydration, heart attack, stroke, death), benefits and alternatives. We discussed the potential for need for chemotherapy following surgery. Her questions were answered and she has elected to proceed  Sharon Mt. Dema Severin, M.D. General and Colorectal Surgery [facility]

## 2017-07-18 NOTE — Transfer of Care (Signed)
Immediate Anesthesia Transfer of Care Note  Patient: Donna Brandt  Procedure(s) Performed: FLEXIBLE SIGMOIDOSCOPY EXAM UNDER ANESTHESIA (N/A )  Patient Location: Endoscopy Unit  Anesthesia Type:MAC  Level of Consciousness: awake  Airway & Oxygen Therapy: Patient Spontanous Breathing and Patient connected to face mask oxygen  Post-op Assessment: Report given to RN and Post -op Vital signs reviewed and stable  Post vital signs: Reviewed and stable  Last Vitals:  Vitals:   07/18/17 0748  BP: 126/84  Resp: 18  Temp: 36.7 C  SpO2: 99%    Last Pain:  Vitals:   07/18/17 0748  TempSrc: Oral         Complications: No apparent anesthesia complications

## 2017-07-18 NOTE — Anesthesia Postprocedure Evaluation (Signed)
Anesthesia Post Note  Patient: Donna Brandt  Procedure(s) Performed: FLEXIBLE SIGMOIDOSCOPY EXAM UNDER ANESTHESIA (N/A )     Patient location during evaluation: PACU Anesthesia Type: MAC Level of consciousness: awake and alert Pain management: pain level controlled Vital Signs Assessment: post-procedure vital signs reviewed and stable Respiratory status: spontaneous breathing, nonlabored ventilation, respiratory function stable and patient connected to nasal cannula oxygen Cardiovascular status: stable and blood pressure returned to baseline Postop Assessment: no apparent nausea or vomiting Anesthetic complications: no    Last Vitals:  Vitals:   07/18/17 0920 07/18/17 0925  BP: 120/86   Pulse: 64   Resp: 17   Temp:    SpO2: 100% 100%    Last Pain:  Vitals:   07/18/17 0903  TempSrc: Oral  PainSc: [place]

## 2017-07-19 ENCOUNTER — Encounter (HOSPITAL_COMMUNITY): Payer: Self-pay | Admitting: Surgery

## 2017-07-20 NOTE — Patient Instructions (Addendum)
Azaryah Heathcock  07/20/2017   Your procedure is scheduled on: Thursday, Dec. 20, 2018   Report to [facility] Main  Entrance   Take [place]  elevators to 3rd floor to  Welcome at 5:30 AM.    Call this number if you have problems the morning of surgery [phone]    Remember: ONLY 1 PERSON MAY GO WITH YOU TO SHORT STAY TO GET  READY MORNING OF [place].   Do not eat food or drink liquids :After Midnight.   Take these medicines the morning of surgery with A SIP OF WATER: Amlodipine (blood pressure), Sertraline (depression)                               You may not have any metal on your body including hair pins, jewelry, and body  piercings             Do not wear make-up, lotions, powders, perfumes, or deodorant             Do not wear nail polish.  Do not shave  48 hours prior to surgery.                Do not bring valuables to the hospital. [place].   Contacts, dentures or bridgework may not be worn into surgery.   Leave suitcase in the car. After surgery it may be brought to your room.    Special Instructions:  Miralax: Mix with 64 oz Gatorade/Powerade. Drink gradually over the next few hours (8 oz glass every 15-30 minutes) until gone the day prior to surgery.               Please read over the following fact sheets you were given: _____________________________________________________________________ [facility] - Preparing for Surgery Before surgery, you can play an important role.  Because skin is not sterile, your skin needs to be as free of germs as possible.  You can reduce the number of germs on your skin by washing with CHG (chlorahexidine gluconate) soap before surgery.  CHG is an antiseptic cleaner which kills germs and bonds with the skin to continue killing germs even after washing. Please DO NOT use if you have an allergy to CHG or antibacterial soaps.  If your skin becomes  reddened/irritated stop using the CHG and inform your nurse when you arrive at Short Stay. Do not shave (including legs and underarms) for at least 48 hours prior to the first CHG shower.  You may shave your face/neck.  Please follow these instructions carefully:  1.  Shower with CHG Soap the night before surgery and the  morning of surgery.  2.  If you choose to wash your hair, wash your hair first as usual with your normal  shampoo.  3.  After you shampoo, rinse your hair and body thoroughly to remove the shampoo.                             4.  Use CHG as you would any other liquid soap.  You can apply chg directly to the skin and wash.  Gently with a scrungie or clean washcloth.  5.  Apply the  CHG Soap to your body ONLY FROM THE NECK DOWN.   Do   not use on face/ open                           Wound or open sores. Avoid contact with eyes, ears mouth and   genitals (private parts).                       Wash face,  Genitals (private parts) with your normal soap.             6.  Wash thoroughly, paying special attention to the area where your    surgery  will be performed.  7.  Thoroughly rinse your body with warm water from the neck down.  8.  DO NOT shower/wash with your normal soap after using and rinsing off the CHG Soap.                9.  Pat yourself dry with a clean towel.            10.  Wear clean pajamas.            11.  Place clean sheets on your bed the night of your first shower and do not  sleep with pets. Day of Surgery : Do not apply any lotions/deodorants the morning of surgery.  Please wear clean clothes to the hospital/surgery center.  FAILURE TO FOLLOW THESE INSTRUCTIONS MAY RESULT IN THE CANCELLATION OF YOUR SURGERY  PATIENT SIGNATURE_________________________________  NURSE SIGNATURE__________________________________  ________________________________________________________________________   Adam Phenix  An incentive spirometer is a tool that can help  keep your lungs clear and active. This tool measures how well you are filling your lungs with each breath. Taking long deep breaths may help reverse or decrease the chance of developing breathing (pulmonary) problems (especially infection) following:  A long period of time when you are unable to move or be active. BEFORE THE PROCEDURE   If the spirometer includes an indicator to show your best effort, your nurse or respiratory therapist will set it to a desired goal.  If possible, sit up straight or lean slightly forward. Try not to slouch.  Hold the incentive spirometer in an upright position. INSTRUCTIONS FOR USE  1. Sit on the edge of your bed if possible, or sit up as far as you can in bed or on a chair. 2. Hold the incentive spirometer in an upright position. 3. Breathe out normally. 4. Place the mouthpiece in your mouth and seal your lips tightly around it. 5. Breathe in slowly and as deeply as possible, raising the piston or the ball toward the top of the column. 6. Hold your breath for 3-5 seconds or for as long as possible. Allow the piston or ball to fall to the bottom of the column. 7. Remove the mouthpiece from your mouth and breathe out normally. 8. Rest for a few seconds and repeat Steps 1 through 7 at least 10 times every 1-2 hours when you are awake. Take your time and take a few normal breaths between deep breaths. 9. The spirometer may include an indicator to show your best effort. Use the indicator as a goal to work toward during each repetition. 10. After each set of 10 deep breaths, practice coughing to be sure your lungs are clear. If you have an incision (the cut made at the time of surgery), support your incision when coughing  by placing a pillow or rolled up towels firmly against it. Once you are able to get out of bed, walk around indoors and cough well. You may stop using the incentive spirometer when instructed by your caregiver.  RISKS AND COMPLICATIONS  Take your  time so you do not get dizzy or light-headed.  If you are in pain, you may need to take or ask for pain medication before doing incentive spirometry. It is harder to take a deep breath if you are having pain. AFTER USE  Rest and breathe slowly and easily.  It can be helpful to keep track of a log of your progress. Your caregiver can provide you with a simple table to help with this. If you are using the spirometer at home, follow these instructions: [place] IF:   You are having difficultly using the spirometer.  You have trouble using the spirometer as often as instructed.  Your pain medication is not giving enough relief while using the spirometer.  You develop fever of 100.5 F (38.1 C) or higher. SEEK IMMEDIATE MEDICAL CARE IF:   You cough up bloody sputum that had not been present before.  You develop fever of 102 F (38.9 C) or greater.  You develop worsening pain at or near the incision site. MAKE SURE YOU:   Understand these instructions.  Will watch your condition.  Will get help right away if you are not doing well or get worse. Document Released: 12/04/2006 Document Revised: 10/16/2011 Document Reviewed: 02/04/2007 [facility] Patient Information 2014 [place], [place].   ________________________________________________________________________

## 2017-07-20 NOTE — Pre-Procedure Instructions (Signed)
Last office visit note from Dr. Benay Spice in epic 07/04/17.

## 2017-07-23 ENCOUNTER — Encounter (HOSPITAL_COMMUNITY)
Admission: RE | Admit: 2017-07-23 | Discharge: 2017-07-23 | Disposition: A | Discharge status: Home / Self Care | Payer: BLUE CROSS/BLUE SHIELD | Source: Ambulatory Visit | Attending: Surgery | Admitting: Surgery

## 2017-07-23 ENCOUNTER — Other Ambulatory Visit: Payer: Self-pay

## 2017-07-23 ENCOUNTER — Encounter (HOSPITAL_COMMUNITY): Payer: Self-pay

## 2017-07-23 DIAGNOSIS — Z01812 Encounter for preprocedural laboratory examination: Secondary | ICD-10-CM

## 2017-07-23 DIAGNOSIS — Z01810 Encounter for preprocedural cardiovascular examination: Secondary | ICD-10-CM

## 2017-07-23 HISTORY — DX: Other complications of anesthesia, initial encounter: T88.59XA

## 2017-07-23 HISTORY — DX: Adverse effect of unspecified anesthetic, initial encounter: T41.45XA

## 2017-07-23 LAB — COMPREHENSIVE METABOLIC PANEL
ALBUMIN: 3.9 g/dL (ref 3.5–5.0)
ALK PHOS: 89 U/L (ref 38–126)
ALT: 31 U/L (ref 14–54)
AST: 25 U/L (ref 15–41)
Anion gap: 4 — ABNORMAL LOW (ref 5–15)
BILIRUBIN TOTAL: 0.9 mg/dL (ref 0.3–1.2)
BUN: 13 mg/dL (ref 6–20)
CALCIUM: 10 mg/dL (ref 8.9–10.3)
CO2: 27 mmol/L (ref 22–32)
CREATININE: 0.61 mg/dL (ref 0.44–1.00)
Chloride: 108 mmol/L (ref 101–111)
GFR calc Af Amer: >60 mL/min (ref >60)
GLUCOSE: 111 mg/dL — AB (ref 65–99)
POTASSIUM: 3.8 mmol/L (ref 3.5–5.1)
Sodium: 139 mmol/L (ref 135–145)
TOTAL PROTEIN: 7.3 g/dL (ref 6.5–8.1)

## 2017-07-23 LAB — CBC WITH DIFFERENTIAL/PLATELET
BASOS PCT: 0 %
Basophils Absolute: 0 10*3/uL (ref 0.0–0.1)
EOS ABS: 0 10*3/uL (ref 0.0–0.7)
Eosinophils Relative: 1 %
HEMATOCRIT: 33.6 % — AB (ref 36.0–46.0)
Hemoglobin: 11.2 g/dL — ABNORMAL LOW (ref 12.0–15.0)
Lymphocytes Relative: 18 %
Lymphs Abs: 0.5 10*3/uL — ABNORMAL LOW (ref 0.7–4.0)
MCH: 26.9 pg (ref 26.0–34.0)
MCHC: 33.3 g/dL (ref 30.0–36.0)
MCV: 80.8 fL (ref 78.0–100.0)
MONO ABS: 0.2 10*3/uL (ref 0.1–1.0)
MONOS PCT: 7 %
Neutro Abs: 2.2 10*3/uL (ref 1.7–7.7)
Neutrophils Relative %: 74 %
Platelets: 176 10*3/uL (ref 150–400)
RBC: 4.16 MIL/uL (ref 3.87–5.11)
RDW: 14.3 % (ref 11.5–15.5)
WBC: 3 10*3/uL — ABNORMAL LOW (ref 4.0–10.5)

## 2017-07-23 LAB — ABO/RH: ABO/RH(D): O POS

## 2017-07-23 MED ORDER — POLYETHYLENE GLYCOL 3350 17 GM/SCOOP PO POWD
1.0000 | Freq: Once | ORAL | Status: DC
  Filled 2017-07-23: qty 255

## 2017-07-23 NOTE — Consult Note (Signed)
[facility] Nurse ostomy consult note  [place] Nurse requested for preoperative stoma site marking by Dr. Dema Severin.  Discussed surgical procedure and stoma creation with patient..  Explained role of the [facility] nurse team.  Answered patient questions.   Examined patient sitting and standing in order to place the marking in the patient's visual field, away from any creases or abdominal contour issues and within the rectus muscle.   Marked for colostomy in the LLQ  9cm to the left of the umbilicus and  2cm above the umbilicus.  Marked for ileostomy in the RLQ  7cm to the right of the umbilicus and  3cm above the umbilicus.  Patient's abdomen cleansed with CHG wipes x2 at site markings, allowed to air dry prior to marking.Covered marks with thin film transparent dressings to preserve mark until date of surgery (07/26/17).  Patient is able to visualize both markings.   [place] nursing team will follow, and will remain available to this patient, the nursing, surgical and medical teams should an ostomy be created intraoperatively.   Thanks, Maudie Flakes, MSN, RN, [place], Arther Abbott  Pager# [phone]

## 2017-07-23 NOTE — Pre-Procedure Instructions (Signed)
Donna Brandt meet with Donna Brandt ostomy care nurse during pre surgical appointment.

## 2017-07-23 NOTE — Pre-Procedure Instructions (Signed)
CBC and CMP results 07/23/17 faxed to Dr. Dema Severin via epic.

## 2017-07-24 LAB — HEMOGLOBIN A1C
Hgb A1c MFr Bld: 5.6 % (ref 4.8–5.6)
Mean Plasma Glucose: 114 mg/dL

## 2017-07-25 NOTE — Anesthesia Preprocedure Evaluation (Addendum)
Anesthesia Evaluation  Patient identified by MRN, date of birth, ID band Patient awake    Reviewed: Allergy & Precautions, NPO status , Patient's Chart, lab work & pertinent test results  Airway Mallampati: II  TM Distance: >3 FB     Dental   Pulmonary    breath sounds clear to auscultation       Cardiovascular hypertension,  Rhythm:Regular Rate:Normal     Neuro/Psych    GI/Hepatic Neg liver ROS, History noted. CG   Endo/Other  negative endocrine ROS  Renal/GU negative Renal ROS     Musculoskeletal   Abdominal   Peds  Hematology   Anesthesia Other Findings   Reproductive/Obstetrics                            Anesthesia Physical Anesthesia Plan  ASA: III  Anesthesia Plan: General   Post-op Pain Management:    Induction: Intravenous  PONV Risk Score and Plan: 3 and Ondansetron, Dexamethasone, Midazolam and Treatment may vary due to age or medical condition  Airway Management Planned: Oral ETT  Additional Equipment:   Intra-op Plan:   Post-operative Plan: Possible Post-op intubation/ventilation  Informed Consent: I have reviewed the patients History and Physical, chart, labs and discussed the procedure including the risks, benefits and alternatives for the proposed anesthesia with the patient or authorized representative who has indicated his/her understanding and acceptance.   Dental advisory given  Plan Discussed with: Anesthesiologist and CRNA  Anesthesia Plan Comments:        Anesthesia Quick Evaluation

## 2017-07-26 ENCOUNTER — Encounter (HOSPITAL_COMMUNITY)
Admission: RE | Disposition: A | Discharge status: Home / Self Care | Payer: Self-pay | Source: Home / Self Care | Attending: Surgery

## 2017-07-26 ENCOUNTER — Other Ambulatory Visit: Payer: Self-pay

## 2017-07-26 ENCOUNTER — Inpatient Hospital Stay (HOSPITAL_COMMUNITY): Payer: BLUE CROSS/BLUE SHIELD | Admitting: Anesthesiology

## 2017-07-26 ENCOUNTER — Encounter (HOSPITAL_COMMUNITY): Payer: Self-pay | Admitting: *Deleted

## 2017-07-26 ENCOUNTER — Other Ambulatory Visit (HOSPITAL_COMMUNITY)
Admission: RE | Admit: 2017-07-26 | Discharge: 2017-07-26 | Disposition: A | Discharge status: Home / Self Care | Payer: BLUE CROSS/BLUE SHIELD | Source: Ambulatory Visit | Attending: Oncology | Admitting: Oncology

## 2017-07-26 ENCOUNTER — Inpatient Hospital Stay (HOSPITAL_COMMUNITY)
Admission: RE | Admit: 2017-07-26 | Discharge: 2017-07-30 | DRG: 331 | Disposition: A | Discharge status: Home / Self Care | Payer: BLUE CROSS/BLUE SHIELD | Attending: Surgery | Admitting: Surgery

## 2017-07-26 DIAGNOSIS — Z791 Long term (current) use of non-steroidal anti-inflammatories (NSAID): Secondary | ICD-10-CM | POA: Diagnosis not present

## 2017-07-26 DIAGNOSIS — Z803 Family history of malignant neoplasm of breast: Secondary | ICD-10-CM

## 2017-07-26 DIAGNOSIS — I10 Essential (primary) hypertension: Secondary | ICD-10-CM | POA: Diagnosis present

## 2017-07-26 DIAGNOSIS — Z90710 Acquired absence of both cervix and uterus: Secondary | ICD-10-CM

## 2017-07-26 DIAGNOSIS — C20 Malignant neoplasm of rectum: Principal | ICD-10-CM | POA: Diagnosis present

## 2017-07-26 DIAGNOSIS — Z91040 Latex allergy status: Secondary | ICD-10-CM | POA: Diagnosis not present

## 2017-07-26 DIAGNOSIS — N83209 Unspecified ovarian cyst, unspecified side: Secondary | ICD-10-CM | POA: Diagnosis present

## 2017-07-26 DIAGNOSIS — Z885 Allergy status to narcotic agent status: Secondary | ICD-10-CM | POA: Diagnosis not present

## 2017-07-26 DIAGNOSIS — Z8249 Family history of ischemic heart disease and other diseases of the circulatory system: Secondary | ICD-10-CM

## 2017-07-26 DIAGNOSIS — Z79899 Other long term (current) drug therapy: Secondary | ICD-10-CM

## 2017-07-26 DIAGNOSIS — Z86010 Personal history of colonic polyps: Secondary | ICD-10-CM

## 2017-07-26 HISTORY — PX: PROCTOSCOPY: SHX2266

## 2017-07-26 HISTORY — PX: LAPAROSCOPIC LOW ANTERIOR RESECTION: SHX5904

## 2017-07-26 HISTORY — PX: FLEXIBLE SIGMOIDOSCOPY: SHX5431

## 2017-07-26 LAB — CBC
HCT: 36.4 % (ref 36.0–46.0)
Hemoglobin: 12.3 g/dL (ref 12.0–15.0)
MCH: 27 pg (ref 26.0–34.0)
MCHC: 33.8 g/dL (ref 30.0–36.0)
MCV: 80 fL (ref 78.0–100.0)
PLATELETS: 191 10*3/uL (ref 150–400)
RBC: 4.55 MIL/uL (ref 3.87–5.11)
RDW: 14 % (ref 11.5–15.5)
WBC: 8.1 10*3/uL (ref 4.0–10.5)

## 2017-07-26 LAB — CREATININE, SERUM: CREATININE: 0.86 mg/dL (ref 0.44–1.00)

## 2017-07-26 LAB — TYPE AND SCREEN
ABO/RH(D): O POS
Antibody Screen: NEGATIVE

## 2017-07-26 SURGERY — RESECTION, RECTUM, LOW ANTERIOR, LAPAROSCOPIC
Anesthesia: General | Site: Rectum

## 2017-07-26 MED ORDER — SODIUM CHLORIDE 0.9% FLUSH: 9.0000 mL | INTRAVENOUS | Status: DC | PRN

## 2017-07-26 MED ORDER — CEFOTETAN DISODIUM-DEXTROSE 2-2.08 GM-%(50ML) IV SOLR
2.0000 g | INTRAVENOUS | Status: AC
  Administered 2017-07-26: 2 g via INTRAVENOUS

## 2017-07-26 MED ORDER — LACTATED RINGERS IV SOLN
INTRAVENOUS | Status: DC | PRN
  Administered 2017-07-26 (×2): via INTRAVENOUS

## 2017-07-26 MED ORDER — BUPIVACAINE-EPINEPHRINE (PF) 0.25% -1:200000 IJ SOLN
INTRAMUSCULAR | Status: AC
  Filled 2017-07-26: qty 30

## 2017-07-26 MED ORDER — FENTANYL CITRATE (PF) 100 MCG/2ML IJ SOLN
INTRAMUSCULAR | Status: AC
  Filled 2017-07-26: qty 4

## 2017-07-26 MED ORDER — ROCURONIUM BROMIDE 50 MG/5ML IV SOSY
PREFILLED_SYRINGE | INTRAVENOUS | Status: AC
  Filled 2017-07-26: qty 5

## 2017-07-26 MED ORDER — SERTRALINE HCL 100 MG PO TABS
100.0000 mg | ORAL_TABLET | Freq: Every day | ORAL | Status: DC
  Administered 2017-07-27 – 2017-07-30 (×4): 100 mg via ORAL
  Filled 2017-07-26 (×4): qty 1

## 2017-07-26 MED ORDER — FENTANYL CITRATE (PF) 100 MCG/2ML IJ SOLN: 25.0000 ug | INTRAMUSCULAR | Status: DC | PRN

## 2017-07-26 MED ORDER — ONDANSETRON HCL 4 MG/2ML IJ SOLN: 4.0000 mg | Freq: Four times a day (QID) | INTRAMUSCULAR | Status: DC | PRN

## 2017-07-26 MED ORDER — KETAMINE HCL 10 MG/ML IJ SOLN
INTRAMUSCULAR | Status: AC
  Filled 2017-07-26: qty 1

## 2017-07-26 MED ORDER — SUMATRIPTAN SUCCINATE 50 MG PO TABS
100.0000 mg | ORAL_TABLET | ORAL | Status: DC | PRN
  Filled 2017-07-26: qty 2

## 2017-07-26 MED ORDER — ONDANSETRON HCL 4 MG/2ML IJ SOLN
INTRAMUSCULAR | Status: DC | PRN
  Administered 2017-07-26 (×2): 4 mg via INTRAVENOUS

## 2017-07-26 MED ORDER — DIPHENHYDRAMINE HCL 50 MG/ML IJ SOLN: 12.5000 mg | Freq: Four times a day (QID) | INTRAMUSCULAR | Status: DC | PRN

## 2017-07-26 MED ORDER — SUGAMMADEX SODIUM 200 MG/2ML IV SOLN
INTRAVENOUS | Status: DC | PRN
  Administered 2017-07-26: 200 mg via INTRAVENOUS

## 2017-07-26 MED ORDER — LIDOCAINE 2% (20 MG/ML) 5 ML SYRINGE
INTRAMUSCULAR | Status: AC
  Filled 2017-07-26: qty 5

## 2017-07-26 MED ORDER — SCOPOLAMINE 1 MG/3DAYS TD PT72
MEDICATED_PATCH | TRANSDERMAL | Status: AC
  Filled 2017-07-26: qty 1

## 2017-07-26 MED ORDER — FENTANYL CITRATE (PF) 250 MCG/5ML IJ SOLN
INTRAMUSCULAR | Status: AC
  Filled 2017-07-26: qty 5

## 2017-07-26 MED ORDER — PROPOFOL 10 MG/ML IV BOLUS
INTRAVENOUS | Status: AC
  Filled 2017-07-26: qty 20

## 2017-07-26 MED ORDER — ONDANSETRON HCL 4 MG/2ML IJ SOLN
INTRAMUSCULAR | Status: AC
  Filled 2017-07-26: qty 2

## 2017-07-26 MED ORDER — CHLORHEXIDINE GLUCONATE CLOTH 2 % EX PADS: 6.0000 | MEDICATED_PAD | Freq: Once | CUTANEOUS | Status: DC

## 2017-07-26 MED ORDER — FENTANYL CITRATE (PF) 250 MCG/5ML IJ SOLN
INTRAMUSCULAR | Status: DC | PRN
  Administered 2017-07-26: 50 ug via INTRAVENOUS
  Administered 2017-07-26: 100 ug via INTRAVENOUS
  Administered 2017-07-26 (×2): 50 ug via INTRAVENOUS

## 2017-07-26 MED ORDER — ALVIMOPAN 12 MG PO CAPS
12.0000 mg | ORAL_CAPSULE | ORAL | Status: AC
  Administered 2017-07-26: 12 mg via ORAL
  Filled 2017-07-26: qty 1

## 2017-07-26 MED ORDER — ACETAMINOPHEN 500 MG PO TABS
1000.0000 mg | ORAL_TABLET | ORAL | Status: AC
  Administered 2017-07-26: 1000 mg via ORAL
  Filled 2017-07-26: qty 2

## 2017-07-26 MED ORDER — AMLODIPINE BESYLATE 10 MG PO TABS
10.0000 mg | ORAL_TABLET | Freq: Every day | ORAL | Status: DC
  Administered 2017-07-27 – 2017-07-30 (×4): 10 mg via ORAL
  Filled 2017-07-26 (×4): qty 1

## 2017-07-26 MED ORDER — ACETAMINOPHEN 325 MG PO TABS
650.0000 mg | ORAL_TABLET | Freq: Four times a day (QID) | ORAL | Status: DC
Start: 2017-07-26 — End: 2017-07-30
  Administered 2017-07-26 – 2017-07-30 (×15): 650 mg via ORAL
  Filled 2017-07-26 (×15): qty 2

## 2017-07-26 MED ORDER — DIPHENHYDRAMINE HCL 50 MG/ML IJ SOLN: 25.0000 mg | Freq: Four times a day (QID) | INTRAMUSCULAR | Status: DC | PRN

## 2017-07-26 MED ORDER — BUPIVACAINE HCL (PF) 0.5 % IJ SOLN: INTRAMUSCULAR | Status: DC | PRN

## 2017-07-26 MED ORDER — LIDOCAINE 2% (20 MG/ML) 5 ML SYRINGE
INTRAMUSCULAR | Status: DC | PRN
  Administered 2017-07-26: 1.5 mg/kg/h via INTRAVENOUS

## 2017-07-26 MED ORDER — BUPIVACAINE LIPOSOME 1.3 % IJ SUSP
20.0000 mL | Freq: Once | INTRAMUSCULAR | Status: AC
  Administered 2017-07-26: 20 mL
  Filled 2017-07-26: qty 20

## 2017-07-26 MED ORDER — SUCCINYLCHOLINE CHLORIDE 20 MG/ML IJ SOLN
INTRAMUSCULAR | Status: DC | PRN
  Administered 2017-07-26: 100 mg via INTRAVENOUS

## 2017-07-26 MED ORDER — MORPHINE SULFATE (PF) 2 MG/ML IV SOLN
2.0000 mg | Freq: Once | INTRAVENOUS | Status: AC
  Administered 2017-07-26: 2 mg via INTRAVENOUS
  Filled 2017-07-26: qty 1

## 2017-07-26 MED ORDER — GABAPENTIN 300 MG PO CAPS
300.0000 mg | ORAL_CAPSULE | ORAL | Status: AC
  Administered 2017-07-26: 300 mg via ORAL
  Filled 2017-07-26: qty 1

## 2017-07-26 MED ORDER — OXYBUTYNIN CHLORIDE ER 5 MG PO TB24: 5.0000 mg | ORAL_TABLET | Freq: Every day | ORAL | Status: DC | PRN

## 2017-07-26 MED ORDER — LACTATED RINGERS IR SOLN
Status: DC | PRN
Start: 2017-07-26 — End: 2017-07-26
  Administered 2017-07-26: 2000 mL

## 2017-07-26 MED ORDER — ROCURONIUM BROMIDE 10 MG/ML (PF) SYRINGE
PREFILLED_SYRINGE | INTRAVENOUS | Status: DC | PRN
  Administered 2017-07-26: 50 mg via INTRAVENOUS
  Administered 2017-07-26 (×4): 10 mg via INTRAVENOUS

## 2017-07-26 MED ORDER — LIDOCAINE 2% (20 MG/ML) 5 ML SYRINGE
INTRAMUSCULAR | Status: DC | PRN
  Administered 2017-07-26: 60 mg via INTRAVENOUS

## 2017-07-26 MED ORDER — ONDANSETRON HCL 4 MG PO TABS
4.0000 mg | ORAL_TABLET | Freq: Four times a day (QID) | ORAL | Status: DC | PRN
  Administered 2017-07-30: 4 mg via ORAL
  Filled 2017-07-26: qty 1

## 2017-07-26 MED ORDER — HYDRALAZINE HCL 20 MG/ML IJ SOLN: 10.0000 mg | INTRAMUSCULAR | Status: DC | PRN

## 2017-07-26 MED ORDER — KETAMINE HCL 10 MG/ML IJ SOLN
INTRAMUSCULAR | Status: DC | PRN
  Administered 2017-07-26: 30 mg via INTRAVENOUS
  Administered 2017-07-26 (×3): 10 mg via INTRAVENOUS

## 2017-07-26 MED ORDER — MIDAZOLAM HCL 2 MG/2ML IJ SOLN
INTRAMUSCULAR | Status: AC
  Filled 2017-07-26: qty 2

## 2017-07-26 MED ORDER — DEXAMETHASONE SODIUM PHOSPHATE 10 MG/ML IJ SOLN
INTRAMUSCULAR | Status: AC
  Filled 2017-07-26: qty 1

## 2017-07-26 MED ORDER — MORPHINE SULFATE 2 MG/ML IV SOLN
INTRAVENOUS | Status: DC
  Administered 2017-07-26: 19:00:00 via INTRAVENOUS
  Administered 2017-07-27: 6 mg via INTRAVENOUS
  Administered 2017-07-27 (×3): 3 mg via INTRAVENOUS
  Administered 2017-07-27 – 2017-07-28 (×2): 1.5 mg via INTRAVENOUS
  Filled 2017-07-26: qty 30

## 2017-07-26 MED ORDER — DIPHENHYDRAMINE HCL 25 MG PO CAPS: 25.0000 mg | ORAL_CAPSULE | Freq: Four times a day (QID) | ORAL | Status: DC | PRN

## 2017-07-26 MED ORDER — BUPIVACAINE-EPINEPHRINE (PF) 0.25% -1:200000 IJ SOLN
INTRAMUSCULAR | Status: DC | PRN
  Administered 2017-07-26: 30 mL via PERINEURAL

## 2017-07-26 MED ORDER — MIDAZOLAM HCL 5 MG/5ML IJ SOLN
INTRAMUSCULAR | Status: DC | PRN
  Administered 2017-07-26: 2 mg via INTRAVENOUS

## 2017-07-26 MED ORDER — CEFOTETAN DISODIUM-DEXTROSE 2-2.08 GM-%(50ML) IV SOLR
INTRAVENOUS | Status: AC
  Filled 2017-07-26: qty 50

## 2017-07-26 MED ORDER — PROPOFOL 10 MG/ML IV BOLUS
INTRAVENOUS | Status: DC | PRN
  Administered 2017-07-26: 200 mg via INTRAVENOUS

## 2017-07-26 MED ORDER — DEXAMETHASONE SODIUM PHOSPHATE 10 MG/ML IJ SOLN
INTRAMUSCULAR | Status: DC | PRN
  Administered 2017-07-26: 10 mg via INTRAVENOUS

## 2017-07-26 MED ORDER — ALVIMOPAN 12 MG PO CAPS
12.0000 mg | ORAL_CAPSULE | Freq: Two times a day (BID) | ORAL | Status: DC
  Administered 2017-07-27 – 2017-07-28 (×3): 12 mg via ORAL
  Filled 2017-07-26 (×3): qty 1

## 2017-07-26 MED ORDER — 0.9 % SODIUM CHLORIDE (POUR BTL) OPTIME
TOPICAL | Status: DC | PRN
  Administered 2017-07-26: 2000 mL

## 2017-07-26 MED ORDER — SUGAMMADEX SODIUM 200 MG/2ML IV SOLN
INTRAVENOUS | Status: AC
  Filled 2017-07-26: qty 2

## 2017-07-26 MED ORDER — NALOXONE HCL 0.4 MG/ML IJ SOLN: 0.4000 mg | INTRAMUSCULAR | Status: DC | PRN

## 2017-07-26 MED ORDER — DIPHENHYDRAMINE HCL 12.5 MG/5ML PO ELIX: 12.5000 mg | ORAL_SOLUTION | Freq: Four times a day (QID) | ORAL | Status: DC | PRN

## 2017-07-26 MED ORDER — ALUM & MAG HYDROXIDE-SIMETH 200-200-20 MG/5ML PO SUSP: 30.0000 mL | Freq: Four times a day (QID) | ORAL | Status: DC | PRN

## 2017-07-26 MED ORDER — PHENYLEPHRINE 40 MCG/ML (10ML) SYRINGE FOR IV PUSH (FOR BLOOD PRESSURE SUPPORT)
PREFILLED_SYRINGE | INTRAVENOUS | Status: DC | PRN
  Administered 2017-07-26 (×2): 80 ug via INTRAVENOUS

## 2017-07-26 MED ORDER — LACTATED RINGERS IV SOLN
INTRAVENOUS | Status: DC
  Administered 2017-07-26 – 2017-07-29 (×3): via INTRAVENOUS

## 2017-07-26 MED ORDER — FENTANYL CITRATE (PF) 100 MCG/2ML IJ SOLN
25.0000 ug | INTRAMUSCULAR | Status: DC | PRN
  Administered 2017-07-26 (×2): 50 ug via INTRAVENOUS
  Administered 2017-07-26: 25 ug via INTRAVENOUS
  Administered 2017-07-26: 50 ug via INTRAVENOUS
  Administered 2017-07-26: 25 ug via INTRAVENOUS

## 2017-07-26 MED ORDER — HEPARIN SODIUM (PORCINE) 5000 UNIT/ML IJ SOLN
5000.0000 [IU] | Freq: Three times a day (TID) | INTRAMUSCULAR | Status: DC
  Administered 2017-07-26 – 2017-07-30 (×12): 5000 [IU] via SUBCUTANEOUS
  Filled 2017-07-26 (×12): qty 1

## 2017-07-26 MED ORDER — OXYCODONE HCL 5 MG PO TABS
10.0000 mg | ORAL_TABLET | Freq: Four times a day (QID) | ORAL | Status: DC | PRN
  Administered 2017-07-26 – 2017-07-30 (×4): 10 mg via ORAL
  Filled 2017-07-26 (×5): qty 2

## 2017-07-26 MED ORDER — KETOROLAC TROMETHAMINE 15 MG/ML IJ SOLN
15.0000 mg | Freq: Four times a day (QID) | INTRAMUSCULAR | Status: AC
  Administered 2017-07-26 – 2017-07-28 (×8): 15 mg via INTRAVENOUS
  Filled 2017-07-26 (×8): qty 1

## 2017-07-26 MED ORDER — METHYLENE BLUE 0.5 % INJ SOLN
INTRAVENOUS | Status: AC
  Filled 2017-07-26: qty 10

## 2017-07-26 SURGICAL SUPPLY — 78 items
APPLIER CLIP 5 13 M/L LIGAMAX5 (MISCELLANEOUS)
APPLIER CLIP ROT 10 11.4 M/L (STAPLE)
BLADE EXTENDED COATED 6.5IN (ELECTRODE) IMPLANT
BLADE HEX COATED 2.75 (ELECTRODE) ×5 IMPLANT
CABLE HIGH FREQUENCY MONO STRZ (ELECTRODE) ×5 IMPLANT
CELLS DAT CNTRL 66122 CELL SVR (MISCELLANEOUS) IMPLANT
CLIP APPLIE 5 13 M/L LIGAMAX5 (MISCELLANEOUS) IMPLANT
CLIP APPLIE ROT 10 11.4 M/L (STAPLE) IMPLANT
COUNTER NEEDLE 20 DBL MAG RED (NEEDLE) IMPLANT
COVER MAYO STAND STRL (DRAPES) IMPLANT
DECANTER SPIKE VIAL GLASS SM (MISCELLANEOUS) ×5 IMPLANT
DERMABOND ADVANCED (GAUZE/BANDAGES/DRESSINGS) ×2
DERMABOND ADVANCED .7 DNX12 (GAUZE/BANDAGES/DRESSINGS) ×3 IMPLANT
DISSECTOR BLUNT TIP ENDO 5MM (MISCELLANEOUS) IMPLANT
DRAIN CHANNEL 19F RND (DRAIN) ×5 IMPLANT
DRAPE LAPAROSCOPIC ABDOMINAL (DRAPES) ×5 IMPLANT
DRAPE SURG IRRIG POUCH 19X23 (DRAPES) ×5 IMPLANT
DRSG OPSITE POSTOP 4X10 (GAUZE/BANDAGES/DRESSINGS) IMPLANT
DRSG OPSITE POSTOP 4X6 (GAUZE/BANDAGES/DRESSINGS) ×5 IMPLANT
DRSG OPSITE POSTOP 4X8 (GAUZE/BANDAGES/DRESSINGS) IMPLANT
ELECT REM PT RETURN 15FT ADLT (MISCELLANEOUS) ×5 IMPLANT
EVACUATOR SILICONE 100CC (DRAIN) ×5 IMPLANT
GAUZE SPONGE 4X4 12PLY STRL (GAUZE/BANDAGES/DRESSINGS) IMPLANT
GLOVE ECLIPSE 8.0 STRL XLNG CF (GLOVE) ×10 IMPLANT
GLOVE INDICATOR 8.0 STRL GRN (GLOVE) ×10 IMPLANT
GOWN STRL REUS W/TWL XL LVL3 (GOWN DISPOSABLE) ×20 IMPLANT
HOLDER FOLEY CATH W/STRAP (MISCELLANEOUS) ×5 IMPLANT
KIT DEFENDO BUTTON (KITS) ×5 IMPLANT
LEGGING LITHOTOMY PAIR STRL (DRAPES) ×5 IMPLANT
LIGASURE IMPACT 36 18CM CVD LR (INSTRUMENTS) IMPLANT
PACK COLON (CUSTOM PROCEDURE TRAY) ×10 IMPLANT
PAD POSITIONING PINK XL (MISCELLANEOUS) ×5 IMPLANT
PORT LAP GEL ALEXIS MED 5-9CM (MISCELLANEOUS) ×5 IMPLANT
POUCH OSTOMY FLEX CONVEX 1-1/2 (OSTOMY) ×5 IMPLANT
RELOAD STAPLER GREEN 60MM (STAPLE) ×6 IMPLANT
RTRCTR WOUND ALEXIS 18CM MED (MISCELLANEOUS)
SCISSORS LAP 5X35 DISP (ENDOMECHANICALS) ×5 IMPLANT
SEALER TISSUE X1 CVD JAW (INSTRUMENTS) ×5 IMPLANT
SET IRRIG TUBING LAPAROSCOPIC (IRRIGATION / IRRIGATOR) ×5 IMPLANT
SHEARS HARMONIC ACE PLUS 36CM (ENDOMECHANICALS) ×5 IMPLANT
SLEEVE ADV FIXATION 5X100MM (TROCAR) ×10 IMPLANT
SLEEVE XCEL OPT CAN 5 100 (ENDOMECHANICALS) ×15 IMPLANT
SPONGE DRAIN TRACH 4X4 STRL 2S (GAUZE/BANDAGES/DRESSINGS) IMPLANT
STAPLE ECHEON FLEX 60 POW ENDO (STAPLE) ×5 IMPLANT
STAPLER CIRC ILS CVD 33MM 37CM (STAPLE) ×5 IMPLANT
STAPLER RELOAD GREEN 60MM (STAPLE) ×10
STAPLER VISISTAT 35W (STAPLE) ×5 IMPLANT
SUT ETHILON 2 0 PS N (SUTURE) ×5 IMPLANT
SUT ETHILON 3 0 PS 1 (SUTURE) IMPLANT
SUT MNCRL AB 4-0 PS2 18 (SUTURE) ×10 IMPLANT
SUT PDS AB 1 CTX 36 (SUTURE) ×10 IMPLANT
SUT PDS AB 1 TP1 96 (SUTURE) IMPLANT
SUT PROLENE 2 0 KS (SUTURE) ×5 IMPLANT
SUT PROLENE 2 0 SH DA (SUTURE) ×5 IMPLANT
SUT SILK 2 0 (SUTURE) ×2
SUT SILK 2 0 SH (SUTURE) ×5 IMPLANT
SUT SILK 2 0 SH CR/8 (SUTURE) ×5 IMPLANT
SUT SILK 2-0 18XBRD TIE 12 (SUTURE) ×3 IMPLANT
SUT SILK 3 0 (SUTURE) ×2
SUT SILK 3 0 SH CR/8 (SUTURE) ×5 IMPLANT
SUT SILK 3-0 18XBRD TIE 12 (SUTURE) ×3 IMPLANT
SUT VIC AB 2-0 UR5 27 (SUTURE) ×5 IMPLANT
SUT VIC AB 3-0 SH 8-18 (SUTURE) ×5 IMPLANT
SUT VICRYL 2 0 18  UND BR (SUTURE) ×2
SUT VICRYL 2 0 18 UND BR (SUTURE) ×3 IMPLANT
SYS LAPSCP GELPORT 120MM (MISCELLANEOUS)
SYSTEM LAPSCP GELPORT 120MM (MISCELLANEOUS) IMPLANT
TOWEL OR 17X26 10 PK STRL BLUE (TOWEL DISPOSABLE) ×5 IMPLANT
TOWEL OR NON WOVEN STRL DISP B (DISPOSABLE) ×5 IMPLANT
TRAY FOLEY BAG SILVER LF 14FR (CATHETERS) IMPLANT
TRAY FOLEY W/METER SILVER 16FR (SET/KITS/TRAYS/PACK) ×5 IMPLANT
TROCAR ADV FIXATION 5X100MM (TROCAR) ×5 IMPLANT
TROCAR BLADELESS OPT 5 100 (ENDOMECHANICALS) IMPLANT
TROCAR XCEL 12X100 BLDLESS (ENDOMECHANICALS) ×5 IMPLANT
TROCAR XCEL BLUNT TIP 100MML (ENDOMECHANICALS) ×5 IMPLANT
TROCAR XCEL NON-BLD 11X100MML (ENDOMECHANICALS) IMPLANT
TUBING ENDO SMARTCAP PENTAX (MISCELLANEOUS) ×5 IMPLANT
TUBING INSUF HEATED (TUBING) ×5 IMPLANT

## 2017-07-26 NOTE — Transfer of Care (Signed)
Immediate Anesthesia Transfer of Care Note  Patient: Donna Brandt  Procedure(s) Performed: LAPAROSCOPIC VS OPEN  LOW ANTERIOR RESECTION WITH DIVERITING LOOP ILEOSTOMY (N/A Abdomen) FLEXIBLE SIGMOIDOSCOPY (Rectum) PROCTOSCOPY (Rectum)  Patient Location: PACU  Anesthesia Type:General  Level of Consciousness: awake  Airway & Oxygen Therapy: Patient Spontanous Breathing and Patient connected to face mask oxygen  Post-op Assessment: Report given to RN and Post -op Vital signs reviewed and stable  Post vital signs: Reviewed and stable  Last Vitals:  Vitals:   07/26/17 0539  BP: 130/81  Pulse: 79  Resp: 18  Temp: 36.7 C  SpO2: 100%    Last Pain:  Vitals:   07/26/17 0539  TempSrc: Oral         Complications: No apparent anesthesia complications

## 2017-07-26 NOTE — H&P (Signed)
History of Present Illness Patient words: Donna Brandt is a 41 year old pleasant female referred to me for evaluation of rectal cancer. She presented to her PCP back in July with bright red blood per rectum and pelvic discomfort. She underwent colonoscopy March 06, 2017 at [facility] which demonstrated a mass in the mid to distal rectum that was by their documentation estimated to be 6-7 cm above the anal verge - the initial biopsies did not return cancer. She subsequently underwent staging CT of the chest/abdomen/pelvis as well as a pelvic MRI. CEA 56. Her staging workup did not demonstrate any evidence of metastatic disease. She then transferred her care to [place] where her support system is located and is no longer residing in [place]. She underwent additional flex/sig by Dr. 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 He labeled the lateral edges with submucosal SPOT. She subsequently was seen by Dr. Benay Spice and our radiation oncology colleagues.  She underwent chemoXRT, completed 05/30/17. She has tolerated the therapy reasonably well - main issues include dyschezia/fecal urgency and dysuria. No f/c. No bleeding per rectum. Denies history of fecal incontinence - reports good control currently and prior. She has seen genetic counselors and tested for mutations, negative.  Her pelvic MRI demonstrated an anteriorly located rectal mass, below peritoneal reflection, 4-5cm in lenght, 3-4cm above the puborectalis, without involvement of the internal/external anal sphincter. 1-32mm of normal fat signal between levator and tumor. Abnormal mesorectal fat signal immediately adjacent to tumor. No suspicious lymph nodes. +Left ovarian cyst. [id number]  Flex sig completed last week - tattoo approximately 2cm from anal  verge  Completed bowel prep and coming out clear  PMH: HTN, well controlled on oral antihypertensive  PSH: Laparoscopic assistated partial hysterectomy for fibroid 46yrs ago; BTL via umbilicus  FHx: Denies family history of malignancy  Social: Denies use of tobacco/EtOH/drugs.   Past Surgical History Breast Augmentation  Bilateral. Breast Biopsy  Bilateral. Colon Polyp Removal - Colonoscopy  Hysterectomy (not due to cancer) - Partial  Tonsillectomy   Diagnostic Studies History Colonoscopy  within last year Mammogram  1-3 years ago Pap Smear  1-5 years ago  Allergies Dilaudid *ANALGESICS - OPIOID*  Latex Exam Gloves *MEDICAL DEVICES AND SUPPLIES*  Allergies Reconciled   Medication History Norvasc (10MG  Tablet, Oral) Active. Xeloda (500MG  Tablet, Oral) Active. Diflucan (150MG  Tablet, Oral) Active. Zofran (8MG  Tablet, Oral) Active. Oxybutynin Chloride (5MG  Tablet, Oral) Active. Pyridium (100MG  Tablet, Oral) Active. Probiotic Advanced (Oral) Active. Compazine (10MG  Capsule ER, Oral) Active. Zoloft (50MG  Tablet, Oral) Active. Imitrex (100MG  Tablet, Oral) Active. Maxzide (75-50MG  Tablet, Oral) Active. Valtrex (500MG  Tablet, Oral) Active. Ibuprofen (600MG  Tablet, Oral) Active. Medications Reconciled  Social History Alcohol use  Occasional alcohol use. Caffeine use  Carbonated beverages.  Family History Breast Cancer  Family Members In General. Cerebrovascular Accident  Father, Mother. Diabetes Mellitus  Family Members In General, Mother. Hypertension  Mother.  Pregnancy / Birth History Age at menarche  28 years. Gravida  3 Length (months) of breastfeeding  3-6 Maternal age  67-25 Para  3  Other Problems  Hemorrhoids  High blood pressure  Rectal Cancer     Review of Systems General Present- Appetite Loss and Fatigue. Not Present- Chills, Fever, Night Sweats, Weight Gain and Weight Loss. Skin Present-  Dryness and Rash. Not Present- Change  in Wart/Mole, Hives, Jaundice, New Lesions, Non-Healing Wounds and Ulcer. HEENT Present- Wears glasses/contact lenses. Not Present- Earache, Hearing Loss, Hoarseness, Nose Bleed, Oral Ulcers, Ringing in the Ears, Seasonal Allergies, Sinus Pain, Sore Throat, Visual Disturbances and Yellow Eyes. Respiratory Present- Snoring. Not Present- Bloody sputum, Chronic Cough, Difficulty Breathing and Wheezing. Breast Not Present- Breast Mass, Breast Pain, Nipple Discharge and Skin Changes. Cardiovascular Not Present- Chest Pain, Difficulty Breathing Lying Down, Leg Cramps, Palpitations, Rapid Heart Rate, Shortness of Breath and Swelling of Extremities. Gastrointestinal Present- Abdominal Pain, Bloating, Bloody Stool, Chronic diarrhea, Constipation, Excessive gas, Hemorrhoids, Nausea and Rectal Pain. Not Present- Change in Bowel Habits, Difficulty Swallowing, Gets full quickly at meals, Indigestion and Vomiting. Female Genitourinary Present- Frequency, Nocturia, Painful Urination, Pelvic Pain and Urgency. Musculoskeletal Not Present- Back Pain, Joint Pain, Joint Stiffness, Muscle Pain, Muscle Weakness and Swelling of Extremities. Neurological Present- Headaches and Weakness. Not Present- Decreased Memory, Fainting, Numbness, Seizures, Tingling, Tremor and Trouble walking. Psychiatric Present- Change in Sleep Pattern and Depression. Not Present- Anxiety, Bipolar, Fearful and Frequent crying. Endocrine Present- Hair Changes and Hot flashes. Not Present- Cold Intolerance, Excessive Hunger, Heat Intolerance and New Diabetes.     Vitals:   07/18/17 0748  BP: 126/84  Resp: 18  Temp: 98.1 F (36.7 C)  TempSrc: Oral  SpO2: 99%  Weight: 87.1 kg (192 lb)  Height: 5\' 7"  (1.702 m)    Physical Exam The physical exam findings are as follows: Note:Constitutional: No acute distress, conversant, no deformities Eyes: Moist conjunctiva, no lid lag, anicteric, pupils equal  round reactive to light Neck: Trachea midline; no thyromegaly Lungs: Normal respiratory effort; no tactile fremitus CV: Regular rate and rhythm, no palpable thrills, no pitting edema GI: abdomen soft, nontender, nondistended, no palpable hepatosplenomegaly Rectal: external perianal skin tags; no palpable mass; poorly tolerated; anoscopy/procto was therefore not attempted in office; eval pending today MSK: Normal gait; no clubbing/cyanosis Psych: Appropriate affect; alert and oriented 3 Lymphatics: No palpable cervical or axillary lymphadenopathy  Assessment & Plan RECTAL CANCER (C20) Impression: Donna Brandt is a pleasant 29F with mid vs low rectal cancer - cT3N0M0 -Completed chemoXRT - 05/30/17 -Plan OR today for laparoscopic vs open low anterior resection; intraop will make determination of double stapled colorectal/anal anastomosis vs handsewn. Will plan diverting loop ileostomy. All other indicated procedures. -She understands her diagnosis well. We discussed the relevant anatomy and physiology of the GI tract and function of the rectum. We discussed the pathophysiology of rectal cancer. We discussed the possible procedures including laparoscopic vs open approaches. We discussed the possibility of performing a LAR with loop ileostomy - colorectal vs coloanal depending on exact location of tattoo once we get down to the pelvic floor. We discussed the principles of TME. We discussed the material risks of surgery (including but not limited to pain, bleeding, need for transfusion, infection, scarring, possibility of permenant stoma, injury to ureter, leak, need for additional procedures, infertility although she reports being done having children, recurrence of her cancer, high stoma output, dehydration, heart attack, stroke, death), benefits and alternatives. We discussed the potential for need for chemotherapy following surgery. Her questions were answered and she has elected to  proceed  Sharon [place]. Dema Severin, M.D. General and Colorectal Surgery [facility]

## 2017-07-26 NOTE — Progress Notes (Signed)
Dr. Dema Severin recently in to see pt. MD aware of pt's pain status and need for something stronger. Rates pain 10/10. See new order received.

## 2017-07-26 NOTE — Anesthesia Postprocedure Evaluation (Signed)
Anesthesia Post Note  Patient: Donna Brandt  Procedure(s) Performed: LAPAROSCOPIC VS OPEN  LOW ANTERIOR RESECTION WITH DIVERITING LOOP ILEOSTOMY (N/A Abdomen) FLEXIBLE SIGMOIDOSCOPY (Rectum) PROCTOSCOPY (Rectum)     Patient location during evaluation: PACU Anesthesia Type: General Level of consciousness: awake Pain management: pain level controlled Respiratory status: spontaneous breathing Cardiovascular status: stable Anesthetic complications: no    Last Vitals:  Vitals:   07/26/17 0539 07/26/17 1304  BP: 130/81 116/73  Pulse: 79 76  Resp: 18 16  Temp: 36.7 C 36.6 C  SpO2: 100%     Last Pain:  Vitals:   07/26/17 0539  TempSrc: Oral                 Marvetta Vohs

## 2017-07-26 NOTE — Op Note (Signed)
07/26/2017  12:51 PM  PATIENT:  Donna Brandt  41 y.o. female  Patient Care Team: Patient, No Pcp Per as PCP - General (General Practice)  PRE-OPERATIVE DIAGNOSIS:  rectal cancer  POST-OPERATIVE DIAGNOSIS:  rectal cancer  PROCEDURE:  1. Laparoscopic low anterior resection 2. Diverting loop ileostomy 3. Flexible sigmoidoscopy  SURGEON:  Sharon Mt. Dema Severin, MD  ASSISTANT: Neysa Bonito MD  ANESTHESIA:   general  COUNTS:  Sponge, needle and instrument counts were reported correct x2 at the conclusion of the operation.  EBL: 50cc  DRAINS: 19 Fr Blake drain - draining the pelvis.  SPECIMEN: Rectum and sigmoid colon  FINDINGS: Small mass in left anterolateral distal rectum ~2cm from anal verge. The tattoo was found during the course of the dissection. The mass was palpable on exam. Rectum stapled well below tumor and specimen examined - distal margin >1cm. Sigmoid/descending colon junction selected as proximal point of transection above the IMA pedicle. Double stapled colorectal anastomosis. Diverting loop ileostomy fashioned approximately 40cm from ileocecal valve.  DISPOSITION: PACU in satisfactory condition  INDICATION: Donna Brandt is a 41 year old pleasant female referred to me for evaluation of rectal cancer. She presented to her PCP back in July with bright red blood per rectum and pelvic discomfort. She underwent colonoscopy March 06, 2017 at [facility] which demonstrated a mass in the mid to distal rectum that was by their documentation estimated to be 6-7 cm above the anal verge - the initial biopsies did not return cancer. She subsequently underwent staging CT of the chest/abdomen/pelvis as well as a pelvic MRI. CEA 56. Her staging workup did not demonstrate any evidence of metastatic disease. She then transferred her care to [facility] where her support system is located and is no longer residing in [place]. She underwent additional flex/sig by Dr. 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 This was tattoo'd.  SheunderwentchemoXRT, completed10/24/18. She has tolerated the therapy reasonably well - main issues include dyschezia/fecal urgency and dysuria.No f/c. No bleeding per rectum. Denies history of fecal incontinence - reports good control currently and prior. She has seen genetic counselors and tested for mutations, negative.  Her pelvic MRI demonstrated an anteriorly located rectal mass, below peritoneal reflection, 4-5cm in lenght, 3-4cm above the puborectalis, without involvement of the internal/external anal sphincter. The shortest distance from tumor to anal sphincter on her post-treatment MRI was 4.9cm and the tumor was left anterolateral. No suspicious lymph nodes. +Left ovarian cyst. Post-treatment MRI demonstrated cT1/T2N0 rectal cancer.  The anatomy and physiology of the GI was described in detail using pictures/diagrams. The pathophysiology of rectal cancer and multidisciplinary approach to its treatment was also detailed. She understands her diagnosis well. We discussed the relevant anatomy and physiology of the GI tract and function of the rectum. We discussed the pathophysiology of rectal cancer. We discussed the possible procedures including laparoscopic vs open approaches. We discussed the possibility of performing a LAR with loop ileostomy - colorectal vs coloanal depending on exact location of tattoo once we get down to the pelvic floor. We discussed the principles of TME. We discussed the material risks of surgery (including but not limited to pain, bleeding, need for transfusion, infection, scarring, possibility of permenant stoma, injury to ureter, leak, need for additional procedures, infertility although she reports being done having children, recurrence  of  her cancer, fecal incontinence, high stoma output, dehydration, heart attack, stroke, death), benefits and alternatives. We discussed the potential for need for chemotherapy following surgery. Her questions were answered and she has elected to proceed  DESCRIPTION: The patient was identified in preop holding and taken to the OR where she was placed on the operating room table and SCDs were placed. General endotracheal anesthesia was induced without difficulty. A foley catheter was placed. The patient was then positioned in lithotomy and all pressure points padded. The hair at all planned surgical sites was clipped as necessary. The patient was then prepped and draped in the standard sterile fashion. A surgical timeout was performed indicating the correct patient, procedure, positioning and need for preoperative antibiotics.   Local anesthetic consisting of marcaine+Exparel was infiltrated in the skin of all port sites. A small infraumbilical incision was made. The subcutaneous tissue was dissected with electrocautery and the umbilical stalk was identified and grasped with a Kocher and retracted outwardly. The infraumbilical fascia was identified and incised sharply. The peritoneal cavity was then entered bluntly. An 0 vicryl figure-of-eight stay suture was placed.  A Hassan cannula was then inserted into the perineal cavity.  Pneumoperitoneum was established to 15 mmHg with CO2.  Lap scope was inserted to the perineal cavity and inspection revealed no evidence of injury from trocar placement.  The peritoneal linning, overlying omentum and visible small bowel was then surveyed as well as the liver and no abnormal findings were identified.  3 additional 5 mm working ports were placed-2 in the right abdomen and one in the left abdomen.  The patient was then positioned in steep Trendelenburg with some right side down.  The small bowel was swept out of the pelvis. The sigmoid colon and intraperitoneal rectum were  visualized.  Beginning with a lateral to medial approach the sigmoid colon was mobilized off the intersigmoid fossa sharply staying in an avascular plane.  Within the intersigmoid fossa, the left ureter was readily identified and preserved free of injury.  At this point the rectosigmoid colon was retracted anteriorly and the peritoneum on the right side overlying the sacral promontory was scored with electrocautery.  The avascular plane between the presacral fascia and fascia propria of the rectum was identified and entered.  The peritoneum was opened cephalad and caudad.  Retracting the rectosigmoid colon cephalad, the pelvic dissection commenced sharply.  Both the left and right ureter were identified and protected during the dissection.  The dissection proceeded posteriorly first.  The hypogastric nerve and his left and right branches were identified and protected free of injury.  Working in the posterior plane deep within the pelvis, the rectus sacral (Waldeyer's fascia) was identified and opened as the dissection continued posteriorly.  Following near completion of the posterior dissection, dissection commenced laterally followed by anteriorly.  During the lateral dissection, care was taken to stay out of both pelvic sidewalls and working medial to both ureters.  During the anterior dissection, retraction and care was taken to elevate the vagina off the anterior wall of the rectum again staying in the avascular plane.  The dissection continued down to the level of the levator fascia the posterior and laterally.  The dissection was met anteriorly in the rectovaginal septum.  Deep within the pelvis, the anterior and lateral regions of tattoo was identified.  Digital examination from below was used to confirm the tumor location and the level of dissection.  The dissection terminated at the level of the levators circumferentially and on  digital exam within a centimeter of the anal verge.  The sphincter muscles were  preserved throughout this.  A powered Echelon Endo GIA with a green load was used to divide the distal most portion of rectum 1.5-2 cm below the mass.  This was done with 2 firings of the stapler.  The rectum was delivered out of the pelvis, the pelvis was irrigated and hemostasis verified.  Attention was then turned to division of the IMA pedicle.  The IMA was identified, the peritoneum overlying this portion of mesentery was opened and the IMA was circumferentially dissected.  The location of the left ureter was again confirmed and found to be well lateral to the pedicle of the IMA. The IMA was divided approximately 1 cm from its takeoff from the aorta using the Enseal energy device.  The divided pedicle was inspected and noted to be hemostatic.  The IMV was additionally divided at the inferior border of the pancreas using the Enseal energy device.  The pedicle was inspected and noted to be hemostatic.  After division, the planned location of the colorectal anastomosis was selected in an area that was pink and well perfused at the approximate level of the junction between the descending/sigmoid colon.  The descending colon was mobilized along the Nanette Wirsing line of [place] and there was more than adequate length to reach the pelvic floor without any tension whatsoever.  The mesentery was then divided up to the level of the planned point of colon transection using the Enseal energy device.  Attention was then turned to performing the colorectal anastomosis.  A 6-7 cm Pfannenstiel incision and deepened with electrocautery.  The anterior sheath of the rectus fascia was identified and opened transversely.  Superior and inferior flaps were created between the anterior rectus sheath and the rectus muscle.  The midline was identified between the 2 rectus muscles and with established pneumoperitoneum, the peritoneum was opened.  An [place] wound protector was placed.  The rectum and sigmoid colon was then delivered through this  wound protector and the planned site of transection was identified.  The colon at this level was pink and well-perfused.  A pursestring device was then applied and a 0 Prolene suture was passed through this device.  The colon just distal to the pursestring device was then transected and passed off as specimen.  The pursestring device was removed and the lumen visualized.  Additional 3-0 silk sutures were placed to secure the pursestring to the colon.  EEA sizers were used and a 30 mm EEA anastomosis was planned.  The anvil was placed within the lumen and the pursestring tied.  The anastomosis was prepared and no diverticula were noted to be within the proximal portion of then planned staple line.  At this point, the specimen was then opened on the back table. This was done anterior and on the proximal rectum so as not to distort pathologic assessment of the specimen.  The distal rectum was then intussuscepted out of the anterior proctotomy and the tumor was visualized.  There was over 1 cm from the distal portion of the tumor to the staple line.  Gowns/gloves were then changed  The Alexis wound protector cap was then placed and pneumoperitoneum reestablished.  To ensure that the bladder was free of any injuries during her dissection a leak test was performed with 400 cc of dilute methylene and there was no evidence of injury to the bladder.   At this point the procedure, I went down to  the perineum to pass the stapler with my assistant working from above.  A flex sig scope was passed into the anal canal and the pelvis was then filled with sterile saline.  There was no leak from the blind ending staple line.  Inspection of the staple line intraluminally revealed no evidence of other lesions nor tattoo.  Well lubricated EEA sizers were then passed in the anal canal.  The 33 mm lubricated EEA stapler was then passed and once abutting the staple line the spike was deployed.  The colon and distal most aspect of  rectum were then mated and the stapler closed and fired.  The colon was occluded proximally and the pelvis was again filled with sterile saline.  The flexible sigmoidoscope was then inserted and inspection of the anastomosis demonstrated a pink, hemostatic, patent anastomosis and there was no air leak.  The irrigation was then evacuated.  A 19 French round Blake drain was placed draining the pelvis and secured to the skin with a 2-0 nylon suture  Attention was then turned to creating the diverting loop ileostomy.  The terminal ileum was identified at the junction with the cecum and traced 40 cm proximal to the ileocecal valve.  Orientation was then confirmed in the distal aspect was marked with a purple marking pen laparoscopically.  A wheel of skin at the premarked site for the ileostomy was then excised as was the underlying subcu venous tissue.  The anterior sheath of the rectus fascia was identified and incised in a cruciate manner.  The rectus muscle was then spread bluntly and the posterior rectus sheath was identified.  The peritoneum was then opened and the loop of distal ileum was then brought out to the level of the skin.  The abdomen was reinspected and hemostasis verified.  The orientation of the ileostomy was again confirmed ensuring there is no twisting or kinks of the ileum leading up to the abdominal wall or distally.  The trocars were then removed under direct visualization as well as the [place] wound protector.  Gowns and gloves of all participating members of the operating team were then changed and the abdomen was redraped with sterile drapes and a separate closing set was obtained.  Attention was directed at closing the Pfannenstiel incision.  The peritoneum was closed with a running 2-0 Vicryl suture.  The fascia was then approximated using running #1 PDS sutures.  The skin of all incision sites was then closed with 4-0 Monocryl subcuticular sutures.  The incisions were covered with  Dermabond.  Blue towels were used to isolate the ileostomy from all other incisions.  A window was created in the mesentery of the loop ileostomy and a stoma rod was passed.  This was then secured to the skin with silk sutures.  The loop of ileum was then opened and the proximal end Brooked with 3-0 Vicryl suture.  The distal limb was secured to the skin with interrupted 3-0 Vicryl sutures.  An ostomy appliance was then cut to size and placed over the stoma.  The patient was then taken out of the lithotomy position, extubated, and transferred to a stretcher for transport to PACU in satisfactory condition.    Note: This dictation was prepared with Dragon/digital dictation along with [employer]. Any transcriptional errors that result from this process are unintentional.

## 2017-07-26 NOTE — Brief Op Note (Signed)
07/26/2017  12:41 PM  PATIENT:  Donna Brandt  41 y.o. female  PRE-OPERATIVE DIAGNOSIS:  rectal cancer  POST-OPERATIVE DIAGNOSIS:  rectal cancer  PROCEDURE:  Procedure(s): LAPAROSCOPIC VS OPEN  LOW ANTERIOR RESECTION WITH DIVERITING LOOP ILEOSTOMY (N/A) FLEXIBLE SIGMOIDOSCOPY PROCTOSCOPY  SURGEON:  Surgeon(s) and Role:    * Jamaurie Bernier, Sharon Mt, MD - Primary  PHYSICIAN ASSISTANT:   ASSISTANTS: Michael Boston MD   ANESTHESIA:   general  EBL:  50 mL   BLOOD ADMINISTERED:50cc  DRAINS: 19Fr Blake drain, draining pelvis   LOCAL MEDICATIONS USED:  Marcaine+Exparel  SPECIMEN: 1. Rectum + sigmoid; opened anteriorly well above tumor 2. Distal donut  DISPOSITION OF SPECIMEN:  Pathology  COUNTS:  Counts correct x2  TOURNIQUET:  * No tourniquets in log *  DICTATION: .Dragon Dictation  PLAN OF CARE: Admit to inpatient   PATIENT DISPOSITION:  PACU - hemodynamically stable.   Delay start of Pharmacological VTE agent (>24hrs) due to surgical blood loss or risk of bleeding: no

## 2017-07-26 NOTE — Anesthesia Procedure Notes (Signed)
Procedure Name: Intubation Date/Time: 07/26/2017 7:40 AM Performed by: [place] Hser Belanger C, CRNA Pre-anesthesia Checklist: Patient identified, Emergency Drugs available, Suction available and Patient being monitored Patient Re-evaluated:Patient Re-evaluated prior to induction Oxygen Delivery Method: Circle system utilized Preoxygenation: Pre-oxygenation with 100% oxygen Induction Type: IV induction, Rapid sequence and Cricoid Pressure applied Laryngoscope Size: Mac and 3 Tube type: Oral Number of attempts: 1 Airway Equipment and Method: Stylet Placement Confirmation: ETT inserted through vocal cords under direct vision,  positive ETCO2 and breath sounds checked- equal and bilateral Tube secured with: Tape Dental Injury: Teeth and Oropharynx as per pre-operative assessment

## 2017-07-27 ENCOUNTER — Encounter (HOSPITAL_COMMUNITY): Payer: Self-pay | Admitting: Surgery

## 2017-07-27 LAB — CBC
HCT: 30.7 % — ABNORMAL LOW (ref 36.0–46.0)
Hemoglobin: 10.3 g/dL — ABNORMAL LOW (ref 12.0–15.0)
MCH: 27 pg (ref 26.0–34.0)
MCHC: 33.6 g/dL (ref 30.0–36.0)
MCV: 80.4 fL (ref 78.0–100.0)
PLATELETS: 177 10*3/uL (ref 150–400)
RBC: 3.82 MIL/uL — AB (ref 3.87–5.11)
RDW: 14.1 % (ref 11.5–15.5)
WBC: 7.9 10*3/uL (ref 4.0–10.5)

## 2017-07-27 LAB — BASIC METABOLIC PANEL
Anion gap: 5 (ref 5–15)
BUN: 11 mg/dL (ref 6–20)
CALCIUM: 10 mg/dL (ref 8.9–10.3)
CO2: 26 mmol/L (ref 22–32)
CREATININE: 0.65 mg/dL (ref 0.44–1.00)
Chloride: 107 mmol/L (ref 101–111)
GFR calc non Af Amer: >60 mL/min (ref >60)
Glucose, Bld: 112 mg/dL — ABNORMAL HIGH (ref 65–99)
Potassium: 3.9 mmol/L (ref 3.5–5.1)
SODIUM: 138 mmol/L (ref 135–145)

## 2017-07-27 MED ORDER — OXYCODONE-ACETAMINOPHEN 10-325 MG PO TABS: 1.0000 | ORAL_TABLET | Freq: Four times a day (QID) | ORAL | 0 refills | Status: AC | PRN

## 2017-07-27 NOTE — Progress Notes (Signed)
Stoma rod sutures were removed today during her consultation with our Loralie Champagne. She had some issues with pouching with the rod interfering. The rod was placed inside the stoma appliance. To prevent stomal disruption during bag changes, I went ahead and exchanged the rod for a soft red rubber which we will keep and remove on the day of her discharge  Sharon Mt. Dema Severin, M.D. [facility]

## 2017-07-27 NOTE — Discharge Instructions (Signed)
POST OP INSTRUCTIONS  1. DIET: As tolerated. Follow a light bland diet the first 24 hours after arrival home, such as soup, liquids, crackers, etc.  Be sure to include lots of fluids daily.  Avoid fast food or heavy meals as your are more likely to get nauseated.  Eat a low fat the next few days after surgery.  2. Take your usually prescribed home medications unless otherwise directed.  3. PAIN CONTROL: a. Pain is best controlled by a usual combination of three different methods TOGETHER: i. Ice/Heat ii. Over the counter pain medication iii. Prescription pain medication b. Most patients will experience some swelling and bruising around the surgical site.  Ice packs or heating pads (30-60 minutes up to 6 times a day) will help. Some people prefer to use ice alone, heat alone, alternating between ice & heat.  Experiment to what works for you.  Swelling and bruising can take several weeks to resolve.   c. It is helpful to take an over-the-counter pain medication regularly for the first few weeks: i. Ibuprofen (Motrin/Advil) - 200mg  tabs - take 3 tabs (600mg ) every 6 hours as needed for pain ii. Acetaminophen (Tylenol) - you may take 650mg  every 6 hours as needed. You can take this with motrin as they act differently on the body. If you are taking a narcotic pain medication that has acetaminophen in it, do not take over the counter tylenol at the same time.  Iii. NOTE: You may take both of these medications together - most patients  find it most helpful when alternating between the two (i.e. Ibuprofen at 6am,  tylenol at 9am, ibuprofen at 12pm ...) d. A  prescription for pain medication should be given to you upon discharge.  Take your pain medication as prescribed if your pain is not adequatly controlled with the over-the-counter pain reliefs mentioned above.  4. Ileostomy: Continue to apply the pouch as directed by our Wound Ostomy Nurse in the hospital. Measure and record your daily output. While  you have an ileostomy, hydration is one of the most important aspects of your postoperative care moving forward. You should try to stay hydrated with a sports drink (gatorade, etc) as these also have electrolytes which help. Drink at least 64oz of liquids per day. One way to monitor hydration is to watch how much urine you are producing. In a well hydrated state, you should urinate 4-5x/day. If your stoma output is particularly watery or problematic, adding a daily fiber supplement (Metamucil, fibercon, citrucel, benefiber, Konsyl, etc) is often helpful. The output should be thicker consistency similar to pureed foods or wet toothpaste. If your output is >1.2L per 24hrs (1267mL) it is important to start taking over the counter Imodium. You can start with a 2mg  tablet with breakfast, lunch and dinner +/- bedtime. If still problematic, give [place] a call - you can take 2 tablets of Imodium at breakfast and bedtime with 1 tablet at lunch and dinner. The "maximum" dose is 2 tablets, four times per day (2 tabs with breakfast/lunch/dinner/bedtime). There are additionally other medications that can be prescribed should this be insufficient.  5. Dressing: Your incision is covered in Dermabond which is like sterile superglue for the skin. This will come off on it's own in a couple weeks. It is waterproof and you may bathe normally starting the day after your surgery in a shower. Avoid baths/pools/lakes/oceans until your wounds have fully healed.  6. ACTIVITIES as tolerated:   a. Avoid heavy lifting (>10lbs or  1 gallon of milk) for the next 6 weeks. b. You may resume regular (light) daily activities beginning the next day--such as daily self-care, walking, climbing stairs--gradually increasing activities as tolerated.  If you can walk 30 minutes without difficulty, it is safe to try more intense activity such as jogging, treadmill, bicycling, low-impact aerobics.  c. DO NOT PUSH THROUGH PAIN.  Let pain be your guide: If it  hurts to do something, don't do it. d. Dennis Bast may drive when you are no longer taking prescription pain medication, you can comfortably wear a seatbelt, and you can safely maneuver your car and apply brakes.  e. Nothing per rectum (medications, etc) f. You may have sexual intercourse when it is comfortable.   7. FOLLOW UP in our office a. Please call [facility] at [phone] to set up an appointment to see your surgeon in the office for a follow-up appointment approximately 2 weeks after your surgery. b. Make sure that you call for this appointment the day you arrive home to insure a convenient appointment time.  9. If you have disability or family leave forms that need to be completed, you may have them completed by your primary care physician's office; for return to work instructions, please ask our office staff and they will be happy to assist you in obtaining this documentation   When to call us [phone]: 1. Poor pain control 2. Reactions / problems with new medications (rash/itching, etc)  3. Fever over 101.5 F (38.5 C) 4. Inability to urinate 5. Nausea/vomiting 6. Worsening swelling or bruising 7. Continued bleeding from incision. 8. Increased pain, redness, or drainage from the incision  The clinic staff is available to answer your questions during regular business hours (8:30am-5pm).  Please dont hesitate to call and ask to speak to one of our nurses for clinical concerns.   A surgeon from [facility] is always on call at the hospitals   If you have a medical emergency, go to the nearest emergency room or call 911.  [facility], [place] [address] [place], [place], [place]  [address] MAIN: [phone] FAX: [phone] [email or web address]

## 2017-07-27 NOTE — Progress Notes (Signed)
Colostomy bag was leaking,  Had to replace it with new bag.  Appears to be doing well now.  Will consult ostomy nurse.

## 2017-07-27 NOTE — Consult Note (Addendum)
[place] Nurse ostomy consult note: POD #1 Stoma type/location: RUQ loop ileostomy with [employer] rod suture in place at two site. Patient with pouch leakage overnight and is leaking at time of my visit (8:30am) Stomal assessment/size: 1 and 1/2 inch Peristomal assessment: intact but with mild erythema due to leakage and two pouch removals Treatment options for stomal/peristomal skin: Two sutures cut from ostomy rod for improved access with cleansing and skin protection.  A skin barrier ring is flattened and fashioned around ostomy (beneath rod). 2-piece pouching system (2 and 3/4 inch) is fashioned over ileostomy with rod on top of skin barrier. Pouch applied. Output: thick green effluent in a small amount Ostomy pouching: 2pc. 2 and 3/4 inch with skin barrier ring Education provided: Extended session to teach GI A&P, stoma characteristics, pouch characteristics, purpose of rod, mechanics of rod.  Pouch removal and goal of "first fit, then fashion" discussed.  Sutures of rod cut (2). Stoma sized, skin cleansed, skin barrier ring placed. Pouching system placed with rod intact and on top of system.  Pouch attached.  Lock and Roll closure demonstrated and patient performs x3. Husband performs x1. Emptying (simulated) with toilet paper wicks taught along with rationale.  Diet taught (BRAT) and to chew thoroughly and drink plenty of fluids.  Risk of dehydration taught.  Educational booklet provided. At end of session, patient's son and his friend, who will be the patient's primary caregiver arrived.  Highlights of session repeated for them all. Caregiver is able to perform Lock and Roll closure and understands the mechanics of emptying, although we do not perform as there is nothing in the patient's pouch.  Noted is MD request to have rod discontinued on POD 3 (Sunday).  There are no WOC Nursing services on Sunday, so rod will be removed on Monday, POD 4).  Enrolled patient in [place] program: No.  Permission card signed and not mailed.

## 2017-07-27 NOTE — Progress Notes (Addendum)
Subjective No acute events. Stoma appliance had some leakage overnight - was changed. Has stoma rod in place. Denies n/v. Pain well controlled with PCA  Objective: Vital signs in last 24 hours: Temp:  [97.6 F (36.4 C)-99 F (37.2 C)] 98.1 F (36.7 C) (12/21 0530) Pulse Rate:  [63-86] 68 (12/21 0530) Resp:  [14-20] 20 (12/21 0530) BP: (102-142)/(66-86) 102/67 (12/21 0530) SpO2:  [95 %-100 %] 100 % (12/21 0530) Last BM Date: 07/25/17  Intake/Output from previous day: 12/20 0701 - 12/21 0700 In: 2870 [I.V.:2870] Out: 3465 [Urine:3275; Drains:140; Blood:50] Intake/Output this shift: No intake/output data recorded.  Gen: NAD, comfortable CV: RRR Pulm: Normal work of breathing Abd: Soft, NT, ?mildly distended; stoma pink, some output in appliance; incisions c/d/i without erythema; stoma rod in place Ext: SCDs in place  Lab Results: CBC  Recent Labs    07/26/17 1616 07/27/17 0545  WBC 8.1 7.9  HGB 12.3 10.3*  HCT 36.4 30.7*  PLT 191 177   BMET Recent Labs    07/26/17 1616 07/27/17 0545  NA  --  138  K  --  3.9  CL  --  107  CO2  --  26  GLUCOSE  --  112*  BUN  --  11  CREATININE 0.86 0.65  CALCIUM  --  10.0   PT/INR No results for input(s): LABPROT, INR in the last 72 hours. ABG No results for input(s): PHART, HCO3 in the last 72 hours.  Invalid input(s): PCO2, PO2  Studies/Results:  Anti-infectives: Anti-infectives (From admission, onward)   Start     Dose/Rate Route Frequency Ordered Stop   07/26/17 0700  cefoTEtan in Dextrose 5% (CEFOTAN) 2-2.08 GM-%(50ML) IVPB    Comments:  [place]  : cabinet override      07/26/17 0700 07/26/17 0744   07/26/17 0605  cefoTEtan in Dextrose 5% (CEFOTAN) IVPB 2 g     2 g Intravenous On call to O.R. 07/26/17 0605 07/26/17 0759       Assessment/Plan: Patient Active Problem List   Diagnosis Date Noted  . Rectal cancer ([place]) 07/26/2017  . Genetic testing 05/08/2017  . Family history of breast cancer    . 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 Mccartney is a 76yoF with hx of low rectal cancer s/p laparoscopic low anterior resection with double stapled colorectal anastomosis and diverting loop ileostomy 07/26/17  -Recovering well -Continue foley today - will plan void trial tomorrow -Full liquid diet - discussed with her going easy on the liquids - if nauseated/bloated to back off -Ambulate 5x/day -Continue PCA until tolerating diet -Monitor stoma output - if >1.2L/day, will plan to replace 1:1 for anything over 1.2L and start metamucil; may additionally need imodium, will keep an eye -Stoma rod in loop ileostomy to be removed around POD#3/4 -PPx: SQH, SCDs -Dispo: awaiting toleration of diet, pain control on oral analgesics, controlled stoma output. Home health consult placed   LOS: 1 day   Sharon [place]. Dema Severin, M.D. General and Colorectal Surgery [facility]

## 2017-07-28 LAB — BASIC METABOLIC PANEL
Anion gap: 3 — ABNORMAL LOW (ref 5–15)
BUN: 10 mg/dL (ref 6–20)
CHLORIDE: 106 mmol/L (ref 101–111)
CO2: 30 mmol/L (ref 22–32)
CREATININE: 0.71 mg/dL (ref 0.44–1.00)
Calcium: 9.9 mg/dL (ref 8.9–10.3)
GFR calc Af Amer: >60 mL/min (ref >60)
GFR calc non Af Amer: >60 mL/min (ref >60)
Glucose, Bld: 104 mg/dL — ABNORMAL HIGH (ref 65–99)
Potassium: 3.7 mmol/L (ref 3.5–5.1)
Sodium: 139 mmol/L (ref 135–145)

## 2017-07-28 LAB — CBC WITH DIFFERENTIAL/PLATELET
Basophils Absolute: 0 10*3/uL (ref 0.0–0.1)
Basophils Relative: 0 %
Eosinophils Absolute: 0.1 10*3/uL (ref 0.0–0.7)
Eosinophils Relative: 1 %
HEMATOCRIT: 30.4 % — AB (ref 36.0–46.0)
HEMOGLOBIN: 9.9 g/dL — AB (ref 12.0–15.0)
LYMPHS ABS: 0.4 10*3/uL — AB (ref 0.7–4.0)
LYMPHS PCT: 10 %
MCH: 26.8 pg (ref 26.0–34.0)
MCHC: 32.6 g/dL (ref 30.0–36.0)
MCV: 82.4 fL (ref 78.0–100.0)
MONOS PCT: 9 %
Monocytes Absolute: 0.4 10*3/uL (ref 0.1–1.0)
NEUTROS ABS: 3.4 10*3/uL (ref 1.7–7.7)
NEUTROS PCT: 80 %
Platelets: 150 10*3/uL (ref 150–400)
RBC: 3.69 MIL/uL — ABNORMAL LOW (ref 3.87–5.11)
RDW: 14.2 % (ref 11.5–15.5)
WBC: 4.2 10*3/uL (ref 4.0–10.5)

## 2017-07-28 LAB — PHOSPHORUS: Phosphorus: 2.9 mg/dL (ref 2.5–4.6)

## 2017-07-28 LAB — MAGNESIUM: Magnesium: 1.9 mg/dL (ref 1.7–2.4)

## 2017-07-28 MED ORDER — MORPHINE SULFATE (PF) 2 MG/ML IV SOLN
2.0000 mg | INTRAVENOUS | Status: DC | PRN
  Administered 2017-07-28 (×2): 2 mg via INTRAVENOUS
  Filled 2017-07-28 (×2): qty 1

## 2017-07-28 NOTE — Progress Notes (Signed)
Pt independently emptying ostomy this shift.

## 2017-07-28 NOTE — Progress Notes (Signed)
Subjective No acute events. Pain well controlled. Tolerating fulls, having ostomy output  Objective: Vital signs in last 24 hours: Temp:  [98.6 F (37 C)-99.3 F (37.4 C)] 98.6 F (37 C) (12/22 0630) Pulse Rate:  [60-70] 61 (12/22 0630) Resp:  [14-20] 15 (12/22 0624) BP: (98-108)/(65-75) 108/72 (12/22 0630) SpO2:  [100 %] 100 % (12/22 0630) Last BM Date: 07/27/17  Intake/Output from previous day: 12/21 0701 - 12/22 0700 In: 1228.8 [I.V.:1228.8] Out: 1480 [Urine:725; Drains:255; Stool:500] Intake/Output this shift: No intake/output data recorded.  Gen: NAD, comfortable CV: RRR Pulm: Normal work of breathing Abd: Soft, NT, nondistended; stoma pink, some output in appliance; incisions c/d/i without erythema; red rubber in place; JP output serous Ext: SCDs in place  Lab Results: CBC  Recent Labs    07/27/17 0545 07/28/17 0501  WBC 7.9 4.2  HGB 10.3* 9.9*  HCT 30.7* 30.4*  PLT 177 150   BMET Recent Labs    07/27/17 0545 07/28/17 0501  NA 138 139  K 3.9 3.7  CL 107 106  CO2 26 30  GLUCOSE 112* 104*  BUN 11 10  CREATININE 0.65 0.71  CALCIUM 10.0 9.9   PT/INR No results for input(s): LABPROT, INR in the last 72 hours. ABG No results for input(s): PHART, HCO3 in the last 72 hours.  Invalid input(s): PCO2, PO2  Studies/Results:  Anti-infectives: Anti-infectives (From admission, onward)   Start     Dose/Rate Route Frequency Ordered Stop   07/26/17 0700  cefoTEtan in Dextrose 5% (CEFOTAN) 2-2.08 GM-%(50ML) IVPB    Comments:  [place]  : cabinet override      07/26/17 0700 07/26/17 0744   07/26/17 0605  cefoTEtan in Dextrose 5% (CEFOTAN) IVPB 2 g     2 g Intravenous On call to O.R. 07/26/17 0605 07/26/17 0759       Assessment/Plan: Patient Active Problem List   Diagnosis Date Noted  . Rectal cancer ([place]) 07/26/2017  . Genetic testing 05/08/2017  . Family history of breast cancer   . 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 Stelly  is a 63yoF with hx of low rectal cancer s/p laparoscopic low anterior resection with double stapled colorectal anastomosis and diverting loop ileostomy 07/26/17  -Recovering well -Dc foley today -Soft diet today -Ambulate 5x/day -DC PCA today -Monitor stoma output - if >1.2L/day, will plan to replace 1:1 for anything over 1.2L and start metamucil; may additionally need imodium, will keep an eye -Stoma red rubber in loop ileostomy to be removed around discharge -PPx: SQH, SCDs -Dispo: awaiting toleration of diet, pain control on oral analgesics, controlled stoma output. Home health consult placed   LOS: 2 days   [facility]

## 2017-07-29 LAB — BASIC METABOLIC PANEL
Anion gap: 5 (ref 5–15)
BUN: 9 mg/dL (ref 6–20)
CALCIUM: 9.5 mg/dL (ref 8.9–10.3)
CO2: 28 mmol/L (ref 22–32)
CREATININE: 0.6 mg/dL (ref 0.44–1.00)
Chloride: 106 mmol/L (ref 101–111)
GFR calc Af Amer: >60 mL/min (ref >60)
GFR calc non Af Amer: >60 mL/min (ref >60)
GLUCOSE: 109 mg/dL — AB (ref 65–99)
Potassium: 3.6 mmol/L (ref 3.5–5.1)
Sodium: 139 mmol/L (ref 135–145)

## 2017-07-29 LAB — CBC WITH DIFFERENTIAL/PLATELET
BASOS PCT: 0 %
Basophils Absolute: 0 10*3/uL (ref 0.0–0.1)
Eosinophils Absolute: 0.1 10*3/uL (ref 0.0–0.7)
Eosinophils Relative: 1 %
HEMATOCRIT: 30.8 % — AB (ref 36.0–46.0)
Hemoglobin: 10.2 g/dL — ABNORMAL LOW (ref 12.0–15.0)
LYMPHS PCT: 7 %
Lymphs Abs: 0.5 10*3/uL — ABNORMAL LOW (ref 0.7–4.0)
MCH: 26.8 pg (ref 26.0–34.0)
MCHC: 33.1 g/dL (ref 30.0–36.0)
MCV: 80.8 fL (ref 78.0–100.0)
MONO ABS: 0.5 10*3/uL (ref 0.1–1.0)
MONOS PCT: 8 %
NEUTROS ABS: 5.3 10*3/uL (ref 1.7–7.7)
Neutrophils Relative %: 84 %
Platelets: 171 10*3/uL (ref 150–400)
RBC: 3.81 MIL/uL — ABNORMAL LOW (ref 3.87–5.11)
RDW: 14 % (ref 11.5–15.5)
WBC: 6.3 10*3/uL (ref 4.0–10.5)

## 2017-07-29 LAB — PHOSPHORUS: Phosphorus: 2.5 mg/dL (ref 2.5–4.6)

## 2017-07-29 LAB — MAGNESIUM: Magnesium: 1.7 mg/dL (ref 1.7–2.4)

## 2017-07-29 MED ORDER — MAGNESIUM SULFATE 2 GM/50ML IV SOLN
2.0000 g | Freq: Once | INTRAVENOUS | Status: AC
  Administered 2017-07-29: 2 g via INTRAVENOUS
  Filled 2017-07-29: qty 50

## 2017-07-29 MED ORDER — POTASSIUM CHLORIDE CRYS ER 20 MEQ PO TBCR
40.0000 meq | EXTENDED_RELEASE_TABLET | Freq: Once | ORAL | Status: AC
  Administered 2017-07-29: 40 meq via ORAL
  Filled 2017-07-29: qty 2

## 2017-07-29 NOTE — Consult Note (Signed)
[place] Nurse ostomy consult note:  Stoma type/location: RUQ loop ileostomy from surgery on 12/20 Stomal assessment/size: 1 and 1/2 inch, red and viable, slightly above skin level Peristomal assessment: Intact skin surrounding Treatment options for stomal/peristomal skin: Discontinued red rubber rod as requested.  A skin barrier ring is flattened and fashioned around ostomy, then applied one piece convex pouch.  Output: Mod amt green liquid stool Education provided: Pt was able to apply barrier ring and convex pouch with minimal assistance.  She is able to open and close velcro to empty without assistance.  Discussed dietary precautions and avoiding dehydration.  Reviewed pouching routines and ordering supplies.  Pt asked appropriate questions. Placed on Progress discharge program and 6 barrier rings and 6 convex pouches left at the bedside for impending discharge home tomorrow. Please re-consult if further assistance is needed.  Thank-you,  Julien Girt MSN, [place], [place], [place], [place]

## 2017-07-29 NOTE — Progress Notes (Signed)
Subjective Continues to do well. Pain control adequate. Would like teaching from ostomy nurse before DC.  Objective: Vital signs in last 24 hours: Temp:  [98.6 F (37 C)-99.2 F (37.3 C)] 99.2 F (37.3 C) (12/23 0443) Pulse Rate:  [70-78] 70 (12/23 0443) Resp:  [11-12] 11 (12/23 0443) BP: (123-130)/(80-83) 130/83 (12/23 0443) SpO2:  [99 %-100 %] 100 % (12/23 0443) Last BM Date: 07/29/17  Intake/Output from previous day: 12/22 0701 - 12/23 0700 In: 2520 [P.O.:120; I.V.:2400] Out: 4280 [Urine:3700; Drains:155; Stool:425] Intake/Output this shift: No intake/output data recorded.  Gen: NAD, comfortable CV: RRR Pulm: Normal work of breathing Abd: Soft, NT, nondistended; stoma pink, some output in appliance; incisions c/d/i without erythema; red rubber in place; JP output serous Ext: SCDs in place  Lab Results: CBC  Recent Labs    07/28/17 0501 07/29/17 0421  WBC 4.2 6.3  HGB 9.9* 10.2*  HCT 30.4* 30.8*  PLT 150 171   BMET Recent Labs    07/28/17 0501 07/29/17 0421  NA 139 139  K 3.7 3.6  CL 106 106  CO2 30 28  GLUCOSE 104* 109*  BUN 10 9  CREATININE 0.71 0.60  CALCIUM 9.9 9.5   PT/INR No results for input(s): LABPROT, INR in the last 72 hours. ABG No results for input(s): PHART, HCO3 in the last 72 hours.  Invalid input(s): PCO2, PO2  Studies/Results:  Anti-infectives: Anti-infectives (From admission, onward)   Start     Dose/Rate Route Frequency Ordered Stop   07/26/17 0700  cefoTEtan in Dextrose 5% (CEFOTAN) 2-2.08 GM-%(50ML) IVPB    Comments:  [place]  : cabinet override      07/26/17 0700 07/26/17 0744   07/26/17 0605  cefoTEtan in Dextrose 5% (CEFOTAN) IVPB 2 g     2 g Intravenous On call to O.R. 07/26/17 0605 07/26/17 0759       Assessment/Plan: Patient Active Problem List   Diagnosis Date Noted  . Rectal cancer ([place]) 07/26/2017  . Genetic testing 05/08/2017  . Family history of breast cancer   . Adenocarcinoma of rectum  ([place]) 04/10/2017   Donna Brandt is a 49yoF with hx of low rectal cancer s/p laparoscopic low anterior resection with double stapled colorectal anastomosis and diverting loop ileostomy 07/26/17  -Recovering well -Regular diet today -Hep lock IVF -Replace potassium and magnesium -Ambulate 5x/day -Monitor stoma output - if >1.2L/day, will plan to replace 1:1 for anything over 1.2L and start metamucil; may additionally need imodium, will keep an eye -Stoma red rubber in loop ileostomy to be removed around discharge -PPx: SQH, SCDs -Dispo: plan discharge tomorrow once she has met with ostomy nurse. Home health consult placed   LOS: 3 days   Clovis Riley MD General, Minimally Invasive and Bariatric Surgery [facility]

## 2017-07-30 LAB — BASIC METABOLIC PANEL
Anion gap: 6 (ref 5–15)
BUN: 7 mg/dL (ref 6–20)
CALCIUM: 9.9 mg/dL (ref 8.9–10.3)
CHLORIDE: 104 mmol/L (ref 101–111)
CO2: 27 mmol/L (ref 22–32)
Creatinine, Ser: 0.68 mg/dL (ref 0.44–1.00)
GFR calc Af Amer: >60 mL/min (ref >60)
Glucose, Bld: 132 mg/dL — ABNORMAL HIGH (ref 65–99)
Potassium: 3.6 mmol/L (ref 3.5–5.1)
SODIUM: 137 mmol/L (ref 135–145)

## 2017-07-30 LAB — CBC WITH DIFFERENTIAL/PLATELET
Basophils Absolute: 0 10*3/uL (ref 0.0–0.1)
Basophils Relative: 0 %
EOS ABS: 0.1 10*3/uL (ref 0.0–0.7)
EOS PCT: 2 %
HCT: 30.6 % — ABNORMAL LOW (ref 36.0–46.0)
Hemoglobin: 10.1 g/dL — ABNORMAL LOW (ref 12.0–15.0)
LYMPHS ABS: 0.5 10*3/uL — AB (ref 0.7–4.0)
Lymphocytes Relative: 10 %
MCH: 26.7 pg (ref 26.0–34.0)
MCHC: 33 g/dL (ref 30.0–36.0)
MCV: 81 fL (ref 78.0–100.0)
MONOS PCT: 9 %
Monocytes Absolute: 0.4 10*3/uL (ref 0.1–1.0)
Neutro Abs: 3.9 10*3/uL (ref 1.7–7.7)
Neutrophils Relative %: 79 %
PLATELETS: 175 10*3/uL (ref 150–400)
RBC: 3.78 MIL/uL — ABNORMAL LOW (ref 3.87–5.11)
RDW: 13.7 % (ref 11.5–15.5)
WBC: 4.9 10*3/uL (ref 4.0–10.5)

## 2017-07-30 LAB — MAGNESIUM: MAGNESIUM: 2.1 mg/dL (ref 1.7–2.4)

## 2017-07-30 LAB — PHOSPHORUS: Phosphorus: 3.3 mg/dL (ref 2.5–4.6)

## 2017-07-30 NOTE — Progress Notes (Signed)
Subjective Doing well - this morning has had some nausea and bloating - has not attempted to eat/drink yet today. Pain control adequate. Saw stoma RN last night for teaching. Red rubber removed from loop ileostomy.  Objective: Vital signs in last 24 hours: Temp:  [98.5 F (36.9 C)-100 F (37.8 C)] 99.1 F (37.3 C) (12/24 5859) Pulse Rate:  [72-81] 72 (12/24 0608) Resp:  [16-18] 18 (12/24 2924) BP: (108-118)/(66-75) 108/66 (12/24 4628) SpO2:  [99 %-100 %] 100 % (12/24 6381) Last BM Date: 07/29/17  Intake/Output from previous day: 12/23 0701 - 12/24 0700 In: 240 [P.O.:240] Out: 3040 [Urine:2200; Drains:90; Stool:750] Intake/Output this shift: Total I/O In: 240 [P.O.:240] Out: 410 [Urine:300; Stool:110]  Gen: NAD, comfortable CV: RRR Pulm: Normal work of breathing Abd: Soft, NT, nondistended; stoma pink, some output in appliance; incisions c/d/i without erythema; JP output serous - removed Ext: SCDs in place  Lab Results: CBC  Recent Labs    07/29/17 0421 07/30/17 0410  WBC 6.3 4.9  HGB 10.2* 10.1*  HCT 30.8* 30.6*  PLT 171 175   BMET Recent Labs    07/29/17 0421 07/30/17 0410  NA 139 137  K 3.6 3.6  CL 106 104  CO2 28 27  GLUCOSE 109* 132*  BUN 9 7  CREATININE 0.60 0.68  CALCIUM 9.5 9.9   PT/INR No results for input(s): LABPROT, INR in the last 72 hours. ABG No results for input(s): PHART, HCO3 in the last 72 hours.  Invalid input(s): PCO2, PO2  Studies/Results:  Anti-infectives: Anti-infectives (From admission, onward)   Start     Dose/Rate Route Frequency Ordered Stop   07/26/17 0700  cefoTEtan in Dextrose 5% (CEFOTAN) 2-2.08 GM-%(50ML) IVPB    Comments:  [place]  : cabinet override      07/26/17 0700 07/26/17 0744   07/26/17 0605  cefoTEtan in Dextrose 5% (CEFOTAN) IVPB 2 g     2 g Intravenous On call to O.R. 07/26/17 0605 07/26/17 0759       Assessment/Plan: Patient Active Problem List   Diagnosis Date Noted  . Rectal cancer  ([place]) 07/26/2017  . Genetic testing 05/08/2017  . Family history of breast cancer   . 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 Zimmerle is a 79yoF with hx of low rectal cancer s/p laparoscopic low anterior resection with double stapled colorectal anastomosis and diverting loop ileostomy 07/26/17  -Recovering well; JP drain removed -Diet as tolerated - avoid raw/uncooked vegetables/ruffage. Will monitor PO today - if tolerating and no n/v will plan discharge this afternoon -Ambulate 5x/day -Monitor stoma output - if >1.2L/day, will plan to replace 1:1 for anything over 1.2L and start metamucil; may additionally need imodium, will keep an eye -PPx: SQH, SCDs -Dispo: plan discharge later today if tolerates diet - no n/v.   LOS: 4 days   [facility]

## 2017-07-30 NOTE — Progress Notes (Signed)
Discharge planning, spoke with patient and spouse at beside. Chose [facility] services, contacted [facility] for referral. Concerned about ostomy appliance, not sure if she applied the wafer correctly. She changed this am herself and is independent with emptying. Will have nurse check again for correct application. [phone]

## 2017-07-30 NOTE — Progress Notes (Signed)
Pt is alert, oriented.  Had some nausea after eating, instructed her about eating lightly at first.  D/C instructions and prescription was given.  All questions answered. Pt was d/cd home with spouse.

## 2017-07-30 NOTE — Consult Note (Signed)
[place] nurse in to assess pouch. Patient changed during the night or this am. The previous pouch had a pinhole area at the spout. The patient was concerned about the placement of the barrier ring.  Pouch is intact and the barrier ring is placed appropriately.  Reassured patient about supplies and HHRN. Reviewed steps of pouch change and use of barrier ring with patient and her spouse.    Discussed diet and dehydration risk. She seems to be reassured. She had forgotten to close the pouch earlier so we discussed that she can most definitely go through her pouches and close them ahead of time which made her feel much better.  [place], [place], [place]

## 2017-07-30 NOTE — Progress Notes (Signed)
Pt said she was ready to go home so the MD was called and gave order for discharge.

## 2017-08-01 NOTE — Discharge Summary (Signed)
Patient ID: Donna Brandt MRN: [id number] DOB/AGE: [birth year] 42 y.o.  Admit date: 07/26/2017 Discharge date: 08/01/2017  Discharge Diagnoses Patient Active Problem List   Diagnosis Date Noted  . Rectal cancer ([place]) 07/26/2017  . Genetic testing 05/08/2017  . Family history of breast cancer   . 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 Postoperatively, she did well. Her stoma began to be productive the following day. Her diet was steadily advanced. Stoma care and teaching was completed. On 07/30/17, her stoma output was manageable without antidiarrheals, she was tolerating a diet and pain was controlled on oral analgesics. She was deemed stable for discharge home.    Allergies as of 07/30/2017      Reactions   Dilaudid [hydromorphone Hcl] Itching   Latex Rash, Other (See Comments)   Skin sensitivity       Medication List    TAKE these medications   amLODipine 10 MG tablet Commonly known as:  NORVASC Take 10 mg by mouth daily.   hydrocortisone 25 MG suppository Commonly known as:  ANUSOL-HC Place 25 mg rectally 2 (two) times daily as needed for hemorrhoids.   ibuprofen 600 MG tablet Commonly known as:  ADVIL,MOTRIN Take 1 tablet (600 mg total) by mouth 3 (three) times daily. Take with food What changed:    when to take this  reasons to take this  additional instructions   oxybutynin 5 MG 24 hr tablet Commonly known as:  DITROPAN XL Take 1 tablet (5 mg total) by mouth at bedtime. What changed:    when to take this  reasons to take this   oxyCODONE-acetaminophen 10-325 MG tablet Commonly known as:  PERCOCET Take 1 tablet by mouth every 6 (six) hours as needed for up to 5 days for pain.   phenazopyridine 100 MG tablet Commonly known as:  PYRIDIUM Take  1 tablet (100 mg total) by mouth 3 (three) times daily as needed for pain.   PROBIOTIC-10 Caps Take 1 capsule by mouth daily.   sertraline 100 MG tablet Commonly known as:  ZOLOFT Take 100 mg by mouth daily.   SUMAtriptan 100 MG tablet Commonly known as:  IMITREX Take 100 mg by mouth every 2 (two) hours as needed for migraine. May repeat once in 2 hours if needed   valACYclovir 500 MG tablet Commonly known as:  VALTREX Take 500 mg by mouth daily as needed (for break out).        Follow-up Information    Ileana Roup, MD Follow up in 2 week(s).   Specialty:  General Surgery Contact information: [place] [address] [phone]        [facility] Follow up.   Specialty:  [place] Why:  nurse to assist with ostomy care Contact information: [address] [place] [address] [phone]           Danniel Tones M. Dema Severin, M.D. [facility]

## 2017-08-06 ENCOUNTER — Other Ambulatory Visit: Payer: Self-pay | Admitting: Oncology

## 2017-08-06 DIAGNOSIS — C20 Malignant neoplasm of rectum: Secondary | ICD-10-CM

## 2017-08-08 ENCOUNTER — Encounter: Payer: Self-pay | Admitting: *Deleted

## 2017-08-10 ENCOUNTER — Ambulatory Visit (HOSPITAL_BASED_OUTPATIENT_CLINIC_OR_DEPARTMENT_OTHER): Payer: BLUE CROSS/BLUE SHIELD | Admitting: Oncology

## 2017-08-10 ENCOUNTER — Telehealth: Payer: Self-pay | Admitting: Oncology

## 2017-08-10 ENCOUNTER — Encounter: Payer: Self-pay | Admitting: Oncology

## 2017-08-10 VITALS — BP 122/89 | HR 86 | Temp 98.8°F | Resp 18 | Ht 67.0 in | Wt 183.2 lb

## 2017-08-10 DIAGNOSIS — L819 Disorder of pigmentation, unspecified: Secondary | ICD-10-CM | POA: Diagnosis not present

## 2017-08-10 DIAGNOSIS — C20 Malignant neoplasm of rectum: Secondary | ICD-10-CM

## 2017-08-10 DIAGNOSIS — Z923 Personal history of irradiation: Secondary | ICD-10-CM

## 2017-08-10 NOTE — Progress Notes (Signed)
[facility] OFFICE PROGRESS NOTE   Diagnosis: Rectal cancer  INTERVAL HISTORY:   Ms. Santalucia returns as scheduled.  She underwent a laparoscopic low anterior resection and diverting ileostomy by Dr. Dema Severin on 07/26/2017.  A small mass was noted in the anterolateral distal rectum at 2 cm from the anal verge.  The pathology ([id number]) revealed invasive adenocarcinoma extending into the muscularis propria.  The resection margins were negative.  21 lymph nodes were negative.  No macroscopic tumor perforation.  No lymphovascular or perineural invasion.  A neoadjuvant treatment effect was noted.  Mismatch repair protein expression was normal.  She continues to have dryness and skin thickening at the feet.  She reports numbness at the soles.  The hand skin changes have resolved.  She empties the ileostomy approximately 7 times per day.  She began Imodium and Metamucil yesterday.  Objective:  Vital signs in last 24 hours:  Blood pressure 122/89, pulse 86, temperature 98.8 F (37.1 C), temperature source Oral, resp. rate 18, height 5' 7" (1.702 m), weight 183 lb 3.2 oz (83.1 kg), SpO2 100 %.   Resp: Lungs clear bilaterally Cardio: Regular rate and rhythm GI: No hepatomegaly, right lower quadrant ileostomy, no mass, healed surgical incisions Vascular: No leg edema  Skin: Palms without erythema or skin breakdown.  Dryness with skin thickening at the soles.  No ulcerations.    Lab Results:  Lab Results  Component Value Date   WBC 4.9 07/30/2017   HGB 10.1 (L) 07/30/2017   HCT 30.6 (L) 07/30/2017   MCV 81.0 07/30/2017   PLT 175 07/30/2017   NEUTROABS 3.9 07/30/2017    CMP     Component Value Date/Time   NA 137 07/30/2017 0410   NA 143 07/04/2017 0813   K 3.6 07/30/2017 0410   K 4.0 07/04/2017 0813   CL 104 07/30/2017 0410   CO2 27 07/30/2017 0410   CO2 25 07/04/2017 0813   GLUCOSE 132 (H) 07/30/2017 0410   GLUCOSE 106 07/04/2017 0813   BUN 7 07/30/2017 0410    BUN 15.0 07/04/2017 0813   CREATININE 0.68 07/30/2017 0410   CREATININE 0.8 07/04/2017 0813   CALCIUM 9.9 07/30/2017 0410   CALCIUM 10.4 07/04/2017 0813   PROT 7.3 07/23/2017 0958   PROT 7.6 07/04/2017 0813   ALBUMIN 3.9 07/23/2017 0958   ALBUMIN 4.0 07/04/2017 0813   AST 25 07/23/2017 0958   AST 18 07/04/2017 0813   ALT 31 07/23/2017 0958   ALT 25 07/04/2017 0813   ALKPHOS 89 07/23/2017 0958   ALKPHOS 90 07/04/2017 0813   BILITOT 0.9 07/23/2017 0958   BILITOT 0.40 07/04/2017 0813   GFRNONAA >60 07/30/2017 0410   GFRAA >60 07/30/2017 0410    Lab Results  Component Value Date   CEA1 55.78 (H) 04/19/2017     Medications: I have reviewed the patient's current medications.   Assessment/Plan: 1. Rectal cancer, clinical stage T3b,N0,M0  colonoscopy 03/06/2017-biopsy of a rectal mass revealed fragments of an adenomatous lesion with high-grade dysplasia, definite submucosa not available to evaluate for invasion  Proctoscopy/biopsy 03/23/2017 confirmed a mass at 6-7 centimeters from the anal verge, biopsy revealed polypoid colorectal mucosa with detached fragments of adenomatous mucosa  Staging CTs 03/16/2017, anterior rectal mass, no evidence of metastatic disease, no adenopathy,  MRI 03/21/2017-T3b,N0rectal tumor  Initiationof radiation and concurrent Xeloda 04/23/2017.Completed 05/30/2017.  Laparoscopic low anterior resection and diverting ileostomy 07/26/2017,ypT2,ypN0 tumor with negative surgical margins, normal mismatch repair protein expression  2. Hypertension  3.  Depression  4.   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 Albee has recovered from the low anterior resection/ileostomy procedure.  She will begin Imodium/Metamucil in an attempt to decrease the ileostomy output.  I reviewed the surgical pathology report with her today.  She has been diagnosed with pathologic stage I adenocarcinoma of the rectum.  She had clinical stage  II disease prior to initiation of neoadjuvant therapy.  I recommend completing a course of adjuvant Xeloda.  We reviewed the potential toxicities associated with Xeloda including the chance of diarrhea and worsening of the hand/foot syndrome.  The skin changes at the feet appear to be resolving.  She will return for an office visit and baseline laboratory studies prior to beginning adjuvant Xeloda 08/20/2017.  25 minutes were spent with the patient today.  The majority of the time was used for counseling and coordination of care.  Betsy Coder, MD  08/10/2017  6:24 PM

## 2017-08-10 NOTE — Telephone Encounter (Signed)
Gave avs and calendar for january °

## 2017-08-14 ENCOUNTER — Telehealth: Payer: Self-pay

## 2017-08-14 ENCOUNTER — Telehealth: Payer: Self-pay | Admitting: Pharmacist

## 2017-08-14 DIAGNOSIS — C20 Malignant neoplasm of rectum: Secondary | ICD-10-CM

## 2017-08-14 NOTE — Telephone Encounter (Signed)
Oral Oncology Pharmacist Encounter  Received new prescription for adjuvant capecitaine for the treatment of rectal caner planned duration 3 months. She previously received ~5.5 weeks of capecitabine + radiation neoadjuvant.   Counseled patient on administration, dosing, side effects, monitoring, drug-food interactions, safe handling, storage, and disposal.  Patient will take capcitaine 1500 PO BID for 14 days on, seven days off within 30 minutes of a meal. Noted lower dose in setting of previous hand and foot syndrome that has not completely resolved yet.  start date: 08/20/17   Side effects include but not limited to: nausea, vomiting, fatigue, hand-foot syndrome, decreased blood counts, increased sun sensitivity.    Labs from 07/30/17 assessed, okay to start treatment.  Current medication list in Epic reviewed, no relevant DDIs with capecitabine identified.   Script was sent to [facility] for processing and filling.   Demetrius Charity, PharmD PGY2 Oncology Pharmacy Resident  Pharmacy Phone: [phone] 08/14/2017

## 2017-08-14 NOTE — Telephone Encounter (Signed)
Spoke with Donna Brandt to order MSI testing on Accession#: [id number].

## 2017-08-15 MED ORDER — CAPECITABINE 500 MG PO TABS: 1500.0000 mg | ORAL_TABLET | Freq: Two times a day (BID) | ORAL | 0 refills | Status: DC

## 2017-08-17 ENCOUNTER — Other Ambulatory Visit: Payer: Self-pay | Admitting: Pharmacist

## 2017-08-20 ENCOUNTER — Inpatient Hospital Stay: Payer: BLUE CROSS/BLUE SHIELD | Attending: Oncology | Admitting: Oncology

## 2017-08-20 ENCOUNTER — Inpatient Hospital Stay: Payer: BLUE CROSS/BLUE SHIELD

## 2017-08-20 ENCOUNTER — Telehealth: Payer: Self-pay | Admitting: Oncology

## 2017-08-20 VITALS — BP 121/84 | HR 80 | Temp 98.1°F | Resp 17 | Wt 182.7 lb

## 2017-08-20 DIAGNOSIS — R531 Weakness: Secondary | ICD-10-CM | POA: Diagnosis not present

## 2017-08-20 DIAGNOSIS — L271 Localized skin eruption due to drugs and medicaments taken internally: Secondary | ICD-10-CM | POA: Diagnosis not present

## 2017-08-20 DIAGNOSIS — R200 Anesthesia of skin: Secondary | ICD-10-CM | POA: Diagnosis present

## 2017-08-20 DIAGNOSIS — Z923 Personal history of irradiation: Secondary | ICD-10-CM | POA: Diagnosis not present

## 2017-08-20 DIAGNOSIS — C20 Malignant neoplasm of rectum: Secondary | ICD-10-CM | POA: Diagnosis present

## 2017-08-20 DIAGNOSIS — F329 Major depressive disorder, single episode, unspecified: Secondary | ICD-10-CM | POA: Insufficient documentation

## 2017-08-20 DIAGNOSIS — G620 Drug-induced polyneuropathy: Secondary | ICD-10-CM | POA: Insufficient documentation

## 2017-08-20 DIAGNOSIS — I10 Essential (primary) hypertension: Secondary | ICD-10-CM | POA: Insufficient documentation

## 2017-08-20 LAB — CBC WITH DIFFERENTIAL/PLATELET
Basophils Absolute: 0 10*3/uL (ref 0.0–0.1)
Basophils Relative: 1 %
EOS ABS: 0.3 10*3/uL (ref 0.0–0.5)
EOS PCT: 11 %
HCT: 35.7 % (ref 34.8–46.6)
Hemoglobin: 11.7 g/dL (ref 11.6–15.9)
LYMPHS ABS: 0.5 10*3/uL — AB (ref 0.9–3.3)
LYMPHS PCT: 18 %
MCH: 26.4 pg (ref 25.1–34.0)
MCHC: 32.9 g/dL (ref 31.5–36.0)
MCV: 80.3 fL (ref 79.5–101.0)
MONO ABS: 0.3 10*3/uL (ref 0.1–0.9)
Monocytes Relative: 10 %
Neutro Abs: 1.7 10*3/uL (ref 1.5–6.5)
Neutrophils Relative %: 60 %
PLATELETS: 218 10*3/uL (ref 145–400)
RBC: 4.45 MIL/uL (ref 3.70–5.45)
RDW: 13.6 % (ref 11.2–16.1)
WBC: 2.8 10*3/uL — AB (ref 3.9–10.3)

## 2017-08-20 LAB — CEA (IN HOUSE-CHCC)

## 2017-08-20 NOTE — Progress Notes (Signed)
  [facility] OFFICE PROGRESS NOTE   Diagnosis: Rectal cancer  INTERVAL HISTORY:   Ms. Grimley returns as scheduled.  She began Xeloda this morning.  The ileostomy output is approximately 900 cc daily.  She continues to have "numbness "in the feet.  The hands feel normal.  Objective:  Vital signs in last 24 hours:  There were no vitals taken for this visit.    Resp: Lungs clear bilaterally Cardio: Regular rate and rhythm GI: Nontender, right mid abdomen ileostomy with liquid stool Vascular: No leg edema  Skin: Skin thickening with callus formation at the soles.  No skin breakdown or erythema.  Minimal hyperpigmentation Neurologic: Sensation is intact to light touch at the soles  Portacath/PICC-without erythema  Lab Results:  Lab Results  Component Value Date   WBC 2.8 (L) 08/20/2017   HGB 11.7 08/20/2017   HCT 35.7 08/20/2017   MCV 80.3 08/20/2017   PLT 218 08/20/2017   NEUTROABS 1.7 08/20/2017    CMP     Component Value Date/Time   NA 137 07/30/2017 0410   NA 143 07/04/2017 0813   K 3.6 07/30/2017 0410   K 4.0 07/04/2017 0813   CL 104 07/30/2017 0410   CO2 27 07/30/2017 0410   CO2 25 07/04/2017 0813   GLUCOSE 132 (H) 07/30/2017 0410   GLUCOSE 106 07/04/2017 0813   BUN 7 07/30/2017 0410   BUN 15.0 07/04/2017 0813   CREATININE 0.68 07/30/2017 0410   CREATININE 0.8 07/04/2017 0813   CALCIUM 9.9 07/30/2017 0410   CALCIUM 10.4 07/04/2017 0813   PROT 7.3 07/23/2017 0958   PROT 7.6 07/04/2017 0813   ALBUMIN 3.9 07/23/2017 0958   ALBUMIN 4.0 07/04/2017 0813   AST 25 07/23/2017 0958   AST 18 07/04/2017 0813   ALT 31 07/23/2017 0958   ALT 25 07/04/2017 0813   ALKPHOS 89 07/23/2017 0958   ALKPHOS 90 07/04/2017 0813   BILITOT 0.9 07/23/2017 0958   BILITOT 0.40 07/04/2017 0813   GFRNONAA >60 07/30/2017 0410   GFRAA >60 07/30/2017 0410    Lab Results  Component Value Date   CEA1 55.78 (H) 04/19/2017     Medications: I have reviewed the  patient's current medications.   Assessment/Plan: 1. Rectal cancer, clinical stage T3b,N0,M0  colonoscopy 03/06/2017-biopsy of a rectal mass revealed fragments of an adenomatous lesion with high-grade dysplasia, definite submucosa not available to evaluate for invasion  Proctoscopy/biopsy 03/23/2017 confirmed a mass at 6-7 centimeters from the anal verge, biopsy revealed polypoid colorectal mucosa with detached fragments of adenomatous mucosa  Staging CTs 03/16/2017, anterior rectal mass, no evidence of metastatic disease, no adenopathy,  MRI 03/21/2017-T3b,N0rectal tumor  Initiationof radiation and concurrent Xeloda 04/23/2017.Completed 05/30/2017.  Laparoscopic low anterior resection and diverting ileostomy 07/26/2017,ypT2,ypN0 tumor with negative surgical margins, normal mismatch repair protein expression  Cycle 1 adjuvant Xeloda 08/20/2017  2. Hypertension  3. Depression  4.   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 Romaniello has recovered from the low anterior resection.  She appears to be tolerating the ileostomy well.  She will begin adjuvant Xeloda today.  She will discontinue Xeloda and contact us if she develops hand/foot pain or increased output from the ileostomy.  She will return for an office and lab visit 09/06/2017.  25 minutes were spent with the patient today.  The majority of the time was used for counseling and coordination of care.  Betsy Coder, MD  08/20/2017  9:39 AM

## 2017-08-20 NOTE — Telephone Encounter (Signed)
Gave avs and calendar for January  °

## 2017-08-21 ENCOUNTER — Telehealth: Payer: Self-pay | Admitting: *Deleted

## 2017-08-21 NOTE — Telephone Encounter (Signed)
Received FMLA form via fax from [employer]. Called pt, she is currently on continuous leave as she recovers from surgery. Her LOA for surgery will end 09/07/17.  Pt is unsure whether she will need continuous or intermittent leave thereafter. She continues to have numbness and tingling in her feet. She reports she will need a letter stating she requires a more comfortable/ sturdy shoe when she returns to work (dress code calls for shoes without laces.) Letter will also need to specify that she needs to be allowed to drink fluids throughout the day due to her ileostomy. Informed pt we will likely hold on to the FMLA request until next visit to have a better picture of how she is tolerating treatment.

## 2017-08-22 ENCOUNTER — Telehealth: Payer: Self-pay

## 2017-08-22 NOTE — Telephone Encounter (Signed)
Nutrition  Patient identified on Malnutrition screening report for poor appetite and weight loss.   Patient with adenocarcinoma of the rectum followed by Dr. Benay Spice.  Patient s/p lower anterior resection and diverting ileostomy on 07/26/2017.  Started xeloda on 08/20/17.    Spoke with patient via phone and offered nutrition service.  Patient reports weight decreased due to surgery and being on liquids for awhile. Reports that she is trying to eat well but feels when she goes back to work she will not be able to eat small frequent meals during the day with work schedule.  Reports ileostomy output is around 962ml daily.  Also concerned about having to change bag frequently and going back to work.    Weight 192 lb 12/20 decreased to 182 lb on 1/14.    Offered nutrition visit and patient will call if wants to set up appointment. Offered to mail Ileostomy Nutrition Therapy from AND handout and patient receptive to information.   Patient interested in ostomy support group, currently attending GI Cancer Support Group.  Spoke with Education officer, museum, Electrical engineer with call patient to discuss.  Contact information mailed  Donna Brandt, [place], [place] Registered Dietitian [phone] (pager)

## 2017-08-23 ENCOUNTER — Telehealth: Payer: Self-pay | Admitting: Oncology

## 2017-08-23 NOTE — Telephone Encounter (Signed)
FAXED RECORDS TO [employer]

## 2017-08-28 ENCOUNTER — Telehealth: Payer: Self-pay | Admitting: Emergency Medicine

## 2017-08-28 ENCOUNTER — Telehealth: Payer: Self-pay | Admitting: *Deleted

## 2017-08-28 ENCOUNTER — Other Ambulatory Visit: Payer: Self-pay | Admitting: Oncology

## 2017-08-28 DIAGNOSIS — C20 Malignant neoplasm of rectum: Secondary | ICD-10-CM

## 2017-08-28 NOTE — Telephone Encounter (Signed)
Received call from Forestine Chute, RN @ [facility] requesting an extension of home health visits for pt.  Per Lesleigh Noe, pt needs nurse visit 1 x per week for additional 3 more weeks to help with colostomy management. [facility]    Phone     [phone].

## 2017-08-28 NOTE — Telephone Encounter (Signed)
Pt called c/o increased numbness from her legs down to her feet. Dr.Sherrill made aware and instructed patient to stop taking xelodo at this time and to come in and see Sandi Mealy this week for S/M. Pt verbalized understanding of this. Scheduling message sent.

## 2017-08-28 NOTE — Telephone Encounter (Signed)
Called [facility] and left message on voice mail of Dr. Gearldine Shown instructions below.

## 2017-08-28 NOTE — Telephone Encounter (Signed)
Request should go to her surgeon

## 2017-08-29 ENCOUNTER — Inpatient Hospital Stay (HOSPITAL_BASED_OUTPATIENT_CLINIC_OR_DEPARTMENT_OTHER): Payer: BLUE CROSS/BLUE SHIELD | Admitting: Medical

## 2017-08-29 ENCOUNTER — Telehealth: Payer: Self-pay | Admitting: Oncology

## 2017-08-29 VITALS — BP 121/82 | HR 88 | Temp 97.3°F | Resp 16 | Wt 182.7 lb

## 2017-08-29 DIAGNOSIS — G629 Polyneuropathy, unspecified: Secondary | ICD-10-CM

## 2017-08-29 DIAGNOSIS — R208 Other disturbances of skin sensation: Secondary | ICD-10-CM

## 2017-08-29 DIAGNOSIS — C20 Malignant neoplasm of rectum: Secondary | ICD-10-CM | POA: Diagnosis not present

## 2017-08-29 NOTE — Progress Notes (Signed)
Symptoms Management Clinic Progress Note   Donna Brandt [id number] 25-Dec-1975 42 y.o.  Donna Brandt is managed by Dr. Ladell Pier  Actively treated with chemotherapy: yes  Current Therapy:  Xeloda  Last Treated: 08/28/2017  Assessment: Plan:    Neuropathy - Plan: MR Lumbar Spine Wo Contrast  Adenocarcinoma of rectum ([place]) - Plan: MR Lumbar Spine Wo Contrast   Neuropathy of the bilateral lower extremities: The patient has been referred for an MRI of the lumbar spine.  Adenocarcinoma of the rectum: The patient continues to be followed by Dr. Benay Spice and has most recently been treated with Xeloda which is being held until her follow-up appointment on 09/06/2017.  Please see After Visit Summary for patient specific instructions.  Future Appointments  Date Time Provider [place]  09/06/2017  2:45 PM [facility] 1 [facility] None  09/06/2017  3:15 PM Owens Shark, NP [facility] None    Orders Placed This Encounter  Procedures  . MR Lumbar Spine Wo Contrast       Subjective:   Patient ID:  Donna Brandt is a 42 y.o. (DOB 03-Dec-1975) female.  Chief Complaint:  Chief Complaint  Patient presents with  . Numbness    both legs    HPI Donna Brandt is a 42 year old female with rectal cancer (T3b,N0,M0) who was treated with radiation and concurrent Xeloda from 04/23/2017 to 05/30/2017. She is status post a laparoscopic low anterior resection and diverting ileostomy on 07/26/2017 with pathology returning showing a ypT2,ypN0 tumor with negative surgical margins. She is now treated with adjuvant Xeloda since 08/20/2017. Donna Brandt contacted the office yesterday reporting increased numbness from her legs down to her feet. Dr.Sherrill was made aware. He instructed the patient to stop taking xelodo and to come in to be seen.  Donna Brandt also reports that she is having weakness in her lower extremities.  She is unsure if it is true weakness or simply  related to her profound neuropathy.  Medications: I have reviewed the patient's current medications.  Allergies:  Allergies  Allergen Reactions  . Dilaudid [Hydromorphone Hcl] Itching  . Latex Rash and Other (See Comments)    Skin sensitivity     Past Medical History:  Diagnosis Date  . Colon cancer ([place])   . Complication of anesthesia    during colonoscopy and wisdom teeth extraction  . Depression   . Family history of breast cancer   . HTN (hypertension)   . Migraine     Past Surgical History:  Procedure Laterality Date  . ABDOMINAL HYSTERECTOMY     due to uterine fibroids  . AUGMENTATION MAMMAPLASTY Bilateral   . FLEXIBLE SIGMOIDOSCOPY N/A 04/12/2017   Procedure: FLEXIBLE SIGMOIDOSCOPY;  Surgeon: Milus Banister, MD;  Location: [facility];  Service: Endoscopy;  Laterality: N/A;  . FLEXIBLE SIGMOIDOSCOPY N/A 07/18/2017   Procedure: FLEXIBLE SIGMOIDOSCOPY EXAM UNDER ANESTHESIA;  Surgeon: Ileana Roup, MD;  Location: [facility];  Service: General;  Laterality: N/A;  . FLEXIBLE SIGMOIDOSCOPY  07/26/2017   Procedure: FLEXIBLE SIGMOIDOSCOPY;  Surgeon: Ileana Roup, MD;  Location: [facility];  Service: General;;  . LAPAROSCOPIC LOW ANTERIOR RESECTION N/A 07/26/2017   Procedure: LAPAROSCOPIC VS OPEN  LOW ANTERIOR RESECTION WITH [place] LOOP ILEOSTOMY;  Surgeon: Ileana Roup, MD;  Location: [facility];  Service: General;  Laterality: N/A;  . PROCTOSCOPY  07/26/2017   Procedure: PROCTOSCOPY;  Surgeon: Ileana Roup, MD;  Location: [facility];  Service: General;;  . TONSILLECTOMY    .  TUBAL LIGATION    . WISDOM TOOTH EXTRACTION      Family History  Problem Relation Age of Onset  . Breast cancer Other 9    Social History   Socioeconomic History  . Marital status: Single    Spouse name: Not on file  . Number of children: Not on file  . Years of education: Not on file  . Highest education level: Not on file  Social Needs  . Financial resource  strain: Not on file  . Food insecurity - worry: Not on file  . Food insecurity - inability: Not on file  . Transportation needs - medical: Not on file  . Transportation needs - non-medical: Not on file  Occupational History  . Not on file  Tobacco Use  . Smoking status: Never Smoker  . Smokeless tobacco: Never Used  Substance and Sexual Activity  . Alcohol use: Yes    Comment: in social settings  . Drug use: No  . Sexual activity: Yes  Other Topics Concern  . Not on file  Social History Narrative  . Not on file    Past Medical History, Surgical history, Social history, and Family history were reviewed and updated as appropriate.   Please see review of systems for further details on the patient's review from today.   Review of Systems:  Review of Systems  Constitutional: Negative for activity change, chills, diaphoresis and fever.  Respiratory: Negative for cough, chest tightness and shortness of breath.   Cardiovascular: Negative for chest pain, palpitations and leg swelling.  Musculoskeletal: Negative for back pain and gait problem.  Neurological: Positive for weakness and numbness.    Objective:   Physical Exam:  BP 121/82 (BP Location: Right Arm, Patient Position: Sitting)   Pulse 88   Temp (!) 97.3 F (36.3 C) (Oral)   Resp 16   Wt 182 lb 11.2 oz (82.9 kg)   SpO2 98%   BMI 28.61 kg/m  ECOG: 1  Physical Exam  Constitutional: No distress.  HENT:  Head: Normocephalic and atraumatic.  Cardiovascular: Normal rate, regular rhythm and normal heart sounds. Exam reveals no gallop and no friction rub.  No murmur heard. Pulmonary/Chest: Effort normal and breath sounds normal. No respiratory distress. She has no wheezes. She has no rales.  Musculoskeletal: Normal range of motion. She exhibits no edema, tenderness or deformity.  Neurological: She is alert. She displays no atrophy and no tremor. She exhibits normal muscle tone. Coordination normal.  Reflex Scores:       Patellar reflexes are 0 on the right side and 0 on the left side.      Achilles reflexes are 2+ on the right side and 2+ on the left side. Lower extremity strength bilaterally: Quadriceps: 4/5 Lower extremity flexion: 3/5 Lower extremity extension: 3/5 Dorsiflexion: 3/5 Plantar flexion: 4/5   Skin: She is not diaphoretic.    Lab Review:     Component Value Date/Time   NA 137 07/30/2017 0410   NA 143 07/04/2017 0813   K 3.6 07/30/2017 0410   K 4.0 07/04/2017 0813   CL 104 07/30/2017 0410   CO2 27 07/30/2017 0410   CO2 25 07/04/2017 0813   GLUCOSE 132 (H) 07/30/2017 0410   GLUCOSE 106 07/04/2017 0813   BUN 7 07/30/2017 0410   BUN 15.0 07/04/2017 0813   CREATININE 0.68 07/30/2017 0410   CREATININE 0.8 07/04/2017 0813   CALCIUM 9.9 07/30/2017 0410   CALCIUM 10.4 07/04/2017 0813   PROT 7.3 07/23/2017  0958   PROT 7.6 07/04/2017 0813   ALBUMIN 3.9 07/23/2017 0958   ALBUMIN 4.0 07/04/2017 0813   AST 25 07/23/2017 0958   AST 18 07/04/2017 0813   ALT 31 07/23/2017 0958   ALT 25 07/04/2017 0813   ALKPHOS 89 07/23/2017 0958   ALKPHOS 90 07/04/2017 0813   BILITOT 0.9 07/23/2017 0958   BILITOT 0.40 07/04/2017 0813   GFRNONAA >60 07/30/2017 0410   GFRAA >60 07/30/2017 0410       Component Value Date/Time   WBC 2.8 (L) 08/20/2017 0835   RBC 4.45 08/20/2017 0835   HGB 11.7 08/20/2017 0835   HGB 11.8 07/04/2017 0813   HCT 35.7 08/20/2017 0835   HCT 35.4 07/04/2017 0813   PLT 218 08/20/2017 0835   PLT 180 07/04/2017 0813   MCV 80.3 08/20/2017 0835   MCV 81.8 07/04/2017 0813   MCH 26.4 08/20/2017 0835   MCHC 32.9 08/20/2017 0835   RDW 13.6 08/20/2017 0835   RDW 14.7 (H) 07/04/2017 0813   LYMPHSABS 0.5 (L) 08/20/2017 0835   LYMPHSABS 0.5 (L) 07/04/2017 0813   MONOABS 0.3 08/20/2017 0835   MONOABS 0.3 07/04/2017 0813   EOSABS 0.3 08/20/2017 0835   EOSABS 0.0 07/04/2017 0813   BASOSABS 0.0 08/20/2017 0835   BASOSABS 0.0 07/04/2017 0813    -------------------------------  Imaging from last 24 hours (if applicable):  Radiology interpretation: No results found.      This case was discussed with Dr. Benay Spice. He expressed agreement with my management of this patient.

## 2017-08-29 NOTE — Telephone Encounter (Signed)
Called patient to schedule with smc - spoke with patient regarding appts that was added per 1/22 sch msg

## 2017-09-05 ENCOUNTER — Ambulatory Visit (HOSPITAL_COMMUNITY)
Admission: RE | Admit: 2017-09-05 | Discharge: 2017-09-05 | Disposition: A | Discharge status: Home / Self Care | Payer: BLUE CROSS/BLUE SHIELD | Source: Ambulatory Visit | Attending: Medical | Admitting: Medical

## 2017-09-05 DIAGNOSIS — C20 Malignant neoplasm of rectum: Secondary | ICD-10-CM | POA: Insufficient documentation

## 2017-09-05 DIAGNOSIS — G629 Polyneuropathy, unspecified: Secondary | ICD-10-CM | POA: Diagnosis not present

## 2017-09-05 DIAGNOSIS — Z923 Personal history of irradiation: Secondary | ICD-10-CM | POA: Diagnosis not present

## 2017-09-06 ENCOUNTER — Inpatient Hospital Stay (HOSPITAL_BASED_OUTPATIENT_CLINIC_OR_DEPARTMENT_OTHER): Payer: BLUE CROSS/BLUE SHIELD | Admitting: Nurse Practitioner

## 2017-09-06 ENCOUNTER — Inpatient Hospital Stay: Payer: BLUE CROSS/BLUE SHIELD

## 2017-09-06 ENCOUNTER — Encounter: Payer: Self-pay | Admitting: Nurse Practitioner

## 2017-09-06 VITALS — BP 117/82 | HR 82 | Temp 98.6°F | Resp 18 | Ht 67.0 in | Wt 182.0 lb

## 2017-09-06 DIAGNOSIS — Z923 Personal history of irradiation: Secondary | ICD-10-CM

## 2017-09-06 DIAGNOSIS — C20 Malignant neoplasm of rectum: Secondary | ICD-10-CM | POA: Diagnosis not present

## 2017-09-06 DIAGNOSIS — G620 Drug-induced polyneuropathy: Secondary | ICD-10-CM | POA: Diagnosis not present

## 2017-09-06 DIAGNOSIS — L271 Localized skin eruption due to drugs and medicaments taken internally: Secondary | ICD-10-CM | POA: Diagnosis not present

## 2017-09-06 DIAGNOSIS — R531 Weakness: Secondary | ICD-10-CM | POA: Diagnosis not present

## 2017-09-06 DIAGNOSIS — F329 Major depressive disorder, single episode, unspecified: Secondary | ICD-10-CM

## 2017-09-06 DIAGNOSIS — I10 Essential (primary) hypertension: Secondary | ICD-10-CM | POA: Diagnosis not present

## 2017-09-06 DIAGNOSIS — R200 Anesthesia of skin: Secondary | ICD-10-CM | POA: Diagnosis not present

## 2017-09-06 LAB — CBC WITH DIFFERENTIAL (CANCER CENTER ONLY)
Basophils Absolute: 0 10*3/uL (ref 0.0–0.1)
Basophils Relative: 1 %
EOS PCT: 3 %
Eosinophils Absolute: 0.1 10*3/uL (ref 0.0–0.5)
HCT: 33.6 % — ABNORMAL LOW (ref 34.8–46.6)
Hemoglobin: 11.1 g/dL — ABNORMAL LOW (ref 11.6–15.9)
LYMPHS ABS: 0.5 10*3/uL — AB (ref 0.9–3.3)
Lymphocytes Relative: 17 %
MCH: 26.7 pg (ref 25.1–34.0)
MCHC: 33.1 g/dL (ref 31.5–36.0)
MCV: 80.5 fL (ref 79.5–101.0)
MONOS PCT: 9 %
Monocytes Absolute: 0.3 10*3/uL (ref 0.1–0.9)
Neutro Abs: 2.1 10*3/uL (ref 1.5–6.5)
Neutrophils Relative %: 70 %
PLATELETS: 205 10*3/uL (ref 145–400)
RBC: 4.18 MIL/uL (ref 3.70–5.45)
RDW: 14.1 % (ref 11.2–14.5)
WBC Count: 2.9 10*3/uL — ABNORMAL LOW (ref 3.9–10.3)

## 2017-09-06 LAB — CMP (CANCER CENTER ONLY)
ALBUMIN: 4 g/dL (ref 3.5–5.0)
ALK PHOS: 106 U/L (ref 40–150)
ALT: 14 U/L (ref 0–55)
ANION GAP: 6 (ref 3–11)
AST: 15 U/L (ref 5–34)
BILIRUBIN TOTAL: 0.3 mg/dL (ref 0.2–1.2)
BUN: 9 mg/dL (ref 7–26)
CALCIUM: 10.4 mg/dL (ref 8.4–10.4)
CO2: 26 mmol/L (ref 22–29)
Chloride: 106 mmol/L (ref 98–109)
Creatinine: 0.72 mg/dL (ref 0.60–1.10)
GFR, Est AFR Am: >60 mL/min (ref >60)
GFR, Estimated: >60 mL/min (ref >60)
GLUCOSE: 88 mg/dL (ref 70–140)
Potassium: 3.7 mmol/L (ref 3.5–5.1)
SODIUM: 138 mmol/L (ref 136–145)
Total Protein: 7.6 g/dL (ref 6.4–8.3)

## 2017-09-06 NOTE — Progress Notes (Signed)
[facility] OFFICE PROGRESS NOTE   Diagnosis: Rectal cancer  INTERVAL HISTORY:   Donna Brandt returns as scheduled.  Xeloda was placed on hold 08/28/2017 due to bilateral leg numbness.  She was seen in the symptom management clinic 08/29/2017 and referred for an MRI of the lumbar spine.  She reports the numbness involving the lower legs has improved considerably.  The weakness has also improved.  She continues to have numbness over the soles of her feet.  She denies back pain.  No bowel or bladder dysfunction.  Her balance is better.  Objective:  Vital signs in last 24 hours:  Blood pressure 117/82, pulse 82, temperature 98.6 F (37 C), temperature source Oral, resp. rate 18, height 5' 7" (1.702 m), weight 182 lb (82.6 kg), SpO2 100 %.    HEENT: No thrush or ulcers. Resp: Lungs clear bilaterally. Cardio: Regular rate and rhythm. GI: Abdomen soft and nontender.  Right abdomen ileostomy. Vascular: No leg edema. Neuro: Motor strength 5/5.  Gait normal.   Lab Results:  Lab Results  Component Value Date   WBC 2.9 (L) 09/06/2017   HGB 11.7 08/20/2017   HCT 33.6 (L) 09/06/2017   MCV 80.5 09/06/2017   PLT 205 09/06/2017   NEUTROABS 2.1 09/06/2017    Imaging:  Mr Lumbar Spine Wo Contrast  Result Date: 09/05/2017 CLINICAL DATA:  Progressive lower extremity neuropathy and lower extremity weakness. History of rectal cancer. EXAM: MRI LUMBAR SPINE WITHOUT CONTRAST TECHNIQUE: Multiplanar, multisequence MR imaging of the lumbar spine was performed. No intravenous contrast was administered. COMPARISON:  None. FINDINGS: Segmentation:  5 lumbar type vertebral bodies. Alignment:  Normal Vertebrae: No evidence of metastatic disease, focal lesion or fracture. There are fatty changes of the marrow from lower L5 through the sacrum, presumably secondary to radiation. Conus medullaris and cauda equina: Conus extends to the L1 level. Conus and cauda equina appear normal. Paraspinal  and other soft tissues: Negative Disc levels: No disc disease.  No stenosis.  No neural compression. IMPRESSION: No evidence of metastatic disease to the lumbosacral region. No stenosis or neural compression. Fatty marrow changes from mid L5 through the sacrum, presumably secondary to previous radiation. Electronically Signed   By: Nelson Chimes M.D.   On: 09/05/2017 20:53    Medications: I have reviewed the patient's current medications.  Assessment/Plan: 1. Rectal cancer, clinical stage T3b,N0,M0  colonoscopy 03/06/2017-biopsy of a rectal mass revealed fragments of an adenomatous lesion with high-grade dysplasia, definite submucosa not available to evaluate for invasion  Proctoscopy/biopsy 03/23/2017 confirmed a mass at 6-7 centimeters from the anal verge, biopsy revealed polypoid colorectal mucosa with detached fragments of adenomatous mucosa  Staging CTs 03/16/2017, anterior rectal mass, no evidence of metastatic disease, no adenopathy,  MRI 03/21/2017-T3b,N0rectal tumor  Initiationof radiation and concurrent Xeloda 04/23/2017.Completed 05/30/2017.  Laparoscopic low anterior resection and diverting ileostomy 07/26/2017,ypT2,ypN0tumor with negative surgical margins, normal mismatch repair protein expression  Cycle 1 adjuvant Xeloda 08/20/2017  Xeloda placed on hold beginning 08/28/2017 due to bilateral leg numbness  Xeloda discontinued  2. Hypertension  3. 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   Bilateral foot/leg numbness and weakness.  MRI lumbar spine 09/05/2017-no evidence of metastatic disease to the lumbosacral spine.  No stenosis or neural compression.  Fatty marrow changes from mid L5 through the sacrum presumably secondary to previous radiation.  Leg strength improved on exam 09/06/2017.  Per patient report numbness improved, still present over the soles of both feet.   Disposition:  Donna Brandt appears stable.  The bilateral foot/leg  numbness and weakness have improved though she continues to have numbness over the soles of both feet.  We discussed that peripheral neuropathy associated with 5-fluorouracil is an uncommon event but has been reported.  Dr. Benay Spice recommends discontinuation of Xeloda due to the severity of her symptoms in combination with the early pathologic stage of her rectal cancer.  She is in agreement with this plan.  She will return for a follow-up visit in 3 months.  Patient seen with Dr. Benay Spice.  Ned Card ANP/GNP-BC   09/06/2017  4:36 PM  This was a shared visit with Ned Card.  Donna Brandt developed progressive foot and leg numbness during the first cycle of adjuvant Xeloda.  The foot numbness persists, the leg numbness has resolved.  She currently has no leg weakness or back pain. 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  This is a rare toxicity associated with 5-fluorouracil.  We discussed the risk/benefit of continuing Xeloda.  Donna Brandt has a good prognosis for a long-term disease-free survival from the rectal cancer.  We decided to discontinue the adjuvant Xeloda.  She is comfortable with observation. She will be scheduled for ileostomy reversal by Dr. Dema Severin.  Donna Brandt will return in 3 months to follow-up on the persistent foot numbness.  Julieanne Manson, MD

## 2017-09-07 ENCOUNTER — Telehealth: Payer: Self-pay | Admitting: Nurse Practitioner

## 2017-09-07 NOTE — Telephone Encounter (Signed)
Scheduled appt per 1/31 los - Sent reminder letter in the mail - f/u in 3 months.

## 2017-09-11 ENCOUNTER — Other Ambulatory Visit: Payer: Self-pay | Admitting: Oncology

## 2017-09-11 DIAGNOSIS — C20 Malignant neoplasm of rectum: Secondary | ICD-10-CM

## 2017-09-27 ENCOUNTER — Ambulatory Visit: Payer: Self-pay | Admitting: Surgery

## 2017-09-27 DIAGNOSIS — H109 Unspecified conjunctivitis: Secondary | ICD-10-CM | POA: Diagnosis not present

## 2017-09-28 ENCOUNTER — Other Ambulatory Visit: Payer: Self-pay | Admitting: Surgery

## 2017-09-28 DIAGNOSIS — C20 Malignant neoplasm of rectum: Secondary | ICD-10-CM

## 2017-10-01 ENCOUNTER — Ambulatory Visit
Admission: RE | Admit: 2017-10-01 | Discharge: 2017-10-01 | Disposition: A | Discharge status: Home / Self Care | Payer: BLUE CROSS/BLUE SHIELD | Source: Ambulatory Visit | Attending: Surgery | Admitting: Surgery

## 2017-10-01 DIAGNOSIS — C20 Malignant neoplasm of rectum: Secondary | ICD-10-CM

## 2017-10-11 ENCOUNTER — Encounter (HOSPITAL_COMMUNITY): Payer: Self-pay

## 2017-10-12 ENCOUNTER — Encounter (HOSPITAL_COMMUNITY): Payer: Self-pay

## 2017-10-12 ENCOUNTER — Other Ambulatory Visit: Payer: Self-pay

## 2017-10-18 NOTE — Anesthesia Preprocedure Evaluation (Addendum)
Anesthesia Evaluation  Patient identified by MRN, date of birth, ID band Patient awake    Reviewed: Allergy & Precautions, NPO status , Patient's Chart, lab work & pertinent test results  Airway Mallampati: II  TM Distance: >3 FB Neck ROM: Full    Dental no notable dental hx.    Pulmonary neg pulmonary ROS,    Pulmonary exam normal breath sounds clear to auscultation       Cardiovascular hypertension, Normal cardiovascular exam Rhythm:Regular Rate:Normal     Neuro/Psych negative neurological ROS     GI/Hepatic negative GI ROS,   Endo/Other    Renal/GU      Musculoskeletal   Abdominal   Peds  Hematology   Anesthesia Other Findings   Reproductive/Obstetrics                            Anesthesia Physical Anesthesia Plan  ASA: III  Anesthesia Plan: MAC   Post-op Pain Management:    Induction:   PONV Risk Score and Plan:   Airway Management Planned: Mask, Natural Airway and Nasal Cannula  Additional Equipment:   Intra-op Plan:   Post-operative Plan:   Informed Consent: I have reviewed the patients History and Physical, chart, labs and discussed the procedure including the risks, benefits and alternatives for the proposed anesthesia with the patient or authorized representative who has indicated his/her understanding and acceptance.     Plan Discussed with: CRNA  Anesthesia Plan Comments:         Anesthesia Quick Evaluation

## 2017-10-19 ENCOUNTER — Encounter (HOSPITAL_COMMUNITY)
Admission: RE | Disposition: A | Discharge status: Home / Self Care | Payer: Self-pay | Source: Ambulatory Visit | Attending: Surgery

## 2017-10-19 ENCOUNTER — Ambulatory Visit (HOSPITAL_COMMUNITY): Payer: BLUE CROSS/BLUE SHIELD | Admitting: Certified Registered Nurse Anesthetist

## 2017-10-19 ENCOUNTER — Ambulatory Visit (HOSPITAL_COMMUNITY)
Admission: RE | Admit: 2017-10-19 | Discharge: 2017-10-19 | Disposition: A | Discharge status: Home / Self Care | Payer: BLUE CROSS/BLUE SHIELD | Source: Ambulatory Visit | Attending: Surgery | Admitting: Surgery

## 2017-10-19 ENCOUNTER — Other Ambulatory Visit: Payer: Self-pay

## 2017-10-19 ENCOUNTER — Encounter (HOSPITAL_COMMUNITY): Payer: Self-pay

## 2017-10-19 DIAGNOSIS — Z980 Intestinal bypass and anastomosis status: Secondary | ICD-10-CM | POA: Insufficient documentation

## 2017-10-19 DIAGNOSIS — I10 Essential (primary) hypertension: Secondary | ICD-10-CM | POA: Diagnosis not present

## 2017-10-19 DIAGNOSIS — Z79899 Other long term (current) drug therapy: Secondary | ICD-10-CM | POA: Insufficient documentation

## 2017-10-19 DIAGNOSIS — F419 Anxiety disorder, unspecified: Secondary | ICD-10-CM | POA: Diagnosis not present

## 2017-10-19 DIAGNOSIS — Z85048 Personal history of other malignant neoplasm of rectum, rectosigmoid junction, and anus: Secondary | ICD-10-CM | POA: Insufficient documentation

## 2017-10-19 DIAGNOSIS — Z885 Allergy status to narcotic agent status: Secondary | ICD-10-CM | POA: Diagnosis not present

## 2017-10-19 HISTORY — DX: Anxiety disorder, unspecified: F41.9

## 2017-10-19 HISTORY — PX: FLEXIBLE SIGMOIDOSCOPY: SHX5431

## 2017-10-19 SURGERY — SIGMOIDOSCOPY, FLEXIBLE
Anesthesia: Monitor Anesthesia Care

## 2017-10-19 MED ORDER — LACTATED RINGERS IV SOLN
INTRAVENOUS | Status: DC
  Administered 2017-10-19: 11:00:00 via INTRAVENOUS

## 2017-10-19 MED ORDER — PROPOFOL 500 MG/50ML IV EMUL
INTRAVENOUS | Status: DC | PRN
  Administered 2017-10-19: 150 ug/kg/min via INTRAVENOUS

## 2017-10-19 MED ORDER — SODIUM CHLORIDE 0.9 % IV SOLN: INTRAVENOUS | Status: DC

## 2017-10-19 MED ORDER — PROPOFOL 10 MG/ML IV BOLUS
INTRAVENOUS | Status: AC
  Filled 2017-10-19: qty 60

## 2017-10-19 MED ORDER — PROPOFOL 10 MG/ML IV BOLUS
INTRAVENOUS | Status: DC | PRN
  Administered 2017-10-19 (×3): 20 mg via INTRAVENOUS
  Administered 2017-10-19: 30 mg via INTRAVENOUS
  Administered 2017-10-19 (×2): 20 mg via INTRAVENOUS

## 2017-10-19 MED ORDER — LIDOCAINE 2% (20 MG/ML) 5 ML SYRINGE
INTRAMUSCULAR | Status: DC | PRN
  Administered 2017-10-19: 80 mg via INTRAVENOUS

## 2017-10-19 NOTE — Anesthesia Postprocedure Evaluation (Signed)
Anesthesia Post Note  Patient: Donna Brandt  Procedure(s) Performed: FLEXIBLE SIGMOIDOSCOPY (N/A )     Patient location during evaluation: Endoscopy Anesthesia Type: MAC Level of consciousness: awake and alert Pain management: pain level controlled Vital Signs Assessment: post-procedure vital signs reviewed and stable Respiratory status: spontaneous breathing, nonlabored ventilation, respiratory function stable and patient connected to nasal cannula oxygen Cardiovascular status: stable and blood pressure returned to baseline Postop Assessment: no apparent nausea or vomiting Anesthetic complications: no    Last Vitals:  Vitals:   10/19/17 1333 10/19/17 1340  BP: 129/80 121/86  Pulse: 62 68  Resp: 18 18  Temp: (!) 36.4 C   SpO2: 100% 100%    Last Pain:  Vitals:   10/19/17 1333  TempSrc: Oral                 Barnet Glasgow

## 2017-10-19 NOTE — Transfer of Care (Signed)
Immediate Anesthesia Transfer of Care Note  Patient: Donna Brandt  Procedure(s) Performed: FLEXIBLE SIGMOIDOSCOPY (N/A )  Patient Location: Endoscopy Unit  Anesthesia Type:MAC  Level of Consciousness: sedated  Airway & Oxygen Therapy: Patient Spontanous Breathing and Patient connected to face mask oxygen  Post-op Assessment: Report given to RN and Post -op Vital signs reviewed and stable  Post vital signs: Reviewed and stable  Last Vitals:  Vitals:   10/19/17 1032 10/19/17 1332  BP: 123/80   Pulse: 64 64  Resp: 18   Temp: 36.7 C   SpO2: 100% 100%    Last Pain:  Vitals:   10/19/17 1032  TempSrc: Oral         Complications: No apparent anesthesia complications

## 2017-10-19 NOTE — Op Note (Signed)
[facility] Patient Name: Donna Brandt Procedure Date: 10/19/2017 MRN: [id number] Attending MD: Ileana Roup MD, MD Date of Birth: November 16, 1975 CSN: [id number] Age: 42 Admit Type: Outpatient Procedure:                Flexible Sigmoidoscopy Indications:              Preoperative assessment Providers:                Sharon Mt. Rozella Servello MD, MD, Cleda Daub, RN,                            Tinnie Gens, Technician, Adair Laundry, CRNA Referring MD:              Medicines:                Sedation Administered by an Anesthesia                            Professional, Monitored Anesthesia Care Complications:            No immediate complications. Estimated Blood Loss:     Estimated blood loss was minimal. Procedure:                Pre-Anesthesia Assessment:                           - ASA Grade Assessment: II - A patient with mild                            systemic disease.                           - The anesthesia plan was to use monitored                            anesthesia care (MAC).                           After obtaining informed consent, the scope was                            passed under direct vision. The [id number] ([id number])                            scope was introduced through the anus and advanced                            to the the descending colon. The flexible                            sigmoidoscopy was accomplished without difficulty.                            The patient tolerated the procedure well. The                            quality of the bowel preparation was adequate. Scope  In: Scope Out: Findings:      The perianal and digital rectal examinations were normal.      There was evidence of a prior end-to-end colo-colonic anastomosis in the       distal rectum. This was patent and was characterized by healthy       appearing mucosa. The anastomosis was traversed. Impression:               - Patent end-to-end colo-colonic  anastomosis,                            characterized by healthy appearing mucosa.                           - No specimens collected. Moderate Sedation:      N/A- Per Anesthesia Care Recommendation:           - Continue present medications. Procedure Code(s):        --- Professional ---                           [phone], Sigmoidoscopy, flexible; diagnostic,                            including collection of specimen(s) by brushing or                            washing, when performed (separate procedure) Diagnosis Code(s):        --- Professional ---                           Z98.0, Intestinal bypass and anastomosis status                           Z01.818, Encounter for other preprocedural                            examination CPT copyright 2016 American [employer] Association. All rights reserved. The codes documented in this report are preliminary and upon coder review may  be revised to meet current compliance requirements. Nadeen Landau, MD Ileana Roup MD, MD 10/19/2017 1:32:40 PM This report has been signed electronically. Number of Addenda: 0

## 2017-10-19 NOTE — H&P (Signed)
CC: Here for flexible sigmoidoscopy  HPI: [name] is a pleasant 42yoF s/p LAR for rectal cancer and diverting loop ileostomy 07/26/17 following neoadjuvant chemoradiation. She recovered well from this. Pathology showed ypT2N0M0 (0/21LNs). Lesion was located on the posterior wall. Mesorectum intact. CRM uninvolved. All margins negative. She was seen by Dr. 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 Her ileostomy has been functioning well and the output has been controlled with Metamucil. She is not currently taking Imodium. She gets up 3 times per night into the bag. I recommended she begin taking a nighttime dose of Imodium to assist with this. She is not having significant mucus per rectum anymore. She does occasionally have the urge to defecate but nothing comes out.  Underwent GGE 10/01/17 which demonstrated no evidence for stricture or leak at the rectal anastomosis  Here today for flex sig   Past Medical History:  Diagnosis Date  . Anxiety   . Colon cancer ([place])    rectal  . Complication of anesthesia    during colonoscopy and wisdom teeth extraction  . Family history of breast cancer   . HTN (hypertension)   . Migraine     Past Surgical History:  Procedure Laterality Date  . ABDOMINAL HYSTERECTOMY     due to uterine fibroids  . AUGMENTATION MAMMAPLASTY Bilateral   . FLEXIBLE SIGMOIDOSCOPY N/A 04/12/2017   Procedure: FLEXIBLE SIGMOIDOSCOPY;  Surgeon: Milus Banister, MD;  Location: [facility];  Service: Endoscopy;  Laterality: N/A;  . FLEXIBLE SIGMOIDOSCOPY N/A 07/18/2017   Procedure: FLEXIBLE SIGMOIDOSCOPY EXAM UNDER ANESTHESIA;  Surgeon: Ileana Roup, MD;  Location: [facility];  Service: General;  Laterality: N/A;  . FLEXIBLE SIGMOIDOSCOPY  07/26/2017   Procedure: FLEXIBLE SIGMOIDOSCOPY;  Surgeon: Ileana Roup, MD;  Location: [facility];  Service:  General;;  . LAPAROSCOPIC LOW ANTERIOR RESECTION N/A 07/26/2017   Procedure: LAPAROSCOPIC VS OPEN  LOW ANTERIOR RESECTION WITH [place] LOOP ILEOSTOMY;  Surgeon: Ileana Roup, MD;  Location: [facility];  Service: General;  Laterality: N/A;  . PROCTOSCOPY  07/26/2017   Procedure: PROCTOSCOPY;  Surgeon: Ileana Roup, MD;  Location: [facility];  Service: General;;  . TONSILLECTOMY    . TUBAL LIGATION    . WISDOM TOOTH EXTRACTION      Family History  Problem Relation Age of Onset  . Breast cancer Other 45    Social:  reports that  has never smoked. she has never used smokeless tobacco. She reports that she drinks about 0.6 oz of alcohol per week. She reports that she does not use drugs.  Allergies:  Allergies  Allergen Reactions  . Dilaudid [Hydromorphone Hcl] Itching  . Latex Rash and Other (See Comments)    Skin sensitivity     Medications: I have reviewed the patient's current medications.  No results found for this or any previous visit (from the past 48 hour(s)).  No results found.  ROS - all of the below systems have been reviewed with the patient and positives are indicated with bold text General: chills, fever or night sweats Eyes: blurry vision or double vision ENT: epistaxis or sore throat Allergy/Immunology: itchy/watery eyes or nasal congestion Hematologic/Lymphatic: bleeding problems, blood clots or swollen lymph nodes Endocrine: temperature intolerance or unexpected weight changes Breast: new or changing breast lumps or nipple discharge Resp: cough, shortness of breath, or wheezing CV: chest pain or dyspnea on exertion GI: as per HPI GU:  dysuria, trouble voiding, or hematuria MSK: joint pain or joint stiffness Neuro: TIA or stroke symptoms Derm: pruritus and skin lesion changes Psych: anxiety and depression  PE Blood pressure 123/80, pulse 64, temperature 98 F (36.7 C), temperature source Oral, resp. rate 18, height 5\' 6"  (1.676 m), weight 83 kg  (183 lb), SpO2 100 %. Constitutional: NAD; conversant; no deformities Eyes: Moist conjunctiva; no lid lag; anicteric; PERRL Neck: Trachea midline; no thyromegaly Lungs: Normal respiratory effort; no tactile fremitus CV: RRR; no palpable thrills; no pitting edema GI: Abd soft, NT/ND; ileostomy working MSK: Normal gait; no clubbing/cyanosis Psychiatric: Appropriate affect; alert and oriented x3 Lymphatic: No palpable cervical or axillary lymphadenopathy   A/P: Donna Brandt is an 42 y.o. female with hx of low rectal cancer s/p LAR with double stapled colorectal anastomosis and DLI  Flexible sigmoidoscopy today -She is well aware of the anatomy and her personal configuration of her anastomosis -The planned procedure, material risks (including, but not limited to, pain, bleeding, infection, need for additional procedures, perforation, pneumonia, aspiration) benefits and alternatives were discussed. Her questions were answered to her satisfaction and she elected to proceed  Sharon Mt. Dema Severin, M.D. General and Colorectal Surgery [facility]

## 2017-10-19 NOTE — Discharge Instructions (Signed)

## 2017-10-22 ENCOUNTER — Encounter (HOSPITAL_COMMUNITY): Payer: Self-pay | Admitting: Surgery

## 2017-10-23 ENCOUNTER — Ambulatory Visit: Payer: Self-pay | Admitting: Surgery

## 2017-10-23 NOTE — H&P (Signed)
CC: Here for f/u - to discuss loop ileostomy closure  HPI: [name] is a pleasant 42yoF s/p LAR for rectal cancer and diverting loop ileostomy 07/26/17 following neoadjuvant chemoradiation. She recovered well from this. Pathology showed ypT2N0M0 (0/21LNs). Lesion was located on the posterior wall. Mesorectum intact. CRM uninvolved. All margins negative. She was seen by Dr. 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 Her ileostomy has been functioning well and the output has been controlled with Metamucil. She is not currently taking Imodium. She gets up 3 times per night into the bag. I recommended she begin taking a nighttime dose of Imodium to assist with this. She is not having significant mucus per rectum anymore. She does occasionally have the urge to defecate but nothing comes out.  GGE 10/01/17 - no evidence of leak/stricture at colorectal anastomosis  Flex sig 10/19/17 - normal appearing patent anastomosis  Denies any complaints today - feeling well. Stoma working well.  PMH: as above PSH: as above FHx: Denies FHx of malignancy Social: Denies use of tobacco/EtOH/drugs ROS: A comprehensive 10 system review of systems was completed with the patient and pertinent findings as noted above.  The patient is a 42 year old female.   Allergies Alean Rinne, [place]; 10/23/2017 11:16 AM) Dilaudid *ANALGESICS - OPIOID*  Latex Exam Gloves *MEDICAL DEVICES AND SUPPLIES*  Allergies Reconciled   Medication History Alean Rinne, RMA; 10/23/2017 11:17 AM) Sertraline HCl (100MG  Tablet, Oral) Active. Ondansetron HCl (4MG  Tablet, Oral) Active. Norvasc (10MG  Tablet, Oral) Active. Xeloda (500MG  Tablet, Oral) Active. Diflucan (150MG  Tablet, Oral) Active. Zofran (8MG  Tablet, Oral) Active. Oxybutynin Chloride (5MG  Tablet, Oral) Active. Pyridium (100MG  Tablet, Oral) Active. Probiotic  Advanced (Oral) Active. Compazine (10MG  Capsule ER, Oral) Active. Zoloft (50MG  Tablet, Oral) Active. Imitrex (100MG  Tablet, Oral) Active. Maxzide (75-50MG  Tablet, Oral) Active. Valtrex (500MG  Tablet, Oral) Active. Ibuprofen (600MG  Tablet, Oral) Active. Medications Reconciled    Review of Systems Harrell Gave M. Jalexis Breed MD; 10/23/2017 11:46 AM) General Not Present- Appetite Loss, Chills, Fatigue, Fever, Night Sweats, Weight Gain and Weight Loss. Skin Present- Dryness and Rash. Not Present- Change in Wart/Mole, Hives, Jaundice, New Lesions, Non-Healing Wounds and Ulcer. HEENT Present- Wears glasses/contact lenses. Not Present- Earache, Hearing Loss, Hoarseness, Nose Bleed, Oral Ulcers, Ringing in the Ears, Seasonal Allergies, Sinus Pain, Sore Throat, Visual Disturbances and Yellow Eyes. Respiratory Present- Snoring. Not Present- Bloody sputum, Chronic Cough, Difficulty Breathing and Wheezing. Breast Not Present- Breast Mass, Breast Pain, Nipple Discharge and Skin Changes. Cardiovascular Not Present- Chest Pain, Difficulty Breathing Lying Down, Leg Cramps, Palpitations, Rapid Heart Rate, Shortness of Breath and Swelling of Extremities. Gastrointestinal Present- Hemorrhoids. Not Present- Abdominal Pain, Bloating, Bloody Stool, Change in Bowel Habits, Chronic diarrhea, Constipation, Difficulty Swallowing, Excessive gas, Gets full quickly at meals, Indigestion, Nausea, Rectal Pain and Vomiting. Female Genitourinary Not Present- Frequency, Nocturia, Painful Urination, Pelvic Pain and Urgency. Musculoskeletal Not Present- Back Pain, Joint Pain, Joint Stiffness, Muscle Pain, Muscle Weakness and Swelling of Extremities. Neurological Present- Headaches and Weakness. Not Present- Decreased Memory, Fainting, Numbness, Seizures, Tingling, Tremor and Trouble walking. Psychiatric Present- Change in Sleep Pattern and Depression. Not Present- Anxiety, Bipolar, Fearful and Frequent crying. Endocrine  Present- Hair Changes and Hot flashes. Not Present- Cold Intolerance, Excessive Hunger, Heat Intolerance and New Diabetes.  Vitals Alean Rinne RMA; 10/23/2017 11:16 AM) 10/23/2017 11:16 AM Weight: 187 lb Height: 67in Body Surface Area: 1.97 m Body Mass Index: 29.29 kg/m  Temp.: 98.50F  Pulse: 92 (Regular)  BP: 120/78 (Sitting, Left Arm, Standard)       Physical Exam Harrell Gave M. Kerrie Latour MD; 10/23/2017 11:47 AM) The physical exam findings are as follows: Note:Constitutional: No acute distress; conversant; no deformities Eyes: Moist conjunctiva; no lid lag; anicteric sclerae; pupils equal round and reactive to light Neck: Trachea midline; no palpable thyromegaly Lungs: Normal respiratory effort; no tactile fremitus CV: Regular rate and rhythm; no palpable thrill; no pitting edema GI: Abdomen soft, nontender, nondistended; no palpable hepatosplenomegaly; ileostomy with appliance in place - green thick output in bag MSK: Normal gait; no clubbing/cyanosis Psychiatric: Appropriate affect; alert and oriented 3 Lymphatic: No palpable cervical or axillary lymphadenopathy    Assessment & Plan Harrell Gave M. Marykathleen Russi MD; 10/23/2017 11:49 AM) RECTAL CANCER ([facility]) Impression: [name] is a pleasant 42F with mid vs low rectal cancer - cT3N0M0 - s/p neoadjuvant chemoXRT completed 05/30/17 - OR 07/26/17 for laparoscopic ultralow anterior resection with diverting loop ileostomy. Final pathology- ypT2N0M0. Did not tolerate adjuvant Xeloda due to neuropathy in her feet and only received 1 cycle on 08/28/17. No further adjuvant therapy planned at this time GGE and flex sig clear -Will schedule for diverting loop ileostomy closure -The anatomy and physiology of the GI tract was discussed at length with the patient. The pathophysiology of rectal cancer and ileostomies was discussed at length with associated pictures and illustrations. -The planned procedure, material risks (including but  not limited to pain, bleeding, infection, damage to surrounding structures/viscus, leak from anastomoses, need for additional procedures, need for blood transfusion, poor rectal/fecal function postoperitively including incontinence, pelvic pain, hernia, pneumonia, heart attack, stroke death) benefits and alternatives were discussed. Her and her husband's questions were answered to their satisfaction and they elected to proceed with surgery.  Signed electronically by Ileana Roup, MD (10/23/2017 11:50 AM)

## 2017-11-15 NOTE — Progress Notes (Signed)
07/23/2017- noted in Epic- EKG

## 2017-11-15 NOTE — Patient Instructions (Signed)
Donna Brandt  11/15/2017   Your procedure is scheduled on: Wednesday 11/28/2017  Report to [facility] Main  Entrance              Report to admitting at  0900 AM    Call this number if you have problems the morning of surgery [phone]     Remember: Do not eat food :After Midnight.   NO SOLID FOOD AFTER MIDNIGHT THE NIGHT PRIOR TO SURGERY. NOTHING BY MOUTH EXCEPT CLEAR LIQUIDS UNTIL 3 HOURS PRIOR TO [place] SURGERY. PLEASE FINISH ENSURE DRINK PER SURGEON ORDER 3 HOURS PRIOR TO SCHEDULED SURGERY TIME WHICH NEEDS TO BE COMPLETED AT 0800 am.    CLEAR LIQUID DIET   Foods Allowed                                                                     Foods Excluded  Coffee and tea, regular and decaf                             liquids that you cannot  Plain Jell-O in any flavor                                             see through such as: Fruit ices (not with fruit pulp)                                     milk, soups, orange juice  Iced Popsicles                                    All solid food Carbonated beverages, regular and diet                                    Cranberry, grape and apple juices Sports drinks like Gatorade Lightly seasoned clear broth or consume(fat free) Sugar, honey syrup  Sample Menu Breakfast                                Lunch                                     Supper Cranberry juice                    Beef broth                            Chicken broth Jell-O  Grape juice                           Apple juice Coffee or tea                        Jell-O                                      Popsicle                                                Coffee or tea                        Coffee or tea  _____________________________________________________________________     Take these medicines the morning of surgery with A SIP OF WATER: Amlodipine (Norvasc), Sertraline (Zoloft)                         You may not have any metal on your body including hair pins and              piercings  Do not wear jewelry, make-up, lotions, powders or perfumes, deodorant             Do not wear nail polish.  Do not shave  48 hours prior to surgery.              Men may shave face and neck.   Do not bring valuables to the hospital. [place].  Contacts, dentures or bridgework may not be worn into surgery.  Leave suitcase in the car. After surgery it may be brought to your room.                  Please read over the following fact sheets you were given: _____________________________________________________________________             [facility] - Preparing for Surgery Before surgery, you can play an important role.  Because skin is not sterile, your skin needs to be as free of germs as possible.  You can reduce the number of germs on your skin by washing with CHG (chlorahexidine gluconate) soap before surgery.  CHG is an antiseptic cleaner which kills germs and bonds with the skin to continue killing germs even after washing. Please DO NOT use if you have an allergy to CHG or antibacterial soaps.  If your skin becomes reddened/irritated stop using the CHG and inform your nurse when you arrive at Short Stay. Do not shave (including legs and underarms) for at least 48 hours prior to the first CHG shower.  You may shave your face/neck. Please follow these instructions carefully:  1.  Shower with CHG Soap the night before surgery and the  morning of Surgery.  2.  If you choose to wash your hair, wash your hair first as usual with your  normal  shampoo.  3.  After you shampoo, rinse your hair and body thoroughly to remove the  shampoo.  4.  Use CHG as you would any other liquid soap.  You can apply chg directly  to the skin and wash                       Gently with a scrungie or clean washcloth.  5.  Apply the  CHG Soap to your body ONLY FROM THE NECK DOWN.   Do not use on face/ open                           Wound or open sores. Avoid contact with eyes, ears mouth and genitals (private parts).                       Wash face,  Genitals (private parts) with your normal soap.             6.  Wash thoroughly, paying special attention to the area where your surgery  will be performed.  7.  Thoroughly rinse your body with warm water from the neck down.  8.  DO NOT shower/wash with your normal soap after using and rinsing off  the CHG Soap.                9.  Pat yourself dry with a clean towel.            10.  Wear clean pajamas.            11.  Place clean sheets on your bed the night of your first shower and do not  sleep with pets. Day of Surgery : Do not apply any lotions/deodorants the morning of surgery.  Please wear clean clothes to the hospital/surgery center.  FAILURE TO FOLLOW THESE INSTRUCTIONS MAY RESULT IN THE CANCELLATION OF YOUR SURGERY PATIENT SIGNATURE_________________________________  NURSE SIGNATURE__________________________________  ________________________________________________________________________

## 2017-11-16 ENCOUNTER — Other Ambulatory Visit: Payer: Self-pay

## 2017-11-16 ENCOUNTER — Encounter (HOSPITAL_COMMUNITY): Payer: Self-pay

## 2017-11-16 ENCOUNTER — Encounter (HOSPITAL_COMMUNITY)
Admission: RE | Admit: 2017-11-16 | Discharge: 2017-11-16 | Disposition: A | Discharge status: Home / Self Care | Payer: BLUE CROSS/BLUE SHIELD | Source: Ambulatory Visit | Attending: Surgery | Admitting: Surgery

## 2017-11-16 DIAGNOSIS — Z01812 Encounter for preprocedural laboratory examination: Secondary | ICD-10-CM | POA: Insufficient documentation

## 2017-11-16 DIAGNOSIS — Z932 Ileostomy status: Secondary | ICD-10-CM | POA: Diagnosis not present

## 2017-11-16 DIAGNOSIS — Z85048 Personal history of other malignant neoplasm of rectum, rectosigmoid junction, and anus: Secondary | ICD-10-CM | POA: Insufficient documentation

## 2017-11-16 LAB — COMPREHENSIVE METABOLIC PANEL
ALK PHOS: 114 U/L (ref 38–126)
ALT: 23 U/L (ref 14–54)
ANION GAP: 7 (ref 5–15)
AST: 21 U/L (ref 15–41)
Albumin: 4 g/dL (ref 3.5–5.0)
BILIRUBIN TOTAL: 0.4 mg/dL (ref 0.3–1.2)
BUN: 14 mg/dL (ref 6–20)
CALCIUM: 10.4 mg/dL — AB (ref 8.9–10.3)
CO2: 23 mmol/L (ref 22–32)
Chloride: 110 mmol/L (ref 101–111)
Creatinine, Ser: 0.55 mg/dL (ref 0.44–1.00)
GLUCOSE: 124 mg/dL — AB (ref 65–99)
POTASSIUM: 3.8 mmol/L (ref 3.5–5.1)
Sodium: 140 mmol/L (ref 135–145)
TOTAL PROTEIN: 7.5 g/dL (ref 6.5–8.1)

## 2017-11-16 LAB — CBC WITH DIFFERENTIAL/PLATELET
BASOS PCT: 0 %
Basophils Absolute: 0 10*3/uL (ref 0.0–0.1)
Eosinophils Absolute: 0 10*3/uL (ref 0.0–0.7)
Eosinophils Relative: 1 %
HEMATOCRIT: 34.5 % — AB (ref 36.0–46.0)
HEMOGLOBIN: 11.3 g/dL — AB (ref 12.0–15.0)
LYMPHS ABS: 0.6 10*3/uL — AB (ref 0.7–4.0)
Lymphocytes Relative: 18 %
MCH: 26.2 pg (ref 26.0–34.0)
MCHC: 32.8 g/dL (ref 30.0–36.0)
MCV: 79.9 fL (ref 78.0–100.0)
MONOS PCT: 10 %
Monocytes Absolute: 0.3 10*3/uL (ref 0.1–1.0)
NEUTROS ABS: 2.4 10*3/uL (ref 1.7–7.7)
NEUTROS PCT: 71 %
Platelets: 175 10*3/uL (ref 150–400)
RBC: 4.32 MIL/uL (ref 3.87–5.11)
RDW: 14.3 % (ref 11.5–15.5)
WBC: 3.4 10*3/uL — ABNORMAL LOW (ref 4.0–10.5)

## 2017-11-16 NOTE — Progress Notes (Signed)
Asked patient if Dr. Dema Severin ordered a bowel prep on her and she said No. Instructed patient to call Dr. Orest Dikes office and follow any instructions he gives her for a bowel prep. Patient verbalized understanding.

## 2017-11-28 ENCOUNTER — Encounter (HOSPITAL_COMMUNITY)
Admission: RE | Disposition: A | Discharge status: Home / Self Care | Payer: Self-pay | Source: Ambulatory Visit | Attending: Surgery

## 2017-11-28 ENCOUNTER — Inpatient Hospital Stay (HOSPITAL_COMMUNITY): Payer: BLUE CROSS/BLUE SHIELD | Admitting: Anesthesiology

## 2017-11-28 ENCOUNTER — Encounter (HOSPITAL_COMMUNITY): Payer: Self-pay

## 2017-11-28 ENCOUNTER — Other Ambulatory Visit: Payer: Self-pay

## 2017-11-28 ENCOUNTER — Inpatient Hospital Stay (HOSPITAL_COMMUNITY)
Admission: RE | Admit: 2017-11-28 | Discharge: 2017-12-02 | DRG: 330 | Disposition: A | Discharge status: Home / Self Care | Payer: BLUE CROSS/BLUE SHIELD | Source: Ambulatory Visit | Attending: Surgery | Admitting: Surgery

## 2017-11-28 DIAGNOSIS — G43909 Migraine, unspecified, not intractable, without status migrainosus: Secondary | ICD-10-CM | POA: Diagnosis present

## 2017-11-28 DIAGNOSIS — Z432 Encounter for attention to ileostomy: Principal | ICD-10-CM

## 2017-11-28 DIAGNOSIS — I10 Essential (primary) hypertension: Secondary | ICD-10-CM | POA: Diagnosis present

## 2017-11-28 DIAGNOSIS — Z85048 Personal history of other malignant neoplasm of rectum, rectosigmoid junction, and anus: Secondary | ICD-10-CM

## 2017-11-28 DIAGNOSIS — F419 Anxiety disorder, unspecified: Secondary | ICD-10-CM | POA: Diagnosis present

## 2017-11-28 DIAGNOSIS — Z91040 Latex allergy status: Secondary | ICD-10-CM | POA: Diagnosis not present

## 2017-11-28 DIAGNOSIS — Z885 Allergy status to narcotic agent status: Secondary | ICD-10-CM

## 2017-11-28 DIAGNOSIS — K567 Ileus, unspecified: Secondary | ICD-10-CM | POA: Diagnosis not present

## 2017-11-28 DIAGNOSIS — C20 Malignant neoplasm of rectum: Secondary | ICD-10-CM | POA: Diagnosis present

## 2017-11-28 HISTORY — PX: ILEOSTOMY: SHX1783

## 2017-11-28 SURGERY — CREATION, ILEOSTOMY
Anesthesia: General

## 2017-11-28 MED ORDER — ACETAMINOPHEN 500 MG PO TABS
1000.0000 mg | ORAL_TABLET | Freq: Four times a day (QID) | ORAL | Status: AC
  Administered 2017-11-28 – 2017-11-29 (×4): 1000 mg via ORAL
  Filled 2017-11-28 (×4): qty 2

## 2017-11-28 MED ORDER — CELECOXIB 200 MG PO CAPS
200.0000 mg | ORAL_CAPSULE | ORAL | Status: AC
  Administered 2017-11-28: 200 mg via ORAL
  Filled 2017-11-28: qty 1

## 2017-11-28 MED ORDER — LIDOCAINE HCL 2 % IJ SOLN
INTRAMUSCULAR | Status: AC
  Filled 2017-11-28: qty 20

## 2017-11-28 MED ORDER — ONDANSETRON HCL 4 MG PO TABS: 4.0000 mg | ORAL_TABLET | Freq: Four times a day (QID) | ORAL | Status: DC | PRN

## 2017-11-28 MED ORDER — SODIUM CHLORIDE 0.9 % IJ SOLN
INTRAMUSCULAR | Status: AC
  Filled 2017-11-28: qty 50

## 2017-11-28 MED ORDER — BUPIVACAINE-EPINEPHRINE (PF) 0.5% -1:200000 IJ SOLN
INTRAMUSCULAR | Status: AC
  Filled 2017-11-28: qty 30

## 2017-11-28 MED ORDER — HEPARIN SODIUM (PORCINE) 5000 UNIT/ML IJ SOLN
5000.0000 [IU] | Freq: Three times a day (TID) | INTRAMUSCULAR | Status: DC
  Administered 2017-11-28 – 2017-12-02 (×10): 5000 [IU] via SUBCUTANEOUS
  Filled 2017-11-28 (×10): qty 1

## 2017-11-28 MED ORDER — SUMATRIPTAN SUCCINATE 50 MG PO TABS
100.0000 mg | ORAL_TABLET | ORAL | Status: DC | PRN
  Filled 2017-11-28: qty 2

## 2017-11-28 MED ORDER — FENTANYL CITRATE (PF) 100 MCG/2ML IJ SOLN: 25.0000 ug | INTRAMUSCULAR | Status: DC | PRN

## 2017-11-28 MED ORDER — ENSURE SURGERY PO LIQD
237.0000 mL | Freq: Two times a day (BID) | ORAL | Status: DC
  Administered 2017-11-29 – 2017-12-01 (×5): 237 mL via ORAL
  Filled 2017-11-28 (×9): qty 237

## 2017-11-28 MED ORDER — DIPHENHYDRAMINE HCL 50 MG/ML IJ SOLN: 12.5000 mg | Freq: Four times a day (QID) | INTRAMUSCULAR | Status: DC | PRN

## 2017-11-28 MED ORDER — LIDOCAINE 2% (20 MG/ML) 5 ML SYRINGE
INTRAMUSCULAR | Status: DC | PRN
  Administered 2017-11-28: 1.5 mg/kg/h via INTRAVENOUS

## 2017-11-28 MED ORDER — LIDOCAINE 2% (20 MG/ML) 5 ML SYRINGE
INTRAMUSCULAR | Status: AC
  Filled 2017-11-28: qty 5

## 2017-11-28 MED ORDER — DIPHENHYDRAMINE HCL 12.5 MG/5ML PO ELIX: 12.5000 mg | ORAL_SOLUTION | Freq: Four times a day (QID) | ORAL | Status: DC | PRN

## 2017-11-28 MED ORDER — ONDANSETRON HCL 4 MG/2ML IJ SOLN
INTRAMUSCULAR | Status: AC
  Filled 2017-11-28: qty 2

## 2017-11-28 MED ORDER — ALUM & MAG HYDROXIDE-SIMETH 200-200-20 MG/5ML PO SUSP
30.0000 mL | Freq: Four times a day (QID) | ORAL | Status: DC | PRN
  Administered 2017-12-02: 30 mL via ORAL
  Filled 2017-11-28: qty 30

## 2017-11-28 MED ORDER — LIDOCAINE 2% (20 MG/ML) 5 ML SYRINGE
INTRAMUSCULAR | Status: DC | PRN
  Administered 2017-11-28: 100 mg via INTRAVENOUS

## 2017-11-28 MED ORDER — CHLORHEXIDINE GLUCONATE CLOTH 2 % EX PADS: 6.0000 | MEDICATED_PAD | Freq: Once | CUTANEOUS | Status: DC

## 2017-11-28 MED ORDER — LACTATED RINGERS IV SOLN
INTRAVENOUS | Status: DC
  Administered 2017-11-28 – 2017-12-02 (×8): via INTRAVENOUS

## 2017-11-28 MED ORDER — DEXAMETHASONE SODIUM PHOSPHATE 10 MG/ML IJ SOLN
INTRAMUSCULAR | Status: AC
  Filled 2017-11-28: qty 1

## 2017-11-28 MED ORDER — 0.9 % SODIUM CHLORIDE (POUR BTL) OPTIME
TOPICAL | Status: DC | PRN
  Administered 2017-11-28: 2000 mL

## 2017-11-28 MED ORDER — GABAPENTIN 300 MG PO CAPS
300.0000 mg | ORAL_CAPSULE | ORAL | Status: AC
  Administered 2017-11-28: 300 mg via ORAL
  Filled 2017-11-28: qty 1

## 2017-11-28 MED ORDER — ROCURONIUM BROMIDE 10 MG/ML (PF) SYRINGE
PREFILLED_SYRINGE | INTRAVENOUS | Status: AC
  Filled 2017-11-28: qty 5

## 2017-11-28 MED ORDER — SUGAMMADEX SODIUM 200 MG/2ML IV SOLN
INTRAVENOUS | Status: AC
  Filled 2017-11-28: qty 2

## 2017-11-28 MED ORDER — AMLODIPINE BESYLATE 10 MG PO TABS
10.0000 mg | ORAL_TABLET | Freq: Every day | ORAL | Status: DC
  Administered 2017-11-29 – 2017-12-02 (×4): 10 mg via ORAL
  Filled 2017-11-28 (×4): qty 1

## 2017-11-28 MED ORDER — MIDAZOLAM HCL 2 MG/2ML IJ SOLN
INTRAMUSCULAR | Status: DC | PRN
  Administered 2017-11-28: 2 mg via INTRAVENOUS

## 2017-11-28 MED ORDER — PROPOFOL 10 MG/ML IV BOLUS
INTRAVENOUS | Status: AC
  Filled 2017-11-28: qty 20

## 2017-11-28 MED ORDER — ROCURONIUM BROMIDE 10 MG/ML (PF) SYRINGE
PREFILLED_SYRINGE | INTRAVENOUS | Status: DC | PRN
  Administered 2017-11-28: 50 mg via INTRAVENOUS
  Administered 2017-11-28 (×2): 10 mg via INTRAVENOUS

## 2017-11-28 MED ORDER — CEFOTETAN DISODIUM-DEXTROSE 2-2.08 GM-%(50ML) IV SOLR
2.0000 g | INTRAVENOUS | Status: AC
  Administered 2017-11-28: 2 g via INTRAVENOUS
  Filled 2017-11-28: qty 50

## 2017-11-28 MED ORDER — GABAPENTIN 300 MG PO CAPS
300.0000 mg | ORAL_CAPSULE | Freq: Three times a day (TID) | ORAL | Status: DC
  Administered 2017-11-28 – 2017-12-02 (×12): 300 mg via ORAL
  Filled 2017-11-28 (×12): qty 1

## 2017-11-28 MED ORDER — MORPHINE SULFATE (PF) 2 MG/ML IV SOLN
2.0000 mg | INTRAVENOUS | Status: DC | PRN
  Administered 2017-11-28 – 2017-12-01 (×11): 2 mg via INTRAVENOUS
  Filled 2017-11-28 (×12): qty 1

## 2017-11-28 MED ORDER — LACTATED RINGERS IV SOLN
INTRAVENOUS | Status: DC
  Administered 2017-11-28 (×2): via INTRAVENOUS

## 2017-11-28 MED ORDER — SUGAMMADEX SODIUM 200 MG/2ML IV SOLN
INTRAVENOUS | Status: DC | PRN
  Administered 2017-11-28: 200 mg via INTRAVENOUS

## 2017-11-28 MED ORDER — DEXAMETHASONE SODIUM PHOSPHATE 10 MG/ML IJ SOLN
INTRAMUSCULAR | Status: DC | PRN
  Administered 2017-11-28: 10 mg via INTRAVENOUS

## 2017-11-28 MED ORDER — ONDANSETRON HCL 4 MG/2ML IJ SOLN
4.0000 mg | Freq: Four times a day (QID) | INTRAMUSCULAR | Status: DC | PRN
  Administered 2017-11-28 – 2017-11-29 (×2): 4 mg via INTRAVENOUS
  Filled 2017-11-28 (×2): qty 2

## 2017-11-28 MED ORDER — HYDRALAZINE HCL 20 MG/ML IJ SOLN: 10.0000 mg | INTRAMUSCULAR | Status: DC | PRN

## 2017-11-28 MED ORDER — SERTRALINE HCL 100 MG PO TABS
100.0000 mg | ORAL_TABLET | Freq: Every day | ORAL | Status: DC
  Administered 2017-11-29 – 2017-12-02 (×4): 100 mg via ORAL
  Filled 2017-11-28 (×4): qty 1

## 2017-11-28 MED ORDER — ACETAMINOPHEN 500 MG PO TABS
1000.0000 mg | ORAL_TABLET | ORAL | Status: AC
  Administered 2017-11-28: 1000 mg via ORAL
  Filled 2017-11-28: qty 2

## 2017-11-28 MED ORDER — FENTANYL CITRATE (PF) 100 MCG/2ML IJ SOLN
INTRAMUSCULAR | Status: AC
  Filled 2017-11-28: qty 2

## 2017-11-28 MED ORDER — EPHEDRINE SULFATE-NACL 50-0.9 MG/10ML-% IV SOSY
PREFILLED_SYRINGE | INTRAVENOUS | Status: DC | PRN
  Administered 2017-11-28 (×2): 5 mg via INTRAVENOUS

## 2017-11-28 MED ORDER — IBUPROFEN 200 MG PO TABS
600.0000 mg | ORAL_TABLET | Freq: Four times a day (QID) | ORAL | Status: DC
  Filled 2017-11-28: qty 3

## 2017-11-28 MED ORDER — BUPIVACAINE-EPINEPHRINE (PF) 0.5% -1:200000 IJ SOLN
INTRAMUSCULAR | Status: DC | PRN
  Administered 2017-11-28: 30 mL

## 2017-11-28 MED ORDER — VITAMIN B-6 100 MG PO TABS
100.0000 mg | ORAL_TABLET | Freq: Every day | ORAL | Status: DC
  Administered 2017-11-29 – 2017-12-02 (×4): 100 mg via ORAL
  Filled 2017-11-28 (×4): qty 1

## 2017-11-28 MED ORDER — SACCHAROMYCES BOULARDII 250 MG PO CAPS
250.0000 mg | ORAL_CAPSULE | Freq: Two times a day (BID) | ORAL | Status: DC
  Administered 2017-11-28 – 2017-12-02 (×8): 250 mg via ORAL
  Filled 2017-11-28 (×8): qty 1

## 2017-11-28 MED ORDER — BUPIVACAINE LIPOSOME 1.3 % IJ SUSP
20.0000 mL | Freq: Once | INTRAMUSCULAR | Status: AC
  Administered 2017-11-28: 20 mL
  Filled 2017-11-28: qty 20

## 2017-11-28 MED ORDER — PROPOFOL 10 MG/ML IV BOLUS
INTRAVENOUS | Status: DC | PRN
  Administered 2017-11-28: 150 mg via INTRAVENOUS

## 2017-11-28 MED ORDER — VALACYCLOVIR HCL 500 MG PO TABS
500.0000 mg | ORAL_TABLET | Freq: Every day | ORAL | Status: DC | PRN
  Filled 2017-11-28: qty 1

## 2017-11-28 MED ORDER — SODIUM CHLORIDE 0.9 % IJ SOLN
INTRAMUSCULAR | Status: DC | PRN
  Administered 2017-11-28: 50 mL

## 2017-11-28 MED ORDER — FENTANYL CITRATE (PF) 100 MCG/2ML IJ SOLN
INTRAMUSCULAR | Status: DC | PRN
  Administered 2017-11-28 (×2): 25 ug via INTRAVENOUS
  Administered 2017-11-28: 50 ug via INTRAVENOUS
  Administered 2017-11-28 (×2): 25 ug via INTRAVENOUS

## 2017-11-28 MED ORDER — EPHEDRINE 5 MG/ML INJ
INTRAVENOUS | Status: AC
  Filled 2017-11-28: qty 10

## 2017-11-28 MED ORDER — TRAMADOL HCL 50 MG PO TABS
50.0000 mg | ORAL_TABLET | Freq: Four times a day (QID) | ORAL | Status: DC | PRN
  Administered 2017-11-28 – 2017-12-02 (×2): 50 mg via ORAL
  Filled 2017-11-28 (×2): qty 1

## 2017-11-28 MED ORDER — KETAMINE HCL 10 MG/ML IJ SOLN
INTRAMUSCULAR | Status: AC
  Filled 2017-11-28: qty 1

## 2017-11-28 MED ORDER — KETOROLAC TROMETHAMINE 30 MG/ML IJ SOLN
30.0000 mg | Freq: Four times a day (QID) | INTRAMUSCULAR | Status: AC
  Administered 2017-11-28 – 2017-11-30 (×8): 30 mg via INTRAVENOUS
  Filled 2017-11-28 (×8): qty 1

## 2017-11-28 MED ORDER — ONDANSETRON HCL 4 MG/2ML IJ SOLN
INTRAMUSCULAR | Status: DC | PRN
  Administered 2017-11-28: 4 mg via INTRAVENOUS

## 2017-11-28 MED ORDER — MIDAZOLAM HCL 2 MG/2ML IJ SOLN
INTRAMUSCULAR | Status: AC
  Filled 2017-11-28: qty 2

## 2017-11-28 MED ORDER — KETAMINE HCL 10 MG/ML IJ SOLN
INTRAMUSCULAR | Status: DC | PRN
  Administered 2017-11-28: 10 mg via INTRAVENOUS
  Administered 2017-11-28: 20 mg via INTRAVENOUS

## 2017-11-28 SURGICAL SUPPLY — 60 items
BLADE EXTENDED COATED 6.5IN (ELECTRODE) IMPLANT
BLADE HEX COATED 2.75 (ELECTRODE) ×3 IMPLANT
BLADE SURG SZ10 CARB STEEL (BLADE) IMPLANT
BNDG GAUZE ELAST 4 BULKY (GAUZE/BANDAGES/DRESSINGS) ×3 IMPLANT
CLIP VESOCCLUDE LG 6/CT (CLIP) IMPLANT
COVER MAYO STAND STRL (DRAPES) ×3 IMPLANT
DRAIN CHANNEL 10F 3/8 F FF (DRAIN) IMPLANT
DRAPE LAPAROSCOPIC ABDOMINAL (DRAPES) ×3 IMPLANT
DRAPE SHEET LG 3/4 BI-LAMINATE (DRAPES) IMPLANT
DRAPE WARM FLUID 44X44 (DRAPE) ×3 IMPLANT
DRSG PAD ABDOMINAL 8X10 ST (GAUZE/BANDAGES/DRESSINGS) IMPLANT
ELECT REM PT RETURN 15FT ADLT (MISCELLANEOUS) ×3 IMPLANT
EVACUATOR DRAINAGE 10X20 100CC (DRAIN) IMPLANT
EVACUATOR SILICONE 100CC (DRAIN) IMPLANT
GAUZE 4X4 16PLY RFD (DISPOSABLE) IMPLANT
GAUZE SPONGE 4X4 12PLY STRL (GAUZE/BANDAGES/DRESSINGS) ×3 IMPLANT
GLOVE BIO SURGEON STRL SZ7.5 (GLOVE) ×3 IMPLANT
GLOVE BIOGEL PI IND STRL 7.0 (GLOVE) ×1 IMPLANT
GLOVE BIOGEL PI INDICATOR 7.0 (GLOVE) ×2
GLOVE INDICATOR 8.0 STRL GRN (GLOVE) ×6 IMPLANT
GOWN STRL REUS W/TWL LRG LVL3 (GOWN DISPOSABLE) ×3 IMPLANT
GOWN STRL REUS W/TWL XL LVL3 (GOWN DISPOSABLE) ×6 IMPLANT
HANDLE SUCTION POOLE (INSTRUMENTS) ×1 IMPLANT
KIT BASIN OR (CUSTOM PROCEDURE TRAY) ×3 IMPLANT
LEGGING LITHOTOMY PAIR STRL (DRAPES) IMPLANT
LIGASURE IMPACT 36 18CM CVD LR (INSTRUMENTS) ×3 IMPLANT
NS IRRIG 1000ML POUR BTL (IV SOLUTION) ×6 IMPLANT
PACK GENERAL/GYN (CUSTOM PROCEDURE TRAY) ×3 IMPLANT
PAD ABD 8X10 STRL (GAUZE/BANDAGES/DRESSINGS) ×3 IMPLANT
PAD TELFA 2X3 NADH STRL (GAUZE/BANDAGES/DRESSINGS) IMPLANT
RELOAD PROXIMATE 75MM BLUE (ENDOMECHANICALS) ×6 IMPLANT
SHEARS HARMONIC ACE PLUS 36CM (ENDOMECHANICALS) IMPLANT
STAPLER 90 3.5 STAND SLIM (STAPLE) ×3
STAPLER 90 3.5 STD SLIM (STAPLE) ×1 IMPLANT
STAPLER PROXIMATE 75MM BLUE (STAPLE) ×3 IMPLANT
STAPLER VISISTAT 35W (STAPLE) IMPLANT
SUCTION POOLE HANDLE (INSTRUMENTS) ×3
SUT CHROMIC 0 SH (SUTURE) IMPLANT
SUT NOV 1 T60/GS (SUTURE) IMPLANT
SUT NOVA NAB DX-16 0-1 5-0 T12 (SUTURE) IMPLANT
SUT NOVA T20/GS 25 (SUTURE) IMPLANT
SUT PDS AB 1 CTX 36 (SUTURE) ×6 IMPLANT
SUT SILK 2 0 (SUTURE)
SUT SILK 2 0 SH CR/8 (SUTURE) IMPLANT
SUT SILK 2 0SH CR/8 30 (SUTURE) ×3 IMPLANT
SUT SILK 2-0 18XBRD TIE 12 (SUTURE) IMPLANT
SUT SILK 2-0 30XBRD TIE 12 (SUTURE) IMPLANT
SUT SILK 3 0 (SUTURE)
SUT SILK 3 0 SH CR/8 (SUTURE) ×6 IMPLANT
SUT SILK 3-0 18XBRD TIE 12 (SUTURE) IMPLANT
SUT VIC AB 2-0 SH 18 (SUTURE) IMPLANT
SUT VIC AB 2-0 SH 27 (SUTURE) ×2
SUT VIC AB 2-0 SH 27X BRD (SUTURE) ×1 IMPLANT
SUT VIC AB 3-0 SH 18 (SUTURE) IMPLANT
SUT VICRYL 2 0 18  UND BR (SUTURE) ×4
SUT VICRYL 2 0 18 UND BR (SUTURE) ×2 IMPLANT
TOWEL OR 17X26 10 PK STRL BLUE (TOWEL DISPOSABLE) ×6 IMPLANT
TOWEL OR NON WOVEN STRL DISP B (DISPOSABLE) ×3 IMPLANT
TRAY FOLEY METER SIL LF 16FR (CATHETERS) ×3 IMPLANT
YANKAUER SUCT BULB TIP NO VENT (SUCTIONS) ×3 IMPLANT

## 2017-11-28 NOTE — Op Note (Signed)
11/28/2017  1:48 PM  PATIENT:  Donna Brandt  42 y.o. female  Patient Care Team: Patient, No Pcp Per as PCP - General (General Practice)  PRE-OPERATIVE DIAGNOSIS:  Rectal cancer, ileostomy status  POST-OPERATIVE DIAGNOSIS:  Rectal cancer, ileostomy status  PROCEDURE:  Closure of diverting loop ileostomy  SURGEON:  Sharon Mt. Donnae Michels, MD  ASSISTANT: None  ANESTHESIA:   general  COUNTS:  Sponge, needle and instrument counts were reported correct x2 at the conclusion of the operation.  EBL: 20cc  DRAINS: None  SPECIMEN: Ileostomy trim  COMPLICATIONS: None  FINDINGS: Diverting loop ileostomy -closure via peristomal incision -with stapled side to side, functional end to end enteroenterostomy.  DISPOSITION: PACU in satisfactory condition  INDICATION: [name] is a pleasant 42yo female s/p ultra low anterior resection for rectal cancer with double stapled colorectal anastomosis and diverting loop ileostomy 07/26/17 following neoadjuvant chemoradiation. She recovered well from this. Pathology showed ypT2N0M0 (0/21LNs). Lesion was located on the posterior wall. Mesorectum intact. CRM uninvolved. All margins negative. She was seen by Dr. 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 Her ileostomy has been functioning well and the output has been controlled with Metamucil.  She underwent a Gastrografin enema 2/201 9 which demonstrated no evidence of leak/stricture at colorectal anastomosis.  She subsequently underwent flexible sigmoidoscopy 10/19/17 which demonstrated a normal, patent colorectal anastomosis without evidence of anastomotic defects.  She represented to the office desiring ileostomy closure.  Please refer to the H&P for details regarding this discussion.  DESCRIPTION: The patient was identified in preop holding and taken to the OR where she was placed on the operating room table  and SCDs were placed. General endotracheal anesthesia was induced without difficulty. A foley catheter was then placed. The patient was then prepped and draped in the usual sterile fashion. A surgical timeout was performed indicating the correct patient, procedure, positioning and need for preoperative antibiotics.  A peristomal incision was then created around the loop ileostomy and carried down through the subcutaneous tissue.  Gentle dissection was employed sharply with Metzenbaum scissors.  This was carried down to the level of the fascia.  The adhesions of the ileostomy to the fascia were taken down sharply.  The peritoneum was identified up within the wound alongside the proximal end of the stoma.  This was entered sharply.  The ileostomy was then freed circumferentially from its surrounding peritoneal attachments.  At this point at least 20 cm of both proximal and distal ileum were easily eviscerated into the wound.  There were no significant intra-abdominal adhesions preventing mobilization.  Attention was then turned to resection of the ileostomy.  Prior to doing this, each respective limb of the ileostomy was gently occluded and then filled with Betadine to check for any deserosalization's.  No deserosalization's or enterotomies were identified.  Windows were created in the mesenteric border of the proximal and distal limbs.  The proximal and distal ends of the ileostomy were then divided with a GIA blue load stapler.  The intervening mesentery was then divided using a LigaSure.  Hemostasis was then verified.  Of note, the resected ileostomy included all of the ileum that was within the subcutaneous space such that there was no bowel left behind that was involved in dissection of the ileostomy. Attention was then turned to creating the enteroenteric anastomosis.  The corners of each respective staple line on the antimesenteric border were then opened sharply.  The  lumens were then entered and a  side-to-side, functional end-to-end enteroenteric anastomosis was then fashioned using a GIA 75 mm blue load.  The staple line was then inspected and noted to be hemostatic.  The common enterotomy was then closed using a blue load TA 90 mm stapler -the pre-existing staple lines from division of the ileostomy were included in this division such that only 2 staple lines remained.  Prior to closing the common enterotomy, the staple lines of the anastomosis were distracted from one another.  Hemostasis of the staple line was then achieved using 3-0 silk U stitches.  The corners of the staple line were then dunked with 3-0 silk suture.  Two 2-0 silk sutures were placed at the "crotch" of the anastomosis.  The small mesenteric defect was then approximated with interrupted 3-0 Vicryl sutures.  The staple lines were then reinspected and noted to be hemostatic.  The anastomosis was then inspected and both ends appeared healthy and pink.  The anastomosis was then palpated and noted to be widely patent.  The bowel was then returned to the peritoneal cavity.  Attention was then turned to closing the fascia.  Counts were then reported correct x2.  The fascia was then closed with 2 running #1 PDS sutures. The wound was inspected, irrigated and noted to be hemostatic.  The skin of the ileostomy site was cinched down using a 2-0 Vicryl pursestring suture.  A moist wick of Kerlix was then inserted into the prior stoma site.  The patient was then awakened from general anesthesia, extubated, and transferred to a stretcher for transport to PACU in satisfactory condition.     Note: This dictation was prepared with Dragon/digital dictation along with [employer]. Any transcriptional errors that result from this process are unintentional.

## 2017-11-28 NOTE — Anesthesia Procedure Notes (Signed)
Procedure Name: Intubation Date/Time: 11/28/2017 11:05 AM Performed by: Dione Booze, CRNA Pre-anesthesia Checklist: Patient being monitored, Suction available, Emergency Drugs available and Patient identified Patient Re-evaluated:Patient Re-evaluated prior to induction Oxygen Delivery Method: Circle system utilized Preoxygenation: Pre-oxygenation with 100% oxygen Induction Type: IV induction Ventilation: Mask ventilation without difficulty and Oral airway inserted - appropriate to patient size Laryngoscope Size: Mac and 4 Grade View: Grade II Tube type: Oral Tube size: 7.5 mm Number of attempts: 1 Airway Equipment and Method: Stylet Placement Confirmation: ETT inserted through vocal cords under direct vision,  positive ETCO2 and breath sounds checked- equal and bilateral Secured at: 22 cm Tube secured with: Tape Dental Injury: Teeth and Oropharynx as per pre-operative assessment

## 2017-11-28 NOTE — Anesthesia Preprocedure Evaluation (Signed)
Anesthesia Evaluation  Patient identified by MRN, date of birth, ID band Patient awake    Reviewed: Allergy & Precautions, NPO status , Patient's Chart, lab work & pertinent test results  Airway Mallampati: II  TM Distance: >3 FB     Dental   Pulmonary    breath sounds clear to auscultation       Cardiovascular hypertension,  Rhythm:Regular Rate:Normal     Neuro/Psych  Headaches,    GI/Hepatic Neg liver ROS, History noted CG   Endo/Other  negative endocrine ROS  Renal/GU negative Renal ROS     Musculoskeletal   Abdominal   Peds  Hematology   Anesthesia Other Findings   Reproductive/Obstetrics                             Anesthesia Physical Anesthesia Plan  ASA: III  Anesthesia Plan: General   Post-op Pain Management:    Induction:   PONV Risk Score and Plan: 3 and Treatment may vary due to age or medical condition, Ondansetron, Dexamethasone and Midazolam  Airway Management Planned:   Additional Equipment:   Intra-op Plan:   Post-operative Plan: Possible Post-op intubation/ventilation  Informed Consent: I have reviewed the patients History and Physical, chart, labs and discussed the procedure including the risks, benefits and alternatives for the proposed anesthesia with the patient or authorized representative who has indicated his/her understanding and acceptance.   Dental advisory given  Plan Discussed with: CRNA and Anesthesiologist  Anesthesia Plan Comments:         Anesthesia Quick Evaluation

## 2017-11-28 NOTE — Transfer of Care (Signed)
Immediate Anesthesia Transfer of Care Note  Patient: Donna Brandt  Procedure(s) Performed: CLOSURE OF LOOP ILEOSTOMY ERAS PATHWAY (N/A )  Patient Location: PACU  Anesthesia Type:General  Level of Consciousness: sedated, drowsy and patient cooperative  Airway & Oxygen Therapy: Patient Spontanous Breathing and Patient connected to face mask oxygen  Post-op Assessment: Report given to RN and Post -op Vital signs reviewed and stable  Post vital signs: Reviewed and stable  Last Vitals:  Vitals Value Taken Time  BP 113/72 11/28/2017  2:09 PM  Temp    Pulse 81 11/28/2017  2:11 PM  Resp 17 11/28/2017  2:11 PM  SpO2 97 % 11/28/2017  2:11 PM  Vitals shown include unvalidated device data.  Last Pain:  Vitals:   11/28/17 0926  TempSrc:   PainSc: 2       Patients Stated Pain Goal: 1 (73/22/02 5427)  Complications: No apparent anesthesia complications

## 2017-11-28 NOTE — Anesthesia Postprocedure Evaluation (Signed)
Anesthesia Post Note  Patient: Donna Brandt  Procedure(s) Performed: CLOSURE OF LOOP ILEOSTOMY ERAS PATHWAY (N/A )     Patient location during evaluation: PACU Anesthesia Type: General Level of consciousness: awake Pain management: pain level controlled Vital Signs Assessment: post-procedure vital signs reviewed and stable Respiratory status: spontaneous breathing Cardiovascular status: stable Anesthetic complications: no    Last Vitals:  Vitals:   11/28/17 1409 11/28/17 1415  BP: 113/72 116/78  Pulse: 83 81  Resp: 13 17  Temp: 36.4 C   SpO2: 98% 99%    Last Pain:  Vitals:   11/28/17 1415  TempSrc:   PainSc: Asleep                 Corena Tilson

## 2017-11-28 NOTE — H&P (Signed)
CC: Here for surgery - loop ileostomy closure  HPI: Donna Brandt is a pleasant 42yo female s/p LAR for rectal cancer and diverting loop ileostomy 07/26/17 following neoadjuvant chemoradiation. She recovered well from this. Pathology showed ypT2N0M0 (0/21LNs). Lesion was located on the posterior wall. Mesorectum intact. CRM uninvolved. All margins negative. She was seen by Dr. 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 Her ileostomy has been functioning well and the output has been controlled with Metamucil.  GGE 10/01/17 - no evidence of leak/stricture at colorectal anastomosis  Flex sig 10/19/17 - normal appearing patent anastomosis  Denies any complaints today - feeling well - ready for surgery  Past Medical History:  Diagnosis Date  . Anxiety   . Colon cancer ([place])    rectal  . Complication of anesthesia    during colonoscopy and wisdom teeth extraction  . Family history of breast cancer   . HTN (hypertension)   . Migraine     Past Surgical History:  Procedure Laterality Date  . ABDOMINAL HYSTERECTOMY     due to uterine fibroids  . AUGMENTATION MAMMAPLASTY Bilateral   . FLEXIBLE SIGMOIDOSCOPY N/A 04/12/2017   Procedure: FLEXIBLE SIGMOIDOSCOPY;  Surgeon: Milus Banister, MD;  Location: [facility];  Service: Endoscopy;  Laterality: N/A;  . FLEXIBLE SIGMOIDOSCOPY N/A 07/18/2017   Procedure: FLEXIBLE SIGMOIDOSCOPY EXAM UNDER ANESTHESIA;  Surgeon: Ileana Roup, MD;  Location: [facility];  Service: General;  Laterality: N/A;  . FLEXIBLE SIGMOIDOSCOPY  07/26/2017   Procedure: FLEXIBLE SIGMOIDOSCOPY;  Surgeon: Ileana Roup, MD;  Location: [facility];  Service: General;;  . FLEXIBLE SIGMOIDOSCOPY N/A 10/19/2017   Procedure: FLEXIBLE SIGMOIDOSCOPY;  Surgeon: Ileana Roup, MD;  Location: [facility];  Service: General;  Laterality: N/A;  . LAPAROSCOPIC LOW ANTERIOR  RESECTION N/A 07/26/2017   Procedure: LAPAROSCOPIC VS OPEN  LOW ANTERIOR RESECTION WITH DIVERITING LOOP ILEOSTOMY;  Surgeon: Ileana Roup, MD;  Location: [facility];  Service: General;  Laterality: N/A;  . PROCTOSCOPY  07/26/2017   Procedure: PROCTOSCOPY;  Surgeon: Ileana Roup, MD;  Location: [facility];  Service: General;;  . TONSILLECTOMY    . TUBAL LIGATION    . WISDOM TOOTH EXTRACTION      Family History  Problem Relation Age of Onset  . Breast cancer Other 56    Social:  reports that she has never smoked. She has never used smokeless tobacco. She reports that she drinks about 0.6 oz of alcohol per week. She reports that she does not use drugs.  Allergies:  Allergies  Allergen Reactions  . Dilaudid [Hydromorphone Hcl] Itching  . Latex Rash and Other (See Comments)    Skin sensitivity     Medications: I have reviewed the patient's current medications.  No results found for this or any previous visit (from the past 48 hour(s)).  No results found.  ROS - all of the below systems have been reviewed with the patient and positives are indicated with bold text General: chills, fever or night sweats Eyes: blurry vision or double vision ENT: epistaxis or sore throat Allergy/Immunology: itchy/watery eyes or nasal congestion Hematologic/Lymphatic: bleeding problems, blood clots or swollen lymph nodes Endocrine: temperature intolerance or unexpected weight changes Breast: new or changing breast lumps or nipple discharge Resp: cough, shortness of breath, or wheezing CV: chest pain or dyspnea on exertion GI: as per HPI GU: dysuria, trouble voiding, or hematuria MSK: joint pain or joint stiffness  Neuro: TIA or stroke symptoms Derm: pruritus and skin lesion changes Psych: anxiety and depression  PE Blood pressure 124/82, pulse 76, temperature 98.2 F (36.8 C), resp. rate 18, height 5\' 6"  (1.676 m), weight 84.4 kg (186 lb), SpO2 98 %. Constitutional: NAD; conversant; no  deformities Eyes: Moist conjunctiva; no lid lag; anicteric; PERRL Neck: Trachea midline; no thyromegaly Lungs: Normal respiratory effort; no tactile fremitus CV: RRR; no palpable thrills; no pitting edema GI: Abd soft, NT/ND; no palpable hepatosplenomegaly; ileostomy in place, stool in appliance MSK: Normal gait; no clubbing/cyanosis Psychiatric: Appropriate affect; alert and oriented x3 Lymphatic: No palpable cervical or axillary lymphadenopathy   A/P: [name] is a pleasant 44F with mid vs low rectal cancer - cT3N0M0 - s/p neoadjuvant chemoXRT completed 05/30/17 - OR 07/26/17 for laparoscopic ultralow anterior resection with diverting loop ileostomy. Final pathology- ypT2N0M0. Did not tolerate adjuvant Xeloda due to neuropathy in her feet and only received 1 cycle on 08/28/17. No further adjuvant therapy planned at this time GGE and flex sig clear. She states she is ready for surgery.  -The anatomy and physiology of the GI tract was again discussed at length today with the patient, her husband and siblings. The pathophysiology of rectal cancer and ileostomies was discussed at length with associated pictures and illustrations. -The planned procedure including potential for additional laparoscopy and/or open surgery, material risks (including but not limited to pain, bleeding, infection, damage to surrounding structures including bowel, leak from anastomoses, need for additional procedures, need for blood transfusion, poor rectal/fecal function postoperitively including incontinence, pelvic pain, hernia, pneumonia, heart attack, stroke, death) benefits and alternatives were discussed. Her and her husband's questions were answered to their satisfaction, they voiced understand and they elected to proceed with surgery. Additionally, we discussed the typical postoperative course, expectations and restrictions.  Donna Brandt, M.D. General and Colorectal Surgery [facility]

## 2017-11-29 LAB — CBC
HEMATOCRIT: 33.8 % — AB (ref 36.0–46.0)
HEMOGLOBIN: 11.2 g/dL — AB (ref 12.0–15.0)
MCH: 25.9 pg — ABNORMAL LOW (ref 26.0–34.0)
MCHC: 33.1 g/dL (ref 30.0–36.0)
MCV: 78.2 fL (ref 78.0–100.0)
Platelets: 198 10*3/uL (ref 150–400)
RBC: 4.32 MIL/uL (ref 3.87–5.11)
RDW: 14.3 % (ref 11.5–15.5)
WBC: 8.1 10*3/uL (ref 4.0–10.5)

## 2017-11-29 LAB — MAGNESIUM: MAGNESIUM: 1.8 mg/dL (ref 1.7–2.4)

## 2017-11-29 LAB — BASIC METABOLIC PANEL
Anion gap: 9 (ref 5–15)
BUN: 9 mg/dL (ref 6–20)
CHLORIDE: 103 mmol/L (ref 101–111)
CO2: 25 mmol/L (ref 22–32)
Calcium: 10.2 mg/dL (ref 8.9–10.3)
Creatinine, Ser: 0.61 mg/dL (ref 0.44–1.00)
GFR calc non Af Amer: >60 mL/min (ref >60)
GLUCOSE: 112 mg/dL — AB (ref 65–99)
Potassium: 4.1 mmol/L (ref 3.5–5.1)
Sodium: 137 mmol/L (ref 135–145)

## 2017-11-29 LAB — PHOSPHORUS: PHOSPHORUS: 3.6 mg/dL (ref 2.5–4.6)

## 2017-11-29 MED ORDER — MAGNESIUM SULFATE 2 GM/50ML IV SOLN
2.0000 g | Freq: Once | INTRAVENOUS | Status: AC
  Administered 2017-11-29: 2 g via INTRAVENOUS
  Filled 2017-11-29: qty 50

## 2017-11-29 NOTE — Progress Notes (Addendum)
Subjective No acute events. Feeling well today. Was sore yesterday but improved today. Denies nausea/vomiting. Tolerating sips of liquids without issue. Ambulating. Foley out and voiding spontaneously. Reports some flatus; denies BM.  Objective: Vital signs in last 24 hours: Temp:  [97.5 F (36.4 C)-98.3 F (36.8 C)] 98.2 F (36.8 C) (04/25 0524) Pulse Rate:  [64-88] 64 (04/25 0524) Resp:  [13-18] 16 (04/25 0524) BP: (113-140)/(72-94) 119/89 (04/25 0524) SpO2:  [98 %-100 %] 100 % (04/25 0524) Weight:  [84.4 kg (186 lb)-90.7 kg (199 lb 15.3 oz)] 90.7 kg (199 lb 15.3 oz) (04/25 0524) Last BM Date: 11/28/17  Intake/Output from previous day: 04/24 0701 - 04/25 0700 In: 2311.3 [I.V.:2311.3] Out: 3500 [Urine:3400; Blood:100]  UOP: 2.2L overnight  Intake/Output this shift: No intake/output data recorded.  Gen: NAD, comfortable, smiling CV: RRR Pulm: Normal work of breathing Abd: Soft, minimally tender to palpation around surgical site, no tenderness elsewhere. Nondistended. No rebound/guarding. Ext: SCDs in place  Lab Results: CBC  Recent Labs    11/29/17 0500  WBC 8.1  HGB 11.2*  HCT 33.8*  PLT 198   BMET Recent Labs    11/29/17 0500  NA 137  K 4.1  CL 103  CO2 25  GLUCOSE 112*  BUN 9  CREATININE 0.61  CALCIUM 10.2   PT/INR No results for input(s): LABPROT, INR in the last 72 hours. ABG No results for input(s): PHART, HCO3 in the last 72 hours.  Invalid input(s): PCO2, PO2  Studies/Results:  Anti-infectives: Anti-infectives (From admission, onward)   Start     Dose/Rate Route Frequency Ordered Stop   11/28/17 1508  valACYclovir (VALTREX) tablet 500 mg     500 mg Oral Daily PRN 11/28/17 1508     11/28/17 0909  cefoTEtan in Dextrose 5% (CEFOTAN) IVPB 2 g     2 g Intravenous On call to O.R. 11/28/17 0909 11/28/17 1129       Assessment/Plan: Patient Active Problem List   Diagnosis Date Noted  . Rectal cancer ([place]) 07/26/2017  . Genetic testing  05/08/2017  . Family history of breast cancer   . 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 Dema Severin, M.D. [facility]  Note: This dictation was prepared with Dragon/digital dictation along with [employer]. Any transcriptional errors that result from this process are unintentional.

## 2017-11-30 LAB — BASIC METABOLIC PANEL
ANION GAP: 9 (ref 5–15)
BUN: 9 mg/dL (ref 6–20)
CALCIUM: 9.9 mg/dL (ref 8.9–10.3)
CO2: 25 mmol/L (ref 22–32)
Chloride: 106 mmol/L (ref 101–111)
Creatinine, Ser: 0.67 mg/dL (ref 0.44–1.00)
Glucose, Bld: 94 mg/dL (ref 65–99)
Potassium: 3.6 mmol/L (ref 3.5–5.1)
Sodium: 140 mmol/L (ref 135–145)

## 2017-11-30 LAB — PHOSPHORUS: PHOSPHORUS: 2.8 mg/dL (ref 2.5–4.6)

## 2017-11-30 LAB — MAGNESIUM: Magnesium: 2.1 mg/dL (ref 1.7–2.4)

## 2017-11-30 MED ORDER — TRAMADOL HCL 50 MG PO TABS: 50.0000 mg | ORAL_TABLET | Freq: Four times a day (QID) | ORAL | 0 refills | Status: AC | PRN

## 2017-11-30 MED ORDER — POTASSIUM CHLORIDE CRYS ER 20 MEQ PO TBCR
40.0000 meq | EXTENDED_RELEASE_TABLET | Freq: Once | ORAL | Status: AC
  Administered 2017-11-30: 40 meq via ORAL
  Filled 2017-11-30: qty 2

## 2017-11-30 NOTE — Discharge Instructions (Addendum)
POST OP INSTRUCTIONS  1. DIET: As tolerated. Follow a light bland diet the first 24 hours after arrival home, such as soup, liquids, crackers, etc.  Be sure to include lots of fluids daily.  Avoid fast food or heavy meals as your are more likely to get nauseated.  Eat a low fat the next few days after surgery.  2. Take your usually prescribed home medications unless otherwise directed.  3. PAIN CONTROL: a. Pain is best controlled by a usual combination of three different methods TOGETHER: i. Ice/Heat ii. Over the counter pain medication iii. Prescription pain medication b. Most patients will experience some swelling and bruising around the surgical site.  Ice packs or heating pads (30-60 minutes up to 6 times a day) will help. Some people prefer to use ice alone, heat alone, alternating between ice & heat.  Experiment to what works for you.  Swelling and bruising can take several weeks to resolve.   c. It is helpful to take an over-the-counter pain medication regularly for the first few weeks: i. Ibuprofen (Motrin/Advil) - 200mg  tabs - take 3 tabs (600mg ) every 6 hours as needed for pain ii. Acetaminophen (Tylenol) - you may take 650mg  every 6 hours as needed. You can take this with motrin as they act differently on the body. If you are taking a narcotic pain medication that has acetaminophen in it, do not take over the counter tylenol at the same time.  Iii. NOTE: You may take both of these medications together - most patients  find it most helpful when alternating between the two (i.e. Ibuprofen at 6am,  tylenol at 9am, ibuprofen at 12pm ...) d. A  prescription for pain medication should be given to you upon discharge.  Take your pain medication as prescribed if your pain is not adequatly controlled with the over-the-counter pain reliefs mentioned above. e. For perianal irritation you may apply topical Calmoseptine (over the counter) which is a barrier ointment that also contains menthol. This  helps significantly in the initial weeks following ileostomy reversal.  4. Avoid getting constipated.  Between the surgery and the pain medications, it is common to experience some constipation.  Increasing fluid intake and taking a fiber supplement (such as Metamucil, Citrucel, FiberCon, MiraLax, etc) 1-2 times a day regularly will usually help prevent this problem from occurring. Also, loose watery stool may be improved by starting a fiber supplement as well. A mild laxative (prune juice, Milk of Magnesia, MiraLax, etc) should be taken according to package directions if there are no bowel movements after 48 hours.    5. Dressing: You may cover your ileostomy site with dry gauze during the day to protect clothing from drainage. You may shower normally with soap/water over wound. Pat dry following. Do not submerge wound under water until fully healed (bath, pool, lake, ocean, etc).  6. ACTIVITIES as tolerated:   a. Avoid heavy lifting (>10lbs or 1 gallon of milk) for the next 6 weeks. b. You may resume regular (light) daily activities beginning the next day--such as daily self-care, walking, climbing stairs--gradually increasing activities as tolerated.  If you can walk 30 minutes without difficulty, it is safe to try more intense activity such as jogging, treadmill, bicycling, low-impact aerobics.  c. DO NOT PUSH THROUGH PAIN.  Let pain be your guide: If it hurts to do something, don't do it. d. Dennis Bast may drive when you are no longer taking prescription pain medication, you can comfortably wear a seatbelt, and you can safely  maneuver your car and apply brakes. e. Dennis Bast may have intercourse when it is comfortable.   7. FOLLOW UP in our office a. Please call [facility] at [phone] to set up an appointment to see your surgeon in the office for a follow-up appointment approximately 2 weeks after your surgery. b. Make sure that you call for this appointment the day you arrive home to insure a convenient  appointment time.  9. If you have disability or family leave forms that need to be completed, you may have them completed by your primary care physician's office; for return to work instructions, please ask our office staff and they will be happy to assist you in obtaining this documentation   When to call us [phone]: 1. Poor pain control 2. Reactions / problems with new medications (rash/itching, etc)  3. Fever over 101.5 F (38.5 C) 4. Inability to urinate 5. Nausea/vomiting 6. Worsening swelling or bruising 7. Continued bleeding from incision. 8. Increased pain, redness, or drainage from the incision  The clinic staff is available to answer your questions during regular business hours (8:30am-5pm).  Please dont hesitate to call and ask to speak to one of our nurses for clinical concerns.   A surgeon from [facility] is always on call at the hospitals   If you have a medical emergency, go to the nearest emergency room or call 911.  [facility], [place] [address], [place], [place], [place]  [address] MAIN: [phone] FAX: [phone] [email or web address]

## 2017-11-30 NOTE — Progress Notes (Signed)
Subjective No acute events. Abdominal wall soreness but otherwise feeling well. Some nausea with full liquids but no emesis. Ambulating. Foley out and voiding spontaneously. Having flatus and multiple BMs.  Objective: Vital signs in last 24 hours: Temp:  [97.9 F (36.6 C)-98.7 F (37.1 C)] 98.1 F (36.7 C) (04/26 0542) Pulse Rate:  [66-71] 66 (04/26 0542) Resp:  [16-18] 18 (04/26 0542) BP: (117-133)/(87-90) 117/89 (04/26 0542) SpO2:  [96 %-100 %] 97 % (04/26 0542) Weight:  [94.1 kg (207 lb 7.3 oz)] 94.1 kg (207 lb 7.3 oz) (04/26 0542) Last BM Date: 11/28/17  Intake/Output from previous day: 04/25 0701 - 04/26 0700 In: 360 [P.O.:360] Out: 1550 [Urine:1550]  UOP: 2.2L overnight  Intake/Output this shift: No intake/output data recorded.  Gen: NAD, comfortable, smiling CV: RRR Pulm: Normal work of breathing Abd: Soft, minimally tender to palpation around surgical site, no tenderness elsewhere. Nondistended. No rebound/guarding. Dressing changed - clean gauze placed over stoma closure site, no packing necessary Ext: SCDs in place  Lab Results: CBC  Recent Labs    11/29/17 0500  WBC 8.1  HGB 11.2*  HCT 33.8*  PLT 198   BMET Recent Labs    11/29/17 0500 11/30/17 0456  NA 137 140  K 4.1 3.6  CL 103 106  CO2 25 25  GLUCOSE 112* 94  BUN 9 9  CREATININE 0.61 0.67  CALCIUM 10.2 9.9    Anti-infectives: Anti-infectives (From admission, onward)   Start     Dose/Rate Route Frequency Ordered Stop   11/28/17 1508  valACYclovir (VALTREX) tablet 500 mg     500 mg Oral Daily PRN 11/28/17 1508     11/28/17 0909  cefoTEtan in Dextrose 5% (CEFOTAN) IVPB 2 g     2 g Intravenous On call to O.R. 11/28/17 0909 11/28/17 1129       Assessment/Plan: Patient Active Problem List   Diagnosis Date Noted  . Rectal cancer ([place]) 07/26/2017  . Genetic testing 05/08/2017  . Family history of breast cancer   . 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 Dema Severin, M.D. [facility]  Note: This dictation was prepared with Dragon/digital dictation along with [employer]. Any transcriptional errors that result from this process are unintentional.

## 2017-12-01 LAB — BASIC METABOLIC PANEL
Anion gap: 8 (ref 5–15)
BUN: 9 mg/dL (ref 6–20)
CHLORIDE: 106 mmol/L (ref 101–111)
CO2: 26 mmol/L (ref 22–32)
CREATININE: 0.52 mg/dL (ref 0.44–1.00)
Calcium: 9.9 mg/dL (ref 8.9–10.3)
GFR calc Af Amer: >60 mL/min (ref >60)
GFR calc non Af Amer: >60 mL/min (ref >60)
Glucose, Bld: 156 mg/dL — ABNORMAL HIGH (ref 65–99)
Potassium: 3.3 mmol/L — ABNORMAL LOW (ref 3.5–5.1)
SODIUM: 140 mmol/L (ref 135–145)

## 2017-12-01 LAB — PHOSPHORUS: Phosphorus: 3 mg/dL (ref 2.5–4.6)

## 2017-12-01 LAB — MAGNESIUM: Magnesium: 1.9 mg/dL (ref 1.7–2.4)

## 2017-12-01 NOTE — Progress Notes (Signed)
Patient ID: Donna Brandt, female   DOB: April 23, 1976, 42 y.o.   MRN: [id number] 3 Days Post-Op   Subjective: States she is having a fair amount of abdominal cramping.  Some mild nausea without vomiting.  Loose bowel movements with some perianal irritation.  Objective: Vital signs in last 24 hours: Temp:  [98.3 F (36.8 C)-98.7 F (37.1 C)] 98.7 F (37.1 C) (04/27 0522) Pulse Rate:  [64-79] 67 (04/27 0522) Resp:  [13-16] 13 (04/27 0522) BP: (119-142)/(85-98) 119/88 (04/27 0522) SpO2:  [98 %-100 %] 98 % (04/27 0522) Weight:  [93.2 kg (205 lb 7.5 oz)] 93.2 kg (205 lb 7.5 oz) (04/27 0522) Last BM Date: 11/30/17  Intake/Output from previous day: 04/26 0701 - 04/27 0700 In: 60 [P.O.:60] Out: -  Intake/Output this shift: No intake/output data recorded.  General appearance: alert, cooperative and no distress GI: Mild distention.  Soft and nontender. Incision/Wound: Dressing clean and dry.  Lab Results:  Recent Labs    11/29/17 0500  WBC 8.1  HGB 11.2*  HCT 33.8*  PLT 198   BMET Recent Labs    11/30/17 0456 12/01/17 0433  NA 140 140  K 3.6 3.3*  CL 106 106  CO2 25 26  GLUCOSE 94 156*  BUN 9 9  CREATININE 0.67 0.52  CALCIUM 9.9 9.9     Studies/Results: No results found.  Anti-infectives: Anti-infectives (From admission, onward)   Start     Dose/Rate Route Frequency Ordered Stop   11/28/17 1508  valACYclovir (VALTREX) tablet 500 mg     500 mg Oral Daily PRN 11/28/17 1508     11/28/17 0909  cefoTEtan in Dextrose 5% (CEFOTAN) IVPB 2 g     2 g Intravenous On call to O.R. 11/28/17 0909 11/28/17 1129      Assessment/Plan: s/p Procedure(s): CLOSURE OF LOOP ILEOSTOMY ERAS PATHWAY Seems to be generally doing well with some resolving ileus.  She is somewhat concerned about going home the way she currently feels.  Would like to try some regular food.  Observe today and possible discharge tomorrow.   LOS: 3 days    Edward Jolly 12/01/2017

## 2017-12-02 LAB — BASIC METABOLIC PANEL
Anion gap: 8 (ref 5–15)
BUN: 8 mg/dL (ref 6–20)
CALCIUM: 10.2 mg/dL (ref 8.9–10.3)
CO2: 25 mmol/L (ref 22–32)
Chloride: 106 mmol/L (ref 101–111)
Creatinine, Ser: 0.52 mg/dL (ref 0.44–1.00)
GFR calc Af Amer: >60 mL/min (ref >60)
GLUCOSE: 110 mg/dL — AB (ref 65–99)
Potassium: 3.8 mmol/L (ref 3.5–5.1)
Sodium: 139 mmol/L (ref 135–145)

## 2017-12-02 LAB — PHOSPHORUS: Phosphorus: 3.7 mg/dL (ref 2.5–4.6)

## 2017-12-02 LAB — MAGNESIUM: Magnesium: 2 mg/dL (ref 1.7–2.4)

## 2017-12-02 MED ORDER — TRAMADOL HCL 50 MG PO TABS: 50.0000 mg | ORAL_TABLET | Freq: Four times a day (QID) | ORAL | 0 refills | Status: DC | PRN

## 2017-12-02 NOTE — Discharge Summary (Signed)
  Patient ID: Donna Brandt [id number] 42 y.o. [birth year]  11/28/2017  Discharge date and time: 12/02/2017   Admitting Physician: Nadeen Landau  Discharge Physician: Edward Jolly  Admission Diagnoses: Rectal cancer, status post diverting ileostomy  Discharge Diagnoses: Same  Operations: Procedure(s): [place]  Admission Condition: good  Discharged Condition: good  Indication for Admission: Patient has a history of rectal cancer, LAR and diverting ileostomy.  She was electively admitted for ileostomy closure  Hospital Course: On the morning of admission patient underwent an uneventful ileostomy closure.  Her hospital course was unremarkable.  She was started on a liquid diet and gradually advanced.  She had some cramping and nausea initially that improved and by the time of discharge was tolerating a regular diet and having bowel movements.  Abdomen is soft and nontender.  Wound sites are clean.    Disposition: Home  Patient Instructions:  Allergies as of 12/02/2017      Reactions   Dilaudid [hydromorphone Hcl] Itching   Latex Rash, Other (See Comments)   Skin sensitivity       Medication List    TAKE these medications   amLODipine 10 MG tablet Commonly known as:  NORVASC Take 10 mg by mouth daily.   [employer] FIBER GUMMIES PO Take 2 each by mouth daily.   pyridOXINE 100 MG tablet Commonly known as:  VITAMIN B-6 Take 100 mg by mouth daily.   sertraline 100 MG tablet Commonly known as:  ZOLOFT Take 100 mg by mouth daily.   SUMAtriptan 100 MG tablet Commonly known as:  IMITREX Take 100 mg by mouth every 2 (two) hours as needed for migraine. May repeat once in 2 hours if needed   traMADol 50 MG tablet Commonly known as:  ULTRAM Take 1 tablet (50 mg total) by mouth every 6 (six) hours as needed for up to 7 days (pain not controlled with tylenol and ibuprofen - postoperative pain).   traMADol 50 MG tablet Commonly known as:   ULTRAM Take 1 tablet (50 mg total) by mouth every 6 (six) hours as needed (pain not controlled with ibuprofen and tylenol).   valACYclovir 500 MG tablet Commonly known as:  VALTREX Take 500 mg by mouth daily as needed (for break out).       Activity: no heavy lifting for 3 weeks Diet: regular diet Wound Care: Instructed to change bandage over ileostomy site daily and as needed  Follow-up:  With Dr. Nadeen Landau in 2 weeks.  Signed: Edward Jolly MD, FACS  12/02/2017, 8:38 AM

## 2017-12-02 NOTE — Progress Notes (Signed)
Patient ID: Donna Brandt, female   DOB: [birth year], 42 y.o.   MRN: [id number] 4 Days Post-Op   Subjective: Feels better today.  Has had bowel movements.  Tolerating about half of a regular diet.  Still having some cramping and occasional mild nausea.  Objective: Vital signs in last 24 hours: Temp:  [98 F (36.7 C)-98.7 F (37.1 C)] 98 F (36.7 C) (04/28 0548) Pulse Rate:  [66-72] 68 (04/28 0548) Resp:  [16-18] 16 (04/28 0548) BP: (114-132)/(81-100) 114/81 (04/28 0548) SpO2:  [98 %-100 %] 99 % (04/28 0548) Weight:  [90.7 kg (199 lb 15.3 oz)] 90.7 kg (199 lb 15.3 oz) (04/28 0548) Last BM Date: 11/30/17  Intake/Output from previous day: 04/27 0701 - 04/28 0700 In: 825 [I.V.:825] Out: -  Intake/Output this shift: No intake/output data recorded.  General appearance: alert, cooperative and no distress GI: Nondistended.  Soft and nontender. Incision/Wound: No erythema or drainage  Lab Results:  No results for input(s): WBC, HGB, HCT, PLT in the last 72 hours. BMET Recent Labs    12/01/17 0433 12/02/17 0434  NA 140 139  K 3.3* 3.8  CL 106 106  CO2 26 25  GLUCOSE 156* 110*  BUN 9 8  CREATININE 0.52 0.52  CALCIUM 9.9 10.2     Studies/Results: No results found.  Anti-infectives: Anti-infectives (From admission, onward)   Start     Dose/Rate Route Frequency Ordered Stop   11/28/17 1508  valACYclovir (VALTREX) tablet 500 mg     500 mg Oral Daily PRN 11/28/17 1508     11/28/17 0909  cefoTEtan in Dextrose 5% (CEFOTAN) IVPB 2 g     2 g Intravenous On call to O.R. 11/28/17 0909 11/28/17 1129      Assessment/Plan: s/p Procedure(s): CLOSURE OF LOOP ILEOSTOMY ERAS PATHWAY Doing well postoperatively.  Okay for discharge.   LOS: 4 days    Edward Jolly 12/02/2017

## 2017-12-02 NOTE — Progress Notes (Signed)
Pt alert and oriented.  D/C instructions and prescription was given. All questions answered. Pt was d/cd home with spouse.

## 2017-12-04 ENCOUNTER — Telehealth: Payer: Self-pay | Admitting: Oncology

## 2017-12-04 ENCOUNTER — Inpatient Hospital Stay: Payer: BLUE CROSS/BLUE SHIELD | Attending: Oncology | Admitting: Oncology

## 2017-12-04 ENCOUNTER — Other Ambulatory Visit: Payer: Self-pay | Admitting: *Deleted

## 2017-12-04 VITALS — BP 115/79 | HR 86 | Temp 99.0°F | Resp 17 | Ht 66.0 in | Wt 188.8 lb

## 2017-12-04 DIAGNOSIS — C20 Malignant neoplasm of rectum: Secondary | ICD-10-CM | POA: Insufficient documentation

## 2017-12-04 DIAGNOSIS — F329 Major depressive disorder, single episode, unspecified: Secondary | ICD-10-CM | POA: Insufficient documentation

## 2017-12-04 DIAGNOSIS — R531 Weakness: Secondary | ICD-10-CM | POA: Diagnosis not present

## 2017-12-04 DIAGNOSIS — G620 Drug-induced polyneuropathy: Secondary | ICD-10-CM

## 2017-12-04 DIAGNOSIS — Z923 Personal history of irradiation: Secondary | ICD-10-CM | POA: Diagnosis not present

## 2017-12-04 DIAGNOSIS — I10 Essential (primary) hypertension: Secondary | ICD-10-CM | POA: Insufficient documentation

## 2017-12-04 MED ORDER — GABAPENTIN 300 MG PO CAPS: 300.0000 mg | ORAL_CAPSULE | Freq: Two times a day (BID) | ORAL | 0 refills | Status: DC

## 2017-12-04 MED ORDER — ONDANSETRON HCL 4 MG PO TABS: 4.0000 mg | ORAL_TABLET | Freq: Three times a day (TID) | ORAL | 0 refills | Status: DC | PRN

## 2017-12-04 NOTE — Progress Notes (Signed)
[facility] OFFICE PROGRESS NOTE   Diagnosis: Rectal cancer  INTERVAL HISTORY:   Donna Brandt returns for a scheduled visit.  She underwent ileostomy takedown on 11/28/2017.  She was discharged from the hospital 12/02/2017. She reports improvement in neuropathy symptoms within approximately 1 week of discontinuing Xeloda.  The numbness of the legs resolved, but she had persistent numbness, tingling, and pain in the feet.  This improved when she was given a dose of Neurontin during the recent hospital admission.  The symptoms have returned since discharge from the hospital.  She continues to have weakness of the legs, but this has improved. She is dressing the abdominal wound daily.  She ports nausea.  Her bowels are moving. Objective:  Vital signs in last 24 hours:  Blood pressure 115/79, pulse 86, temperature 99 F (37.2 C), temperature source Oral, resp. rate 17, height '5\' 6"'$  (1.676 m), weight 188 lb 12.8 oz (85.6 kg), SpO2 100 %.    HEENT: Neck without mass Lymphatics: No cervical, supraclavicular, axillary, or inguinal nodes Resp: Lungs clear bilaterally Cardio: Regular rate and rhythm GI: Gauze dressing covering the right abdomen ileostomy site.  No hepatomegaly Vascular: No leg edema Neuro: Sensation intact to light touch at the lower legs and feet, leg and foot strength intact bilaterally Skin: Palms and soles without erythema    Lab Results:  Lab Results  Component Value Date   WBC 8.1 11/29/2017   HGB 11.2 (L) 11/29/2017   HCT 33.8 (L) 11/29/2017   MCV 78.2 11/29/2017   PLT 198 11/29/2017   NEUTROABS 2.4 11/16/2017    CMP     Component Value Date/Time   NA 139 12/02/2017 0434   NA 143 07/04/2017 0813   K 3.8 12/02/2017 0434   K 4.0 07/04/2017 0813   CL 106 12/02/2017 0434   CO2 25 12/02/2017 0434   CO2 25 07/04/2017 0813   GLUCOSE 110 (H) 12/02/2017 0434   GLUCOSE 106 07/04/2017 0813   BUN 8 12/02/2017 0434   BUN 15.0 07/04/2017 0813   CREATININE 0.52 12/02/2017 0434   CREATININE 0.72 09/06/2017 1454   CREATININE 0.8 07/04/2017 0813   CALCIUM 10.2 12/02/2017 0434   CALCIUM 10.4 07/04/2017 0813   PROT 7.5 11/16/2017 1116   PROT 7.6 07/04/2017 0813   ALBUMIN 4.0 11/16/2017 1116   ALBUMIN 4.0 07/04/2017 0813   AST 21 11/16/2017 1116   AST 15 09/06/2017 1454   AST 18 07/04/2017 0813   ALT 23 11/16/2017 1116   ALT 14 09/06/2017 1454   ALT 25 07/04/2017 0813   ALKPHOS 114 11/16/2017 1116   ALKPHOS 90 07/04/2017 0813   BILITOT 0.4 11/16/2017 1116   BILITOT 0.3 09/06/2017 1454   BILITOT 0.40 07/04/2017 0813   GFRNONAA >60 12/02/2017 0434   GFRNONAA >60 09/06/2017 1454   GFRAA >60 12/02/2017 0434   GFRAA >60 09/06/2017 1454    Lab Results  Component Value Date   CEA1 <1.00 08/20/2017     Medications: I have reviewed the patient's current medications.   Assessment/Plan: 1. Rectal cancer, clinical stage T3b,N0,M0  colonoscopy 03/06/2017-biopsy of a rectal mass revealed fragments of an adenomatous lesion with high-grade dysplasia, definite submucosa not available to evaluate for invasion  Proctoscopy/biopsy 03/23/2017 confirmed a mass at 6-7 centimeters from the anal verge, biopsy revealed polypoid colorectal mucosa with detached fragments of adenomatous mucosa  Staging CTs 03/16/2017, anterior rectal mass, no evidence of metastatic disease, no adenopathy,  MRI 03/21/2017-T3b,N0rectal tumor  Initiationof radiation and  concurrent Xeloda 04/23/2017.Completed 05/30/2017.  Laparoscopic low anterior resection and diverting ileostomy 07/26/2017,ypT2,ypN0tumor with negative surgical margins, normal mismatch repair protein expression  Cycle 1 adjuvant Xeloda 08/20/2017  Xeloda placed on hold beginning 08/28/2017 due to bilateral leg numbness  Xeloda discontinued  2. Hypertension  3. 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   Bilateral foot/leg numbness and weakness.  MRI  lumbar spine 09/05/2017-no evidence of metastatic disease to the lumbosacral spine.  No stenosis or neural compression.  Fatty marrow changes from mid L5 through the sacrum presumably secondary to previous radiation.  Leg strength improved on exam 09/06/2017.  Per patient report numbness improved, still present over the soles of both feet.  6.   Ileostomy reversal 11/28/2017  Disposition: Donna Brandt is in clinical remission from rectal cancer.  She is recovering from the ileostomy reversal procedure. 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  This may be secondary to Xeloda.  She reports obtaining relief when she received gabapentin while in the hospital.  We prescribed gabapentin today.  We reviewed potential toxicities associated with gabapentin including the chance of somnolence.  She agrees to proceed.  She will begin twice daily gabapentin.  She will contact us within the next week if this is not helpful.  We gave her a prescription for Zofran to use as needed for nausea.  She will return for an office visit and CEA in approximately 2 months.  15 minutes were spent with the patient today.  The majority of the time was used for counseling and coordination of care.  Betsy Coder, MD  12/04/2017  4:23 PM

## 2017-12-04 NOTE — Telephone Encounter (Signed)
Scheduled appt per 4/30 los - Gave patient AVS and calender per los.  

## 2017-12-06 ENCOUNTER — Ambulatory Visit: Payer: BLUE CROSS/BLUE SHIELD | Admitting: Oncology

## 2017-12-28 ENCOUNTER — Telehealth: Payer: Self-pay

## 2017-12-28 NOTE — Telephone Encounter (Signed)
Returned call to pt regarding memory issues. Pt states shes been having "issues with my memory, and someone mentioned it could be chemo brain". Per MD, current treatment not indicated to cause memory issues, gabapentin can cause drowsiness. Pt reports "the gabapentin makes me very loopy and doesn't help". Per MD pt to stop gabapentin and follow up with PCP regarding memory problems. Pt voiced understanding.

## 2018-01-24 ENCOUNTER — Telehealth: Payer: Self-pay

## 2018-01-24 NOTE — Telephone Encounter (Signed)
Returned call to pt regarding handicap placard. Ok per Dr. Benay Spice. LVM to inform pt that application for placard will be available for pickup in office. Pt to complete and turn in to local [employer]. Provided call back number for any questions or concerns

## 2018-02-04 ENCOUNTER — Telehealth: Payer: Self-pay | Admitting: Nurse Practitioner

## 2018-02-04 ENCOUNTER — Encounter: Payer: Self-pay | Admitting: Neurology

## 2018-02-04 ENCOUNTER — Inpatient Hospital Stay: Payer: BLUE CROSS/BLUE SHIELD

## 2018-02-04 ENCOUNTER — Telehealth: Payer: Self-pay | Admitting: Gastroenterology

## 2018-02-04 ENCOUNTER — Inpatient Hospital Stay: Payer: BLUE CROSS/BLUE SHIELD | Attending: Oncology | Admitting: Nurse Practitioner

## 2018-02-04 ENCOUNTER — Encounter: Payer: Self-pay | Admitting: Nurse Practitioner

## 2018-02-04 VITALS — BP 116/89 | HR 71 | Temp 98.7°F | Resp 17 | Ht 66.0 in | Wt 183.1 lb

## 2018-02-04 DIAGNOSIS — R200 Anesthesia of skin: Secondary | ICD-10-CM

## 2018-02-04 DIAGNOSIS — F329 Major depressive disorder, single episode, unspecified: Secondary | ICD-10-CM | POA: Insufficient documentation

## 2018-02-04 DIAGNOSIS — Z923 Personal history of irradiation: Secondary | ICD-10-CM | POA: Insufficient documentation

## 2018-02-04 DIAGNOSIS — L271 Localized skin eruption due to drugs and medicaments taken internally: Secondary | ICD-10-CM

## 2018-02-04 DIAGNOSIS — C20 Malignant neoplasm of rectum: Secondary | ICD-10-CM

## 2018-02-04 DIAGNOSIS — R531 Weakness: Secondary | ICD-10-CM | POA: Diagnosis not present

## 2018-02-04 DIAGNOSIS — C7951 Secondary malignant neoplasm of bone: Secondary | ICD-10-CM | POA: Insufficient documentation

## 2018-02-04 DIAGNOSIS — I10 Essential (primary) hypertension: Secondary | ICD-10-CM | POA: Insufficient documentation

## 2018-02-04 LAB — CEA (IN HOUSE-CHCC)

## 2018-02-04 NOTE — Telephone Encounter (Signed)
Recall colonoscopy should be 07/2018 for personal history of CRC, surgery was 07/2017. Index colonoscopy was done elsewhere  thanks

## 2018-02-04 NOTE — Telephone Encounter (Signed)
Scheduled appt per 7/1 los - gave patient AVS and calender per los. - referrals into [facility]

## 2018-02-04 NOTE — Progress Notes (Signed)
  Oncology Nurse Navigator Documentation  Navigator Location: [facility] (02/04/18 1012)   )                              Interventions: Referrals (02/04/18 1012) Referrals: Other(Physical Therapy for erratic bowel habits) (02/04/18 1012)          Acuity: Level 1 (02/04/18 1012)         Time Spent with Patient: 15 (02/04/18 1012)

## 2018-02-04 NOTE — Progress Notes (Addendum)
[facility] OFFICE PROGRESS NOTE   Diagnosis: Rectal cancer  INTERVAL HISTORY:   Donna Brandt returns as scheduled.  She continues to have numbness in both feet.  The numbness is intermittently painful.  No numbness in the hands.  She has periodic weakness especially of the right leg.  She has noted increased memory issues since the ileostomy reversal.  She has erratic bowel habits.  This has been ongoing since surgery.  Objective:  Vital signs in last 24 hours:  Blood pressure 116/89, pulse 71, temperature 98.7 F (37.1 C), temperature source Oral, resp. rate 17, height _0  (1.676 m), weight 183 lb 1.6 oz (83.1 kg), SpO2 100 %.    HEENT: Neck without mass. Lymphatics: No palpable cervical, supraclavicular, axillary or inguinal lymph nodes. Resp: Lungs clear bilaterally. Cardio: Regular rate and rhythm. GI: Abdomen soft and nontender.  No hepatomegaly. Vascular: No leg edema. Neuro: Alert and oriented.  Question mild weakness at both feet with dorsiflexion.  Motor strength otherwise intact.   Lab Results:  Lab Results  Component Value Date   WBC 8.1 11/29/2017   HGB 11.2 (L) 11/29/2017   HCT 33.8 (L) 11/29/2017   MCV 78.2 11/29/2017   PLT 198 11/29/2017   NEUTROABS 2.4 11/16/2017    Imaging:  No results found.  Medications: I have reviewed the patient's current medications.  Assessment/Plan: 1. Rectal cancer, clinical stage T3b,N0,M0  colonoscopy 03/06/2017-biopsy of a rectal mass revealed fragments of an adenomatous lesion with high-grade dysplasia, definite submucosa not available to evaluate for invasion  Proctoscopy/biopsy 03/23/2017 confirmed a mass at 6-7 centimeters from the anal verge, biopsy revealed polypoid colorectal mucosa with detached fragments of adenomatous mucosa  Staging CTs 03/16/2017, anterior rectal mass, no evidence of metastatic disease, no adenopathy,  MRI 03/21/2017-T3b,N0rectal tumor  Initiationof radiation and  concurrent Xeloda 04/23/2017.Completed 05/30/2017.  Laparoscopic low anterior resection and diverting ileostomy 07/26/2017,ypT2,ypN0tumor with negative surgical margins, normal mismatch repair protein expression  Cycle 1 adjuvant Xeloda 08/20/2017  Xeloda placed on hold beginning 08/28/2017 due to bilateral leg numbness  Xeloda discontinued  Flexible sigmoidoscopy 10/19/2017- patent end-to-end colocolonic anastomosis characterized by healthy-appearing mucosa.  No specimens collected.  2. Hypertension  3. 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 MRI lumbar spine 09/05/2017-no evidence of metastatic disease to the lumbosacral spine. No stenosis or neural compression. Fatty marrow changes from mid L5 through the sacrum presumably secondary to previous radiation.Leg strength improved on exam 09/06/2017. Per patient report numbness improved, still present over the soles of both feet.  Referral made to neurology 02/04/2018 for evaluation of persistent foot numbness and memory issues.  6.   Ileostomy reversal 11/28/2017     Disposition: Donna Brandt remains in clinical remission from rectal cancer.  We will follow-up on the CEA from today.  We made a referral to Dr. Ardis Hughs to determine timing of a colonoscopy.  She has persistent numbness in both feet and has noted a decline in memory.  We made a referral to neurology.  It would be unusual for Xeloda to cause these symptoms.  Referral also made to pelvic physical therapy due to erratic bowel habits.  She will return for a CEA and follow-up in 3 months.  She will contact the office in the interim with any problems.  Plan reviewed with Dr. Benay Spice.  25 minutes were spent face-to-face at today's visit with the majority of that time involved in counseling/coordination of care.    Ned Card ANP/GNP-BC   02/04/2018  9:37 AM

## 2018-02-04 NOTE — Telephone Encounter (Signed)
Dr Jacobs please advise  

## 2018-02-05 ENCOUNTER — Telehealth: Payer: Self-pay | Admitting: Emergency Medicine

## 2018-02-05 ENCOUNTER — Ambulatory Visit: Payer: BLUE CROSS/BLUE SHIELD | Admitting: Physical Therapy

## 2018-02-05 NOTE — Telephone Encounter (Addendum)
Pt verbalized understanding   ----- Message from Owens Shark, NP sent at 02/04/2018  3:37 PM EDT ----- Please let her know CEA is normal.  Follow-up as scheduled.

## 2018-02-08 NOTE — Telephone Encounter (Signed)
Ned Card has been notified of this. Will call patient when December calendar is available.

## 2018-02-11 ENCOUNTER — Ambulatory Visit: Payer: BLUE CROSS/BLUE SHIELD | Attending: Nurse Practitioner | Admitting: Physical Therapy

## 2018-02-11 ENCOUNTER — Other Ambulatory Visit: Payer: Self-pay

## 2018-02-11 DIAGNOSIS — M6281 Muscle weakness (generalized): Secondary | ICD-10-CM | POA: Diagnosis present

## 2018-02-11 DIAGNOSIS — M62838 Other muscle spasm: Secondary | ICD-10-CM | POA: Diagnosis present

## 2018-02-11 DIAGNOSIS — R278 Other lack of coordination: Secondary | ICD-10-CM | POA: Diagnosis present

## 2018-02-11 NOTE — Patient Instructions (Signed)
Toileting Techniques for Bowel Movements (Defecation) Using your belly (abdomen) and pelvic floor muscles to have a bowel movement is usually instinctive.  Sometimes people can have problems with these muscles and have to relearn proper defecation (emptying) techniques.  If you have weakness in your muscles, organs that are falling out, decreased sensation in your pelvis, or ignore your urge to go, you may find yourself straining to have a bowel movement.  You are straining if you are: . holding your breath or taking in a huge gulp of air and holding it  . keeping your lips and jaw tensed and closed tightly . turning red in the face because of excessive pushing or forcing . developing or worsening your  hemorrhoids . getting faint while pushing . not emptying completely and have to defecate many times a day  If you are straining, you are actually making it harder for yourself to have a bowel movement.  Many people find they are pulling up with the pelvic floor muscles and closing off instead of opening the anus. Due to lack pelvic floor relaxation and coordination the abdominal muscles, one has to work harder to push the feces out.  Many people have never been taught how to defecate efficiently and effectively.  Notice what happens to your body when you are having a bowel movement.  While you are sitting on the toilet pay attention to the following areas: . Jaw and mouth position . Angle of your hips   . Whether your feet touch the ground or not . Arm placement  . Spine position . Waist . Belly tension . Anus (opening of the anal canal)  An Evacuation/Defecation Plan   Here are the 4 basic points:  1. Lean forward enough for your elbows to rest on your knees 2. Support your feet on the floor or use a low stool if your feet don't touch the floor  3. Push out your belly as if you have swallowed a beach ball-you should feel a widening of your waist 4. Open and relax your pelvic floor muscles,  rather than tightening around the anus      The following conditions my require modifications to your toileting posture:  . If you have had surgery in the past that limits your back, hip, pelvic, knee or ankle flexibility . Constipation   Your healthcare practitioner may make the following additional suggestions and adjustments:  1) Sit on the toilet  a) Make sure your feet are supported. b) Notice your hip angle and spine position-most people find it effective to lean forward or raise their knees, which can help the muscles around the anus to relax  c) When you lean forward, place your forearms on your thighs for support  2) Relax suggestions a) Breath deeply in through your nose and out slowly through your mouth as if you are smelling the flowers and blowing out the candles. b) To become aware of how to relax your muscles, contracting and releasing muscles can be helpful.  Pull your pelvic floor muscles in tightly by using the image of holding back gas, or closing around the anus (visualize making a circle smaller) and lifting the anus up and in.  Then release the muscles and your anus should drop down and feel open. Repeat 5 times ending with the feeling of relaxation. c) Keep your pelvic floor muscles relaxed; let your belly bulge out. d) The digestive tract starts at the mouth and ends at the anal opening, so be   sure to relax both ends of the tube.  Place your tongue on the roof of your mouth with your teeth separated.  This helps relax your mouth and will help to relax the anus at the same time.  3) Empty (defecation) a) Keep your pelvic floor and sphincter relaxed, then bulge your anal muscles.  Make the anal opening wide.  b) Stick your belly out as if you have swallowed a beach ball. c) Make your belly wall hard using your belly muscles while continuing to breathe. Doing this makes it easier to open your anus. d) Breath out and give a grunt (or try using other sounds such as  ahhhh, shhhhh, ohhhh or grrrrrrr).  4) Finish a) As you finish your bowel movement, pull the pelvic floor muscles up and in.  This will leave your anus in the proper place rather than remaining pushed out and down. If you leave your anus pushed out and down, it will start to feel as though that is normal and give you incorrect signals about needing to have a bowel movement.     [facility] [address] [place], [place] [address] PROTOCOL FOR DILATORS   1. Wash dilator with soap and water prior to insertion.    2. Lay on your back reclined. Knees are to be up and apart while on your bed or in the bathtub with warm water.   3. Lubricate the end of the dilator with a water-soluble lubricant.  4. Separate the labia.   5. Tense the pelvic floor muscles than relax; while relaxing, slide lubricated dilator into the vagina/rectum   6. Tense muscles again while holding the dilator so it does not get pushed out; relax and slide it in a little further.   7. Try blowing out as if filling a balloon; this may relax the muscles and allow penetration.  Repeat blowing out to insert dilator further.  8. Keep dilator in for 10 minutes if tolerate, with the pelvic floor muscles relaxed to further stretch the canal.   9. Never force the dilator into the canal.  10. 1-2 times per day  Dilator can be purchased on [employer] or [email or web address]

## 2018-02-11 NOTE — Therapy (Signed)
[facility] [address], [place], [place], [address] Phone: [phone]   Fax:  [phone]  Physical Therapy Treatment  Patient Details  Name: Donna Brandt MRN: [id number] Date of Birth: 1975/08/28 Referring Provider: Owens Shark   Encounter Date: 02/11/2018  PT End of Session - 02/11/18 1149    Visit Number  1    Number of Visits  1    Date for PT Re-Evaluation  04/08/18    Authorization Type  Medicaid    PT Start Time  1150    PT Stop Time  1231    PT Time Calculation (min)  41 min       Past Medical History:  Diagnosis Date  . Anxiety   . Colon cancer ([place])    rectal  . Complication of anesthesia    during colonoscopy and wisdom teeth extraction  . Family history of breast cancer   . HTN (hypertension)   . Migraine     Past Surgical History:  Procedure Laterality Date  . ABDOMINAL HYSTERECTOMY     due to uterine fibroids  . AUGMENTATION MAMMAPLASTY Bilateral   . FLEXIBLE SIGMOIDOSCOPY N/A 04/12/2017   Procedure: FLEXIBLE SIGMOIDOSCOPY;  Surgeon: Milus Banister, MD;  Location: [facility];  Service: Endoscopy;  Laterality: N/A;  . FLEXIBLE SIGMOIDOSCOPY N/A 07/18/2017   Procedure: FLEXIBLE SIGMOIDOSCOPY EXAM UNDER ANESTHESIA;  Surgeon: Ileana Roup, MD;  Location: [facility];  Service: General;  Laterality: N/A;  . FLEXIBLE SIGMOIDOSCOPY  07/26/2017   Procedure: FLEXIBLE SIGMOIDOSCOPY;  Surgeon: Ileana Roup, MD;  Location: [facility];  Service: General;;  . FLEXIBLE SIGMOIDOSCOPY N/A 10/19/2017   Procedure: FLEXIBLE SIGMOIDOSCOPY;  Surgeon: Ileana Roup, MD;  Location: [facility];  Service: General;  Laterality: N/A;  . ILEOSTOMY N/A 11/28/2017   Procedure: CLOSURE OF LOOP ILEOSTOMY ERAS PATHWAY;  Surgeon: Ileana Roup, MD;  Location: [facility];  Service: General;  Laterality: N/A;  . LAPAROSCOPIC LOW ANTERIOR RESECTION N/A 07/26/2017   Procedure: LAPAROSCOPIC VS  OPEN  LOW ANTERIOR RESECTION WITH DIVERITING LOOP ILEOSTOMY;  Surgeon: Ileana Roup, MD;  Location: [facility];  Service: General;  Laterality: N/A;  . PROCTOSCOPY  07/26/2017   Procedure: PROCTOSCOPY;  Surgeon: Ileana Roup, MD;  Location: [facility];  Service: General;;  . TONSILLECTOMY    . TUBAL LIGATION    . WISDOM TOOTH EXTRACTION      There were no vitals filed for this visit.  Subjective Assessment - 02/11/18 1152    Subjective  Pt states she is having really bad gas pain and not sure what it is.  States she has BM urgency and long skinny BM.  It feels a lot bigger.  diameter of stool is a little smaller than a dime.  Pt having neuropothy Lt>Rt on the foot, Rt leg is worse and weakness    Limitations  Walking    Patient Stated Goals  have control of BM    Currently in Pain?  Yes every time before gas and BM    Pain Score  10-Worst pain ever    Pain Location  Rectum    Pain Orientation  Mid;Lower    Pain Descriptors / Indicators  Sharp;Shooting    Pain Type  Surgical pain    Pain Onset  More than a month ago    Pain Frequency  Intermittent    Aggravating Factors   gas or stool passing through anus, walking    Effect  of Pain on Daily Activities  work, daily activities         [facility] PT Assessment - 02/11/18 0001      Assessment   Medical Diagnosis  C20 (ICD-10-CM) - Rectal cancer [facility])    Referring Provider  Ned Card K    Onset Date/Surgical Date  11/28/17 11/28/17 ileostomy reversal    Prior Therapy  No      Precautions   Precautions  None      Restrictions   Weight Bearing Restrictions  No      Balance Screen   Has the patient fallen in the past 6 months  Yes    How many times?  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      Cognition    Overall Cognitive Status  Within Functional Limits for tasks assessed      Sensation   Light Touch  Impaired by gross assessment left foot and calf not sensative to light touch      Posture/Postural Control   Posture/Postural Control  Postural limitations    Postural Limitations  Anterior pelvic tilt;Rounded Shoulders;Flexed trunk      ROM / Strength   AROM / PROM / Strength  Strength;PROM;AROM      AROM   Overall AROM Comments  lumbar flexion 60%      PROM   Overall PROM Comments  right hip ER 25% limited;     PROM Assessment Site  Hip    Right/Left Hip  Right;Left      Strength   Strength Assessment Site  Hip    Right/Left Hip  Right;Left    Right Hip Flexion  4-/5    Right Hip Extension  4-/5    Right Hip External Rotation   4-/5    Right Hip Internal Rotation  4-/5    Right Hip ABduction  4-/5    Right Hip ADduction  3+/5    Left Hip Flexion  4/5    Left Hip Extension  4/5    Left Hip External Rotation  4/5    Left Hip Internal Rotation  4/5    Left Hip ABduction  4/5    Left Hip ADduction  4/5      Flexibility   Soft Tissue Assessment /Muscle Length  yes    Hamstrings  right h/s 75%; left h/s 85%      Special Tests    Special Tests  Sacrolliac Tests    Sacroiliac Tests   Pelvic Compression      Pelvic Compression   Findings  Positive    Side  Left    comment  able to perform SLR more easily with compression      Ambulation/Gait   Gait Pattern  Within Functional Limits      Balance   Balance Assessed  Yes      High Level Balance   High Level Balance Comments  single leg stand Rt <2 sec; Lt 2 sec                Pelvic Floor Special Questions - 02/11/18 0001    Prior Pelvic/Prostate Exam  Yes    Are you Pregnant or attempting pregnancy?  No  Prior Pregnancies  Yes    Number of Pregnancies  3    Number of Vaginal Deliveries  3    Currently Sexually Active  Yes    Is this Painful  Yes    Marinoff Scale  pain prevents any attempts at  intercourse just uncomfortable    Urinary Leakage  No    Urinary frequency  No BM - 1 x every other day    Skin Integrity  Intact;Irritaion present at    Skin Integrity Irritation Present at  anus    Pelvic Floor Internal Exam  pt informed and consent given to perform internal assessment    Exam Type  Vaginal    Palpation  very sensative to palpation especially stretch to puborectalis    Strength  Flicker                PT Education - 02/11/18 1227    Education Details  toilet techniques    Person(s) Educated  Patient    Methods  Explanation;Handout    Comprehension  Verbalized understanding          PT Long Term Goals - 02/11/18 1750      PT LONG TERM GOAL #1   Title  ind with advanced HEP for self management of symptoms    Time  8    Period  Weeks    Status  New    Target Date  04/08/18      PT LONG TERM GOAL #2   Title  Pt able to have complete BM with diameter of stool at least quarter size around    Time  8    Period  Weeks    Status  New    Target Date  04/08/18      PT LONG TERM GOAL #3   Title  pt able to have less frequent BM, 3 at most per day, due to improved pelvic floor coordination    Baseline  10 or more on days that she has a BM which is every other day    Time  8    Period  Weeks    Status  New    Target Date  04/08/18      PT LONG TERM GOAL #4   Title  Pt will demonstrate single leg stand of at least 10 seconds on each side due to improved LE strength and balance    Time  8    Period  Weeks    Status  New    Target Date  04/08/18      PT LONG TERM GOAL #5   Title  pt will report 60% less pain with gas or bowel movement    Time  8    Period  Weeks    Status  New    Target Date  04/08/18            Plan - 02/11/18 1739    Clinical Impression Statement  Pt presents to clinic due to inability to control gas and bowel movements since finishing cancer treatment and having ileostomy reversal surgery.  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  Balance is compromised as mentioned above.  She has significant weakness of LE and core with right worse than left side.  Pt is having severe pain with BM or gas as well as dyspareunia. Pt demonstrates decreased hip mobility and tiight hamstrings.  She has stiffness of muscle tissue of pelvic  floor including anal sphincters and puborectalis.  She is unable to tolerate further palpation into pelvic floor due to pain and soft tissue sensativity.  Pt demonstrates decreased muscle coordination and contracts pelvic floor when attempting to evacuate.  Pt has weakness 1/5 MMT of pelvic floor.  She demonstrates posture limitations as mentioned above.  Patient will benefit from skilled PT to address these impairments.     History and Personal Factors relevant to plan of care:  history of cancer, radiation, 3 vaginal deliveries    Clinical Presentation  Unstable    Clinical Presentation due to:  pt is unstable due to history of radiation and cancer    Clinical Decision Making  Moderate    Rehab Potential  Good    Clinical Impairments Affecting Rehab Potential  history of radiation, peripheral neuropathy    PT Frequency  2x / week    PT Duration  8 weeks    PT Treatment/Interventions  ADLs/Self Care Home Management;Biofeedback;Cryotherapy;Electrical Stimulation;Moist Heat;Therapeutic exercise;Therapeutic activities;Gait training;Neuromuscular re-education;Patient/family education;Manual techniques;Passive range of motion;Dry needling;Energy conservation    PT Next Visit Plan  biofeedback, toileting techniques, review dilators and self massage    Recommended Other Services  eval 02/11/18    Consulted and Agree with Plan of Care  Patient       Patient will benefit from skilled therapeutic intervention in order to improve the following deficits and impairments:  Pain, Increased muscle spasms, Decreased strength, Decreased range of motion, Impaired tone, Postural  dysfunction, Decreased skin integrity, Impaired flexibility, Decreased coordination  Visit Diagnosis: Other muscle spasm  Other lack of coordination  Muscle weakness (generalized)     Problem List Patient Active Problem List   Diagnosis Date Noted  . Rectal cancer ([place]) 07/26/2017  . Genetic testing 05/08/2017  . Family history of breast cancer   . 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 [place], [place], [address] Phone: [phone]   Fax:  [phone]  Name: Donna Brandt MRN: [id number] Date of Birth: 1975-09-28

## 2018-02-21 ENCOUNTER — Ambulatory Visit: Payer: BLUE CROSS/BLUE SHIELD | Admitting: Physical Therapy

## 2018-02-21 ENCOUNTER — Encounter: Payer: Self-pay | Admitting: Physical Therapy

## 2018-02-21 DIAGNOSIS — M62838 Other muscle spasm: Secondary | ICD-10-CM | POA: Diagnosis not present

## 2018-02-21 DIAGNOSIS — M6281 Muscle weakness (generalized): Secondary | ICD-10-CM

## 2018-02-21 DIAGNOSIS — R278 Other lack of coordination: Secondary | ICD-10-CM

## 2018-02-21 NOTE — Patient Instructions (Addendum)
Toileting Techniques for Bowel Movements (Defecation) Using your belly (abdomen) and pelvic floor muscles to have a bowel movement is usually instinctive.  Sometimes people can have problems with these muscles and have to relearn proper defecation (emptying) techniques.  If you have weakness in your muscles, organs that are falling out, decreased sensation in your pelvis, or ignore your urge to go, you may find yourself straining to have a bowel movement.  You are straining if you are: . holding your breath or taking in a huge gulp of air and holding it  . keeping your lips and jaw tensed and closed tightly . turning red in the face because of excessive pushing or forcing . developing or worsening your  hemorrhoids . getting faint while pushing . not emptying completely and have to defecate many times a day  If you are straining, you are actually making it harder for yourself to have a bowel movement.  Many people find they are pulling up with the pelvic floor muscles and closing off instead of opening the anus. Due to lack pelvic floor relaxation and coordination the abdominal muscles, one has to work harder to push the feces out.  Many people have never been taught how to defecate efficiently and effectively.  Notice what happens to your body when you are having a bowel movement.  While you are sitting on the toilet pay attention to the following areas: . Jaw and mouth position . Angle of your hips   . Whether your feet touch the ground or not . Arm placement  . Spine position . Waist . Belly tension . Anus (opening of the anal canal)  An Evacuation/Defecation Plan   Here are the 4 basic points:  1. Lean forward enough for your elbows to rest on your knees 2. Support your feet on the floor or use a low stool if your feet don't touch the floor  3. Push out your belly as if you have swallowed a beach ball-you should feel a widening of your waist 4. Open and relax your pelvic floor muscles,  rather than tightening around the anus      The following conditions my require modifications to your toileting posture:  . If you have had surgery in the past that limits your back, hip, pelvic, knee or ankle flexibility . Constipation   Your healthcare practitioner may make the following additional suggestions and adjustments:  1) Sit on the toilet  a) Make sure your feet are supported. b) Notice your hip angle and spine position-most people find it effective to lean forward or raise their knees, which can help the muscles around the anus to relax  c) When you lean forward, place your forearms on your thighs for support  2) Relax suggestions a) Breath deeply in through your nose and out slowly through your mouth as if you are smelling the flowers and blowing out the candles. b) To become aware of how to relax your muscles, contracting and releasing muscles can be helpful.  Pull your pelvic floor muscles in tightly by using the image of holding back gas, or closing around the anus (visualize making a circle smaller) and lifting the anus up and in.  Then release the muscles and your anus should drop down and feel open. Repeat 5 times ending with the feeling of relaxation. c) Keep your pelvic floor muscles relaxed; let your belly bulge out. d) The digestive tract starts at the mouth and ends at the anal opening, so be   sure to relax both ends of the tube.  Place your tongue on the roof of your mouth with your teeth separated.  This helps relax your mouth and will help to relax the anus at the same time.  3) Empty (defecation) a) Keep your pelvic floor and sphincter relaxed, then bulge your anal muscles.  Make the anal opening wide.  b) Stick your belly out as if you have swallowed a beach ball. c) Make your belly wall hard using your belly muscles while continuing to breathe. Doing this makes it easier to open your anus. d) Breath out and give a grunt (or try using other sounds such as  ahhhh, shhhhh, ohhhh or grrrrrrr).  4) Finish a) As you finish your bowel movement, pull the pelvic floor muscles up and in.  This will leave your anus in the proper place rather than remaining pushed out and down. If you leave your anus pushed out and down, it will start to feel as though that is normal and give you incorrect signals about needing to have a bowel movement.   [facility] [address] [place], [place] [address]  

## 2018-02-21 NOTE — Therapy (Addendum)
[facility] [address] [place], [place], [address] Phone: [phone]   Fax:  [phone]  Physical Therapy Treatment  Patient Details  Name: Donna Brandt MRN: [id number] Date of Birth: January 17, 1976 Referring Provider: Owens Shark   Encounter Date: 02/21/2018  PT End of Session - 02/21/18 0856    Visit Number  2    Number of Visits  4    Date for PT Re-Evaluation  04/08/18    Authorization Type  Medicaid    PT Start Time  0851    PT Stop Time  0930    PT Time Calculation (min)  39 min    Activity Tolerance  Patient tolerated treatment well       Past Medical History:  Diagnosis Date  . Anxiety   . Colon cancer ([place])    rectal  . Complication of anesthesia    during colonoscopy and wisdom teeth extraction  . Family history of breast cancer   . HTN (hypertension)   . Migraine     Past Surgical History:  Procedure Laterality Date  . ABDOMINAL HYSTERECTOMY     due to uterine fibroids  . AUGMENTATION MAMMAPLASTY Bilateral   . FLEXIBLE SIGMOIDOSCOPY N/A 04/12/2017   Procedure: FLEXIBLE SIGMOIDOSCOPY;  Surgeon: Milus Banister, MD;  Location: [facility];  Service: Endoscopy;  Laterality: N/A;  . FLEXIBLE SIGMOIDOSCOPY N/A 07/18/2017   Procedure: FLEXIBLE SIGMOIDOSCOPY EXAM UNDER ANESTHESIA;  Surgeon: Ileana Roup, MD;  Location: [facility];  Service: General;  Laterality: N/A;  . FLEXIBLE SIGMOIDOSCOPY  07/26/2017   Procedure: FLEXIBLE SIGMOIDOSCOPY;  Surgeon: Ileana Roup, MD;  Location: [facility];  Service: General;;  . FLEXIBLE SIGMOIDOSCOPY N/A 10/19/2017   Procedure: FLEXIBLE SIGMOIDOSCOPY;  Surgeon: Ileana Roup, MD;  Location: [facility];  Service: General;  Laterality: N/A;  . ILEOSTOMY N/A 11/28/2017   Procedure: CLOSURE OF LOOP ILEOSTOMY ERAS PATHWAY;  Surgeon: Ileana Roup, MD;  Location: [facility];  Service: General;  Laterality: N/A;  . LAPAROSCOPIC LOW ANTERIOR  RESECTION N/A 07/26/2017   Procedure: LAPAROSCOPIC VS OPEN  LOW ANTERIOR RESECTION WITH DIVERITING LOOP ILEOSTOMY;  Surgeon: Ileana Roup, MD;  Location: [facility];  Service: General;  Laterality: N/A;  . PROCTOSCOPY  07/26/2017   Procedure: PROCTOSCOPY;  Surgeon: Ileana Roup, MD;  Location: [facility];  Service: General;;  . TONSILLECTOMY    . TUBAL LIGATION    . WISDOM TOOTH EXTRACTION      There were no vitals filed for this visit.                    [facility] Adult PT Treatment/Exercise - 02/21/18 0001      Self-Care   Self-Care  Other Self-Care Comments    Other Self-Care Comments   toileting techniques      Neuro Re-ed    Neuro Re-ed Details   tactile cues for relax and bulge with diaphragmatic breathing; contrac and relax with tactile cues      Manual Therapy   Manual Therapy  Internal Pelvic Floor    Manual therapy comments  pt informed and consent given to perform internal STM    Internal Pelvic Floor  anal sphincters and puborectalis                  PT Long Term Goals - 02/21/18 1352      PT LONG TERM GOAL #1   Title  ind with advanced HEP for  self management of symptoms    Status  On-going      PT LONG TERM GOAL #2   Title  Pt able to have complete BM with diameter of stool at least quarter size around    Status  On-going      PT LONG TERM GOAL #3   Title  pt able to have less frequent BM, 3 at most per day, due to improved pelvic floor coordination    Status  On-going      PT LONG TERM GOAL #4   Title  Pt will demonstrate single leg stand of at least 10 seconds on each side due to improved LE strength and balance    Status  On-going      PT LONG TERM GOAL #5   Title  pt will report 60% less pain with gas or bowel movement    Status  On-going            Plan - 02/21/18 1344    Clinical Impression Statement  Pt tolerated treatment well and was able to tolerate palpation and stretching with one finger to both  sphincters and puborectalis muscles.  Pt was able to understand breathing technique and how to relax muscle tone when using diaphragmatic breathing.  She was able to perform pelvic floor contraction of 2/5 today and hold for 1 sec.  She was educated with tactile cues on contract/relax 1 sec/3sec.  Pt will benefit from skilled PT to continue to progress pelvic floor strengh and coordination.    PT Treatment/Interventions  ADLs/Self Care Home Management;Biofeedback;Cryotherapy;Electrical Stimulation;Moist Heat;Therapeutic exercise;Therapeutic activities;Gait training;Neuromuscular re-education;Patient/family education;Manual techniques;Passive range of motion;Dry needling;Energy conservation    PT Next Visit Plan  biofeedback, review dilators and self massage    Consulted and Agree with Plan of Care  Patient       Patient will benefit from skilled therapeutic intervention in order to improve the following deficits and impairments:  Pain, Increased muscle spasms, Decreased strength, Decreased range of motion, Impaired tone, Postural dysfunction, Decreased skin integrity, Impaired flexibility, Decreased coordination  Visit Diagnosis: Other muscle spasm  Other lack of coordination  Muscle weakness (generalized)     Problem List Patient Active Problem List   Diagnosis Date Noted  . Rectal cancer ([place]) 07/26/2017  . Genetic testing 05/08/2017  . Family history of breast cancer   . Adenocarcinoma of rectum ([place]) 04/10/2017    Zannie Cove, PT 02/21/2018, 2:03 PM  [facility] [address] [place], [place], [address] Phone: [phone]   Fax:  [phone]  Name: Donna Brandt MRN: [id number] Date of Birth: 02-Feb-1976  PHYSICAL THERAPY DISCHARGE SUMMARY  Visits from Start of Care: 2  Current functional level related to goals / functional outcomes: See above goal   Remaining deficits: See above goals   Education /  Equipment: HEP Plan: Patient agrees to discharge.  Patient goals were not met. Patient is being discharged due to not returning since the last visit.  ?????    Google, PT 05/01/18 11:59 AM

## 2018-02-27 ENCOUNTER — Ambulatory Visit: Payer: BLUE CROSS/BLUE SHIELD | Admitting: Physical Therapy

## 2018-02-28 ENCOUNTER — Encounter

## 2018-03-27 DIAGNOSIS — H40033 Anatomical narrow angle, bilateral: Secondary | ICD-10-CM | POA: Diagnosis not present

## 2018-03-27 DIAGNOSIS — H5213 Myopia, bilateral: Secondary | ICD-10-CM | POA: Diagnosis not present

## 2018-03-27 DIAGNOSIS — H16223 Keratoconjunctivitis sicca, not specified as Sjogren's, bilateral: Secondary | ICD-10-CM | POA: Diagnosis not present

## 2018-04-09 DIAGNOSIS — H5213 Myopia, bilateral: Secondary | ICD-10-CM | POA: Diagnosis not present

## 2018-04-09 DIAGNOSIS — H1013 Acute atopic conjunctivitis, bilateral: Secondary | ICD-10-CM | POA: Diagnosis not present

## 2018-04-10 ENCOUNTER — Encounter: Payer: Self-pay | Admitting: Neurology

## 2018-04-10 ENCOUNTER — Ambulatory Visit (INDEPENDENT_AMBULATORY_CARE_PROVIDER_SITE_OTHER): Payer: BLUE CROSS/BLUE SHIELD | Admitting: Neurology

## 2018-04-10 ENCOUNTER — Other Ambulatory Visit (INDEPENDENT_AMBULATORY_CARE_PROVIDER_SITE_OTHER): Payer: BLUE CROSS/BLUE SHIELD

## 2018-04-10 ENCOUNTER — Other Ambulatory Visit: Payer: Self-pay

## 2018-04-10 VITALS — BP 124/88 | HR 78 | Ht 66.0 in | Wt 181.0 lb

## 2018-04-10 DIAGNOSIS — R413 Other amnesia: Secondary | ICD-10-CM

## 2018-04-10 DIAGNOSIS — G629 Polyneuropathy, unspecified: Secondary | ICD-10-CM

## 2018-04-10 DIAGNOSIS — C20 Malignant neoplasm of rectum: Secondary | ICD-10-CM

## 2018-04-10 NOTE — Patient Instructions (Addendum)
1. Schedule MRI brain with and without contrast  We have sent a referral to [facility] for your MRI and they will call you directly to schedule your appt. They are located at [place]. If you need to contact them directly please call [phone].   2. Schedule EMG/NCV of both LE with Dr. Posey Pronto  3. Bloodwork for TSH, B12, folate, B6, B1, ESR, CRP, SPEP/IFE, ANA  Your provider requests that you have LABS drawn today.  We share a lab with [facility] - they are located in [address] (second floor) of this building.  Once you get there, please have a seat and the phlebotomist will call your name.  If you have waited more than 15 minutes, please advise the front desk  4. Schedule Neurocognitive testing  5. Refer to PT for Balance Therapy  6. Start Lyrica '75mg'$  samples: Take 1 capsule every night for 2 weeks, call our office on response, if no side effects but still a lot of pain, we can increase dose further as tolerated and send in refills  7. Follow-up in 3 months, call for any changes

## 2018-04-10 NOTE — Progress Notes (Signed)
NEUROLOGY CONSULTATION NOTE  Donna Brandt MRN: [id number] DOB: [birth year]  Referring provider: Ned Card, NP Primary care provider: none listed  Reason for consult:  Feet numbness, memory issues  Thank you for your kind referral of Donna Brandt for consultation of the above symptoms. Although her history is well known to you, please allow me to reiterate it for the purpose of our medical record. The patient was accompanied to the clinic by her boyfriend Dellis Filbert who also provides collateral information. Records and images were personally reviewed where available.   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 She had an MRI lumbar spine in 08/2017 with no evidence of metastatic disease, stenosis, or neural compression. There were fatty marrow changes from mid-L5 through the sacrum presumably secondary to previous radiation. Numbness had improved but she continued to report numbness in both soles. She states symptoms became more noticeable with pins and needles in her feet around December 2018. After her second surgery in April, she started noticing the paresthesias shooting up her legs above knee level. She was started on gabapentin but it did not help. She denied any burning/stabbing pain. She reports hands were unaffected. Since stopping Xeloda in January, the paresthesias do not go up to knee level anymore, mostly affecting her feet like she is walking on pebbles all the time, but now noticing leg weakness. She has difficulty going up stairs and has had falls because her legs would give out. She has fallen down stairs and fell in a bowling alley last August. She denies any neck or back pain. She denies any headaches, dizziness, diplopia,  dysarthria/dysphagia. She has frequent bowel movements, no urinary issues. She works at [employer] and [employer] and stands all day. She has also been noticing memory issues. Cognitive issues were attributed to gabapentin, but she states she noticed it before starting medication, but it was more noticeable with the gabapentin. She has difficulty remembering names, completing sentences, remembering what she was supposed to do. She compensates by taking a lot of notes at work. She denies getting lost driving and uses her GPS all the time. She lives with her boyfriend and 3 children, denies missing bill payments and medication. Her boyfriend notes she repeats herself when she gets distracted. They both note increased irritability and anxiety with stairs. She denies any family history of memory issues or neuropathy, no history of concussions or alcohol intake.   PAST MEDICAL HISTORY: Past Medical History:  Diagnosis Date  . Anxiety   . Colon cancer ([place])    rectal  . Complication of anesthesia    during colonoscopy and wisdom teeth extraction  . Family history of breast cancer   . HTN (hypertension)   . Migraine     PAST SURGICAL HISTORY: Past Surgical History:  Procedure Laterality Date  . ABDOMINAL HYSTERECTOMY     due to uterine fibroids  . AUGMENTATION MAMMAPLASTY Bilateral   . FLEXIBLE SIGMOIDOSCOPY N/A 04/12/2017   Procedure: FLEXIBLE SIGMOIDOSCOPY;  Surgeon: Milus Banister, MD;  [facility];  Service: Endoscopy;  Laterality: N/A;  . FLEXIBLE SIGMOIDOSCOPY N/A 07/18/2017   Procedure: FLEXIBLE SIGMOIDOSCOPY EXAM UNDER ANESTHESIA;  Surgeon: Ileana Roup, MD;  [facility];  Service: General;  Laterality: N/A;  . FLEXIBLE SIGMOIDOSCOPY  07/26/2017  Procedure: FLEXIBLE SIGMOIDOSCOPY;  Surgeon: Ileana Roup, MD;  [facility]: [facility];  Service: General;;  . FLEXIBLE SIGMOIDOSCOPY N/A 10/19/2017   Procedure: FLEXIBLE SIGMOIDOSCOPY;  Surgeon: Ileana Roup,  MD;  Location: [facility];  Service: General;  Laterality: N/A;  . ILEOSTOMY N/A 11/28/2017   Procedure: CLOSURE OF LOOP ILEOSTOMY ERAS PATHWAY;  Surgeon: Ileana Roup, MD;  [facility];  Service: General;  Laterality: N/A;  . LAPAROSCOPIC LOW ANTERIOR RESECTION N/A 07/26/2017   Procedure: LAPAROSCOPIC VS OPEN  LOW ANTERIOR RESECTION WITH DIVERITING LOOP ILEOSTOMY;  Surgeon: Ileana Roup, MD;  [facility]: [facility];  Service: General;  Laterality: N/A;  . PROCTOSCOPY  07/26/2017   Procedure: PROCTOSCOPY;  Surgeon: Ileana Roup, MD;  Location: [facility];  Service: General;;  . TONSILLECTOMY    . TUBAL LIGATION    . WISDOM TOOTH EXTRACTION      MEDICATIONS: Current Outpatient Medications on File Prior to Visit  Medication Sig Dispense Refill  . amLODipine (NORVASC) 10 MG tablet Take 10 mg by mouth daily.    . [employer] FIBER GUMMIES PO Take 2 each by mouth daily.    . ondansetron (ZOFRAN) 4 MG tablet Take 1 tablet (4 mg total) by mouth every 8 (eight) hours as needed for nausea or vomiting. 20 tablet 0  . pyridOXINE (VITAMIN B-6) 100 MG tablet Take 100 mg by mouth daily.     . SUMAtriptan (IMITREX) 100 MG tablet Take 100 mg by mouth every 2 (two) hours as needed for migraine. May repeat once in 2 hours if needed  99  . traMADol (ULTRAM) 50 MG tablet Take 1 tablet (50 mg total) by mouth every 6 (six) hours as needed (pain not controlled with ibuprofen and tylenol). 15 tablet 0  . valACYclovir (VALTREX) 500 MG tablet Take 500 mg by mouth daily as needed (for break out).      No current facility-administered medications on file prior to visit.     ALLERGIES: Allergies  Allergen Reactions  . Dilaudid [Hydromorphone Hcl] Itching  . Latex Rash and Other (See Comments)    Skin sensitivity     FAMILY HISTORY: Family History  Problem Relation Age of Onset  . Breast cancer Other 70    SOCIAL HISTORY: Social History   Socioeconomic History  . Marital status: Single     Spouse name: Not on file  . Number of children: Not on file  . Years of education: Not on file  . Highest education level: Not on file  Occupational History  . Not on file  Social Needs  . Financial resource strain: Not on file  . Food insecurity:    Worry: Not on file    Inability: Not on file  . Transportation needs:    Medical: Not on file    Non-medical: Not on file  Tobacco Use  . Smoking status: Never Smoker  . Smokeless tobacco: Never Used  Substance and Sexual Activity  . Alcohol use: Yes    Alcohol/week: 1.0 standard drinks    Types: 1 Glasses of wine per week    Comment: in social settings  . Drug use: No  . Sexual activity: Yes  Lifestyle  . Physical activity:    Days per week: Not on file    Minutes per session: Not on file  . Stress: Not on file  Relationships  . Social connections:    Talks on phone: Not on file    Gets together: Not on file  Attends religious service: Not on file    Active member of club or organization: Not on file    Attends meetings of clubs or organizations: Not on file    Relationship status: Not on file  . Intimate partner violence:    Fear of current or ex partner: Not on file    Emotionally abused: Not on file    Physically abused: Not on file    Forced sexual activity: Not on file  Other Topics Concern  . Not on file  Social History Narrative  . Not on file    REVIEW OF SYSTEMS: Constitutional: No fevers, chills, or sweats, no generalized fatigue, change in appetite Eyes: No visual changes, double vision, eye pain Ear, nose and throat: No hearing loss, ear pain, nasal congestion, sore throat Cardiovascular: No chest pain, palpitations Respiratory:  No shortness of breath at rest or with exertion, wheezes GastrointestinaI: No nausea, vomiting, diarrhea, abdominal pain, fecal incontinence Genitourinary:  No dysuria, urinary retention or frequency Musculoskeletal:  No neck pain, back pain Integumentary: No rash,  pruritus, skin lesions Neurological: as above Psychiatric: No depression, insomnia, anxiety Endocrine: No palpitations, fatigue, diaphoresis, mood swings, change in appetite, change in weight, increased thirst Hematologic/Lymphatic:  No anemia, purpura, petechiae. Allergic/Immunologic: no itchy/runny eyes, nasal congestion, recent allergic reactions, rashes  PHYSICAL EXAM: There were no vitals filed for this visit. General: No acute distress Head:  Normocephalic/atraumatic Eyes: Fundoscopic exam shows bilateral sharp discs, no vessel changes, exudates, or hemorrhages Neck: supple, no paraspinal tenderness, full range of motion Back: No paraspinal tenderness Heart: regular rate and rhythm Lungs: Clear to auscultation bilaterally. Vascular: No carotid bruits. Skin/Extremities: No rash, no edema Neurological Exam: Mental status: alert and oriented to person, place, and time, no dysarthria or aphasia, Fund of knowledge is appropriate.  Recent and remote memory are intact.  Attention and concentration are normal.    Able to name objects and repeat phrases.  Montreal Cognitive Assessment  04/10/2018  Visuospatial/ Executive (0/5) 4  Naming (0/3) 3  Attention: Read list of digits (0/2) 2  Attention: Read list of letters (0/1) 1  Attention: Serial 7 subtraction starting at 100 (0/3) 2  Language: Repeat phrase (0/2) 2  Language : Fluency (0/1) 1  Abstraction (0/2) 2  Delayed Recall (0/5) 3  Orientation (0/6) 6  Total 26   Cranial nerves: CN I: not tested CN II: pupils equal, round and reactive to light, visual fields intact, fundi unremarkable. CN III, IV, VI:  full range of motion, no nystagmus, no ptosis CN V: facial sensation intact CN VII: upper and lower face symmetric CN VIII: hearing intact to finger rub CN IX, X: gag intact, uvula midline CN XI: sternocleidomastoid and trapezius muscles intact CN XII: tongue midline Bulk & Tone: normal, no fasciculations. Motor: 5/5  throughout except for 2/5 eversion on both feet but appears to have good plantarflexion, no pronator drift. Sensation: intact to light touch, cold, pin on both UE, decreased pin and cold on right LE, decreased pin in stocking distribution to right ankle, decreased cold and vibration sense to ankles bilaterally. No extinction to double simultaneous stimulation.  Romberg test negative Deep Tendon Reflexes: +2 on both UE, unable to elicit on both LE, no ankle clonus Plantar responses: downgoing bilaterally Cerebellar: no incoordination on finger to nose, heel to shin. No dysdiadochokinesia Gait: narrow-based and steady, difficulty with tandem walk Tremor: none  IMPRESSION: This is a pleasant 42 year old right-handed woman with a history of hypertension, invasive  adenocarcinoma of the rectum s/p resection and diverting ileostomy in 07/2017, radiation and Xeloda, presenting for evaluation of paresthesias and leg weakness, as well as memory changes. 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 Etiology of focal symptoms unclear, an EMG/NCV of both lower extremities will be ordered to further evaluate symptoms. Neuropathy labs will be ordered. 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 MRI brain with and without contrast will be ordered to assess for underlying structural abnormality. Check TSH and B12. She will be scheduled for Neurocognitive testing. She states that although Xeloda has not been reported to cause cognitive issues, several members in her group have reported similar cognitive issues. She has had several falls due to neuropathy and will be referred for Balance Therapy. She is agreeable to trying Lyrica for the neuropathy, side effects discussed, she will start low dose 75mg   qhs for 2 weeks and update [place] if no side effects, we will uptitrate as tolerated. Follow-up in 3 months, she knows to call for any changes.   Thank you for allowing me to participate in the care of this patient. Please do not hesitate to call for any questions or concerns.   Ellouise Newer, M.D.  CC: Ned Card, NP

## 2018-04-14 LAB — PROTEIN ELECTROPHORESIS, SERUM
ALBUMIN ELP: 4.5 g/dL (ref 3.8–4.8)
ALPHA 1: 0.3 g/dL (ref 0.2–0.3)
Alpha 2: 0.5 g/dL (ref 0.5–0.9)
Beta 2: 0.4 g/dL (ref 0.2–0.5)
Beta Globulin: 0.6 g/dL (ref 0.4–0.6)
Gamma Globulin: 1.3 g/dL (ref 0.8–1.7)
Total Protein: 7.7 g/dL (ref 6.1–8.1)

## 2018-04-14 LAB — VITAMIN B12: VITAMIN B 12: 258 pg/mL (ref 200–1100)

## 2018-04-14 LAB — TSH: TSH: 2.25 mIU/L

## 2018-04-14 LAB — FOLATE: FOLATE: 7.8 ng/mL

## 2018-04-14 LAB — ANA: ANA: NEGATIVE

## 2018-04-14 LAB — SEDIMENTATION RATE: Sed Rate: 19 mm/h (ref 0–20)

## 2018-04-14 LAB — VITAMIN B6: Vitamin B6: 12.4 ng/mL (ref 2.1–21.7)

## 2018-04-14 LAB — IMMUNOFIXATION ELECTROPHORESIS
IGG (IMMUNOGLOBIN G), SERUM: 1463 mg/dL (ref 600–1640)
IGM, SERUM: 170 mg/dL (ref 50–300)
Immunofix Electr Int: NOT DETECTED
Immunoglobulin A: 138 mg/dL (ref 47–310)

## 2018-04-14 LAB — C-REACTIVE PROTEIN: CRP: 1.4 mg/L (ref <8.0)

## 2018-04-14 LAB — VITAMIN B1: Vitamin B1 (Thiamine): 11 nmol/L (ref 8–30)

## 2018-04-16 ENCOUNTER — Telehealth: Payer: Self-pay

## 2018-04-16 NOTE — Telephone Encounter (Signed)
-----   Message from Cameron Sprang, MD sent at 04/15/2018 11:18 AM EDT ----- Pls let her know bloodwork overall okay except for B12 level which is low normal. Range is between 200 to 1100, her level was 258. Would recommend starting a daily B12 1024mcg supplement, then we will recheck B12 level on her follow-up. thanks

## 2018-04-16 NOTE — Telephone Encounter (Signed)
Pls remind her that we started her on a low dose to make sure she is not having any side effects, if none, we can increase dose to 2 caps qhs (150mg ). Some people are on at total of 450mg  daily, she is on a very low dose. thanks

## 2018-04-16 NOTE — Telephone Encounter (Signed)
Spoke with pt relaying message below.  She let me know that she has not heard from PT yet.  Relayed their phone number to pt.  Pt states that Lyrica is not working.  Advised that I would let Dr. Delice Lesch know and return call if she has any recommendations.

## 2018-04-17 NOTE — Telephone Encounter (Signed)
Spoke with pt relaying message below.  She was in a rush, and I could not verify pharmacy.  I know she should be close to out of samples.  Will send Rx if she calls office.

## 2018-04-18 ENCOUNTER — Ambulatory Visit
Admission: RE | Admit: 2018-04-18 | Discharge: 2018-04-18 | Disposition: A | Discharge status: Home / Self Care | Payer: BLUE CROSS/BLUE SHIELD | Source: Ambulatory Visit | Attending: Neurology | Admitting: Neurology

## 2018-04-18 ENCOUNTER — Other Ambulatory Visit: Payer: Self-pay

## 2018-04-18 ENCOUNTER — Other Ambulatory Visit: Payer: Self-pay | Admitting: Obstetrics and Gynecology

## 2018-04-18 ENCOUNTER — Other Ambulatory Visit: Payer: Self-pay | Admitting: Oncology

## 2018-04-18 DIAGNOSIS — R413 Other amnesia: Secondary | ICD-10-CM

## 2018-04-18 DIAGNOSIS — G629 Polyneuropathy, unspecified: Secondary | ICD-10-CM

## 2018-04-18 DIAGNOSIS — Z1231 Encounter for screening mammogram for malignant neoplasm of breast: Secondary | ICD-10-CM

## 2018-04-18 DIAGNOSIS — C20 Malignant neoplasm of rectum: Secondary | ICD-10-CM

## 2018-04-18 MED ORDER — GADOBENATE DIMEGLUMINE 529 MG/ML IV SOLN
20.0000 mL | Freq: Once | INTRAVENOUS | Status: AC | PRN
  Administered 2018-04-18: 18 mL via INTRAVENOUS

## 2018-04-19 ENCOUNTER — Telehealth: Payer: Self-pay | Admitting: *Deleted

## 2018-04-19 NOTE — Telephone Encounter (Signed)
Patient given results

## 2018-04-19 NOTE — Telephone Encounter (Signed)
-----   Message from Cameron Sprang, MD sent at 04/19/2018 11:59 AM EDT ----- Pls let her know MRI brain looks good, normal, no evidence of tumor, stroke, or bleed. Thanks

## 2018-04-25 ENCOUNTER — Ambulatory Visit: Payer: BLUE CROSS/BLUE SHIELD | Attending: Nurse Practitioner | Admitting: Physical Therapy

## 2018-04-25 ENCOUNTER — Other Ambulatory Visit: Payer: Self-pay | Admitting: *Deleted

## 2018-04-25 ENCOUNTER — Encounter: Payer: Self-pay | Admitting: Physical Therapy

## 2018-04-25 ENCOUNTER — Ambulatory Visit (INDEPENDENT_AMBULATORY_CARE_PROVIDER_SITE_OTHER): Payer: BLUE CROSS/BLUE SHIELD | Admitting: Neurology

## 2018-04-25 DIAGNOSIS — M5417 Radiculopathy, lumbosacral region: Secondary | ICD-10-CM

## 2018-04-25 DIAGNOSIS — R413 Other amnesia: Secondary | ICD-10-CM | POA: Diagnosis not present

## 2018-04-25 DIAGNOSIS — R2681 Unsteadiness on feet: Secondary | ICD-10-CM | POA: Insufficient documentation

## 2018-04-25 DIAGNOSIS — R2689 Other abnormalities of gait and mobility: Secondary | ICD-10-CM | POA: Diagnosis not present

## 2018-04-25 DIAGNOSIS — M6281 Muscle weakness (generalized): Secondary | ICD-10-CM | POA: Insufficient documentation

## 2018-04-25 DIAGNOSIS — C20 Malignant neoplasm of rectum: Secondary | ICD-10-CM

## 2018-04-25 MED ORDER — PREGABALIN 75 MG PO CAPS: ORAL_CAPSULE | ORAL | 5 refills | Status: DC

## 2018-04-25 NOTE — Procedures (Signed)
[facility]  [place], [place]  [place],  [address] Tel: [phone] Fax:  [phone] Test Date:  04/25/2018  Patient: Donna Brandt DOB: January 07, 1976 Physician: Narda Amber, DO  Sex: Female Height: 5\' 6"  Ref Phys: Ellouise Newer, MD  ID#: [id number] Temp: 35.0C Technician:    Patient Complaints: This is a 42 year old female with history of adenocarcinoma of the rectum s/p resection and diverting ileostomy in 07/2017, radiation and Xeloda referred for evaluation of bilateral feet pain and paresthesias.  NCV & EMG Findings: Extensive electrodiagnostic testing of the right lower extremity and additional studies of the left shows:  1. Bilateral sural and superficial peroneal sensory responses are within normal limits. 2. Bilateral tibial and peroneal motor responses are within normal limits. 3. Bilateral tibial H reflex studies are absent.  Tibial F-wave study on the right is mildly prolonged. 4. Sparse active on chronic motor axonal loss changes are seen in medial gastrocnemius and biceps femoris short head muscles, without accompanied active denervation.  Impression: 1. 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 2. There is no evidence of a large fiber sensorimotor polyneuropathy.   ___________________________ Narda Amber, DO    Nerve Conduction Studies Anti Sensory Summary Table   Site NR Peak (ms) Norm Peak (ms) P-T Amp (V) Norm P-T Amp  Left Sup Peroneal Anti Sensory (Ant Lat Mall)  35C  12 cm    2.5 <4.5 9.2 >5  Right Sup Peroneal Anti Sensory (Ant Lat Mall)  35C  12 cm    2.0 <4.5 7.2 >5  Left Sural Anti Sensory (Lat Mall)  35C  Calf    3.2 <4.5 9.4 >5  Right Sural Anti Sensory (Lat Mall)  35C  Calf    3.2 <4.5 7.1 >5   Motor Summary Table   Site NR Onset (ms) Norm Onset (ms) O-P Amp (mV) Norm O-P Amp Site1 Site2 Delta-0 (ms) Dist (cm) Vel  (m/s) Norm Vel (m/s)  Left Peroneal Motor (Ext Dig Brev)  35C  Ankle    4.1 <5.5 4.8 >3 B Fib Ankle 8.5 37.0 44 >40  B Fib    12.6  3.9  Poplt B Fib 1.3 8.0 62 >40  Poplt    13.9  3.8         Right Peroneal Motor (Ext Dig Brev)  35C  Ankle    2.9 <5.5 6.5 >3 B Fib Ankle 8.4 40.0 48 >40  B Fib    11.3  5.7  Poplt B Fib 1.2 9.0 75 >40  Poplt    12.5  5.6         Left Tibial Motor (Abd Hall Brev)  35C  Ankle    2.9 <6.0 8.9 >8 Knee Ankle 11.2 45.0 40 >40  Knee    14.1  5.0         Right Tibial Motor (Abd Hall Brev)  35C  Ankle    4.5 <6.0 9.7 >8 Knee Ankle 8.6 40.0 47 >40  Knee    13.1  6.3          F Wave Studies   NR F-Lat (ms) Lat Norm (ms) L-R F-Lat (ms)  Right Tibial (Mrkrs) (Abd Hallucis)  35C     56.20 <55    H Reflex Studies   NR H-Lat (ms) Lat Norm (ms) L-R H-Lat (ms)  Left Tibial (Gastroc)  35C  NR  <35   Right Tibial (Gastroc)  35C  NR  <35    EMG   Side Muscle Ins Act Fibs Psw Fasc Number Recrt Dur Dur. Amp Amp. Poly Poly. Comment  Right AntTibialis Nml Nml Nml Nml Nml Nml Nml Nml Nml Nml Nml Nml N/A  Right Gastroc Nml 1+ Nml Nml 1- Rapid Some 1+ Some 1+ Nml Nml N/A  Right Flex Dig Long Nml Nml Nml Nml Nml Nml Nml Nml Nml Nml Nml Nml N/A  Right RectFemoris Nml Nml Nml Nml Nml Nml Nml Nml Nml Nml Nml Nml N/A  Right GluteusMed Nml Nml Nml Nml Nml Nml Nml Nml Nml Nml Nml Nml N/A  Right Lumbo Parasp Low Nml Nml Nml Nml Nml Nml Nml Nml Nml Nml Nml Nml N/A  Right BicepsFemS Nml Nml Nml Nml 1- Rapid Some 1+ Some 1+ Nml Nml N/A  Left AntTibialis Nml Nml Nml Nml Nml Nml Nml Nml Nml Nml Nml Nml N/A  Left Gastroc Nml Nml 1+ Nml 1- Rapid Some 1+ Some 1+ Nml Nml N/A  Left Flex Dig Long Nml Nml Nml Nml Nml Nml Nml Nml Nml Nml Nml Nml N/A  Left RectFemoris Nml Nml Nml Nml Nml Nml Nml Nml Nml Nml Nml Nml N/A  Left GluteusMed Nml Nml Nml Nml Nml Nml Nml Nml Nml Nml Nml Nml N/A  Left BicepsFemS Nml Nml Nml Nml 1- Rapid Few 1+ Few 1+ Nml Nml N/A     Waveforms:

## 2018-04-26 ENCOUNTER — Ambulatory Visit: Payer: BLUE CROSS/BLUE SHIELD | Admitting: Physical Therapy

## 2018-04-26 NOTE — Therapy (Signed)
[facility] [address] [place], [place], [address] Phone: [phone]   Fax:  [phone]  Physical Therapy Evaluation  Patient Details  Name: Donna Brandt MRN: [id number] Date of Birth: 14-Aug-1975 Referring Provider: Ellouise Newer   Encounter Date: 04/25/2018  PT End of Session - 04/26/18 1313    Visit Number  1    Number of Visits  12    Date for PT Re-Evaluation  07/24/18    Authorization Type  BCBS-Medicaid-submitted to Medicaid    PT Start Time  0933    PT Stop Time  1016    PT Time Calculation (min)  43 min    Activity Tolerance  Patient tolerated treatment well       Past Medical History:  Diagnosis Date  . Anxiety   . Colon cancer ([place])    rectal  . Complication of anesthesia    during colonoscopy and wisdom teeth extraction  . Family history of breast cancer   . HTN (hypertension)   . Migraine     Past Surgical History:  Procedure Laterality Date  . ABDOMINAL HYSTERECTOMY     due to uterine fibroids  . AUGMENTATION MAMMAPLASTY Bilateral   . FLEXIBLE SIGMOIDOSCOPY N/A 04/12/2017   Procedure: FLEXIBLE SIGMOIDOSCOPY;  Surgeon: Milus Banister, MD;  Location: [facility];  Service: Endoscopy;  Laterality: N/A;  . FLEXIBLE SIGMOIDOSCOPY N/A 07/18/2017   Procedure: FLEXIBLE SIGMOIDOSCOPY EXAM UNDER ANESTHESIA;  Surgeon: Ileana Roup, MD;  Location: [facility];  Service: General;  Laterality: N/A;  . FLEXIBLE SIGMOIDOSCOPY  07/26/2017   Procedure: FLEXIBLE SIGMOIDOSCOPY;  Surgeon: Ileana Roup, MD;  Location: [facility];  Service: General;;  . FLEXIBLE SIGMOIDOSCOPY N/A 10/19/2017   Procedure: FLEXIBLE SIGMOIDOSCOPY;  Surgeon: Ileana Roup, MD;  Location: [facility];  Service: General;  Laterality: N/A;  . ILEOSTOMY N/A 11/28/2017   Procedure: CLOSURE OF LOOP ILEOSTOMY ERAS PATHWAY;  Surgeon: Ileana Roup, MD;  Location: [facility];  Service: General;  Laterality: N/A;  .  LAPAROSCOPIC LOW ANTERIOR RESECTION N/A 07/26/2017   Procedure: LAPAROSCOPIC VS OPEN  LOW ANTERIOR RESECTION WITH DIVERITING LOOP ILEOSTOMY;  Surgeon: Ileana Roup, MD;  Location: [facility];  Service: General;  Laterality: N/A;  . PROCTOSCOPY  07/26/2017   Procedure: PROCTOSCOPY;  Surgeon: Ileana Roup, MD;  Location: [facility];  Service: General;;  . TONSILLECTOMY    . TUBAL LIGATION    . WISDOM TOOTH EXTRACTION      There were no vitals filed for this visit.   Subjective Assessment - 04/25/18 0941    Subjective  Numbness and tingling in lower extremities following chemo and radiation, with pain going up legs.  Tingling started December 2018.  Feels that her legs are weak at times, and legs give way and she has fallen.  She reports issues with stair negotiation.  Reports 3-4 falls in the past 6 months.    Pertinent History  Rectal cancer (surgery x 2) s/p chemo and radiation    Patient Stated Goals  Pt's goals for PT are to improve balance and stop having falls, to help with the weakness.    Currently in Pain?  --   "not as much pain, as numbness"   Pain Location  Foot    Pain Orientation  Right;Left    Pain Descriptors / Indicators  Tingling    Pain Type  Chronic pain    Pain Onset  More than a month ago  Pain Frequency  Constant    Aggravating Factors   nothing makes it worse    Effect of Pain on Daily Activities  nothing really makes it better         [facility] PT Assessment - 04/25/18 0945      Assessment   Medical Diagnosis  falls, balance    Referring Provider  Ellouise Newer    Onset Date/Surgical Date  04/10/18   MD visit; symptoms worsening in past year     Precautions   Precautions  Fall      Balance Screen   Has the patient fallen in the past 6 months  Yes    How many times?  3-4    Has the patient had a decrease in activity level because of a fear of falling?   Yes    Is the patient reluctant to leave their home because of a fear of falling?   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   difficulty with kneeling to get things from lower surfaces     Observation/Other Assessments   Focus on Therapeutic Outcomes (FOTO)   NA      ROM / Strength   AROM / PROM / Strength  Strength      Strength   Overall Strength  Deficits    Strength Assessment Site  Hip;Knee;Ankle    Right/Left Hip  Right;Left    Right Hip Flexion  3+/5    Left Hip Flexion  3+/5    Right/Left Knee  Right;Left    Right Knee Flexion  4/5    Right Knee Extension  3-/5    Left Knee Flexion  4/5    Left Knee Extension  4/5    Right/Left Ankle  Right;Left    Right Ankle Dorsiflexion  3+/5    Right Ankle Plantar Flexion  3+/5    Left Ankle Dorsiflexion  3+/5    Left Ankle Plantar Flexion  3+/5      Transfers   Transfers  Sit to Stand;Stand to Sit    Sit to Stand  From chair/3-in-1;6: Modified independent (Device/Increase time);With upper extremity assist   Unable to perform without UE support   Five time sit to stand comments   15.68   with UE support   Stand to Sit  6: Modified independent (Device/Increase time);With upper extremity assist;To chair/3-in-1;Uncontrolled descent   uncontrolled descent without UE support into sititng     Ambulation/Gait   Ambulation/Gait  Yes    Ambulation/Gait Assistance  6: Modified independent (Device/Increase time)    Ambulation Distance (Feet)  120 Feet    Assistive device  None    Gait Pattern  Step-through pattern;Decreased step length - right;Decreased step length - left    Ambulation Surface  Level;Indoor    Gait  velocity  9.09 sec = 3.61      Standardized Balance Assessment   Standardized Balance Assessment  Timed Up and Go  Test      Timed Up and Go Test   Normal TUG (seconds)  11.2    TUG Comments  Scores <13.5 seconds indicate increased fall risk.      Functional Gait  Assessment   Gait assessed   Yes    Gait Level Surface  Walks 20 ft, slow speed, abnormal gait pattern, evidence for imbalance or deviates 10-15 in outside of the 12 in walkway width. Requires more than 7 sec to ambulate 20 ft.   8.94   Change in Gait Speed  Able to smoothly change walking speed without loss of balance or gait deviation. Deviate no more than 6 in outside of the 12 in walkway width.    Gait with Horizontal Head Turns  Performs head turns smoothly with slight change in gait velocity (eg, minor disruption to smooth gait path), deviates 6-10 in outside 12 in walkway width, or uses an assistive device.    Gait with Vertical Head Turns  Performs task with slight change in gait velocity (eg, minor disruption to smooth gait path), deviates 6 - 10 in outside 12 in walkway width or uses assistive device    Gait and Pivot Turn  Pivot turns safely in greater than 3 sec and stops with no loss of balance, or pivot turns safely within 3 sec and stops with mild imbalance, requires small steps to catch balance.    Step Over Obstacle  Is able to step over one shoe box (4.5 in total height) without changing gait speed. No evidence of imbalance.    Gait with Narrow Base of Support  Is able to ambulate for 10 steps heel to toe with no staggering.    Gait with Eyes Closed  Walks 20 ft, slow speed, abnormal gait pattern, evidence for imbalance, deviates 10-15 in outside 12 in walkway width. Requires more than 9 sec to ambulate 20 ft.    Ambulating Backwards  Walks 20 ft, uses assistive device, slower speed, mild gait deviations, deviates 6-10 in outside 12 in walkway width.   16.22   Steps  Two feet to a stair, must use rail.    Total Score   19    FGA comment:  Scores <22/30 indicate increased fall risk.                Objective measurements completed on examination: See above findings.                PT Short Term Goals - 04/26/18 1323      PT SHORT TERM GOAL #1   Title  Pt will be independent with HEP for improved strength, balance, and gait.  TARGET 05/10/18    Baseline  No current HEP     Time  2    Period  Weeks    Status  New    Target Date  05/10/18      PT SHORT TERM GOAL #2   Title  Pt will verbalize understanding of fall prevention in home envrionment.      Baseline  at fall risk per FGA; has had 3-4 falls in past 6 months    Time  2    Period  Weeks    Status  New    Target Date  05/10/18        PT Long Term Goals - 04/26/18 1324      PT LONG TERM GOAL #1   Title  Pt will perform 5x sit<>stand (modified with UE support as  needed), in less than or equal to 12.5 seconds for decreased fall risk.  06/07/18    Baseline  with UE support, 15.68 sec    Time  6    Period  Weeks    Status  New    Target Date  06/07/18      PT LONG TERM GOAL #2   Title  Pt will improve FGA score to at least 22/30 for decreased fall risk.    Baseline  19/30(Scores <22/30 indicates increased fall risk.)    Time  6    Period  Weeks    Status  New    Target Date  06/07/18      PT LONG TERM GOAL #3   Title  Pt will negotiate at least 12 steps with handrail, with step-through pattern, for improved functional strength and stair negotiation.    Baseline  step-to pattern with rails    Time  6    Period  Weeks    Status  New    Target Date  06/07/18      PT LONG TERM GOAL #4   Title  Pt will ambulate at least 1000 ft, indoor and outdoor surfaces, independently, no LOB, for improved community gait.    Baseline  120 ft mod I (slowed pace)    Time  6    Period  Weeks    Status  New    Target Date  06/07/18             Plan - 04/26/18 1319    Clinical Impression Statement  Pt is a 42 year old  female with unsteadiness on feet and abnormality of gait, with history of 3-4 falls in past 6 months.  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  She presnets with decreased balance, decreased lower extremity strength, abnormal gait.  She is at fall risk per FGA score of 19/30.  She would benefit from skilled PT to address the above stated deficits for decreased fall risk and improved functional mobility.    History and Personal Factors relevant to plan of care:  hx of cancer, radiation/chemo/neuropathy, 3-4 falls in past 6 months    Clinical Presentation  Evolving    Clinical Presentation due to:  neuropathy, fall risk per FGA, decreased functional strength    Clinical Decision Making  Moderate    Rehab Potential  Good    Clinical Impairments Affecting Rehab Potential  history of radiation, peripheral neuropathy    PT Frequency  Other (comment)   3 visits over 2 weeks, then 2x/wk for 4 weeks (6 weeks POC)   PT Duration  6 weeks    PT Treatment/Interventions  ADLs/Self Care Home Management;DME Instruction;Balance training;Therapeutic exercise;Therapeutic activities;Functional mobility training;Gait training;Neuromuscular re-education;Stair training;Patient/family education    PT Next Visit Plan  Initiate HEP for lower extremity strengthening and balance; gait training and stair training    Consulted and Agree with Plan of Care  Patient       Patient will benefit from skilled therapeutic intervention in order to improve the following deficits and impairments:  Abnormal gait, Decreased balance, Decreased mobility, Decreased strength, Difficulty walking  Visit Diagnosis: Other abnormalities of gait and mobility  Muscle weakness (generalized)  Unsteadiness on feet     Problem List Patient Active Problem List   Diagnosis Date Noted  . Rectal cancer ([place]) 07/26/2017  . Genetic testing 05/08/2017  . Family history of breast cancer   .  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 04/26/2018, 1:30 PM  Frazier Butt., PT   [facility] [address] [place], [place], [address] Phone: [phone]   Fax:  [phone]  Name: Donna Brandt MRN: [id number] Date of Birth: 1976-02-07

## 2018-04-29 ENCOUNTER — Ambulatory Visit: Payer: BLUE CROSS/BLUE SHIELD | Admitting: Physical Therapy

## 2018-05-03 ENCOUNTER — Telehealth: Payer: Self-pay | Admitting: Physical Therapy

## 2018-05-03 ENCOUNTER — Encounter: Payer: Self-pay | Admitting: Physical Therapy

## 2018-05-03 ENCOUNTER — Ambulatory Visit: Payer: BLUE CROSS/BLUE SHIELD | Admitting: Physical Therapy

## 2018-05-03 DIAGNOSIS — R2689 Other abnormalities of gait and mobility: Secondary | ICD-10-CM | POA: Diagnosis not present

## 2018-05-03 DIAGNOSIS — M6281 Muscle weakness (generalized): Secondary | ICD-10-CM

## 2018-05-03 MED ORDER — AMBULATORY NON FORMULARY MEDICATION: 0 refills | Status: DC

## 2018-05-03 NOTE — Telephone Encounter (Signed)
Dr. Delice Lesch, I saw Karl Luke for her first PT visit since eval.  She is having difficulty getting up from a regular toilet seat due to leg weakness and she doesn't have anything to pull or push up from in her bathroom.  She would benefit from a bedside commode or 3 in 1 commode for improved ease of toileting and transfers.  If you agree, could you please write order in Epic?  Thank you, Mady Haagensen, PT

## 2018-05-03 NOTE — Telephone Encounter (Signed)
Meagen, pls see below and send for 3 in 1 commode order. Thanks!

## 2018-05-03 NOTE — Therapy (Signed)
[facility] [address] [place], [place], [address] Phone: [phone]   Fax:  [phone]  Physical Therapy Treatment  Patient Details  Name: Donna Brandt MRN: [id number] Date of Birth: 1975-08-28 Referring Provider (PT): Ellouise Newer   Encounter Date: 05/03/2018  PT End of Session - 05/03/18 1301    Visit Number  2    Number of Visits  12    Date for PT Re-Evaluation  07/24/18    Authorization Type  BCBS-Medicaid-Medicaid authorized 4 visits 05/03/18-05/16/18    Authorization Time Period  05/03/18-05/16/18    Authorization - Visit Number  1    Authorization - Number of Visits  4    PT Start Time  1157   Pt arrived late/PT unaware patient in lobby   PT Stop Time  1249    PT Time Calculation (min)  52 min    Activity Tolerance  Patient tolerated treatment well       Past Medical History:  Diagnosis Date  . Anxiety   . Colon cancer ([place])    rectal  . Complication of anesthesia    during colonoscopy and wisdom teeth extraction  . Family history of breast cancer   . HTN (hypertension)   . Migraine     Past Surgical History:  Procedure Laterality Date  . ABDOMINAL HYSTERECTOMY     due to uterine fibroids  . AUGMENTATION MAMMAPLASTY Bilateral   . FLEXIBLE SIGMOIDOSCOPY N/A 04/12/2017   Procedure: FLEXIBLE SIGMOIDOSCOPY;  Surgeon: Milus Banister, MD;  Location: [facility];  Service: Endoscopy;  Laterality: N/A;  . FLEXIBLE SIGMOIDOSCOPY N/A 07/18/2017   Procedure: FLEXIBLE SIGMOIDOSCOPY EXAM UNDER ANESTHESIA;  Surgeon: Ileana Roup, MD;  Location: [facility];  Service: General;  Laterality: N/A;  . FLEXIBLE SIGMOIDOSCOPY  07/26/2017   Procedure: FLEXIBLE SIGMOIDOSCOPY;  Surgeon: Ileana Roup, MD;  Location: [facility];  Service: General;;  . FLEXIBLE SIGMOIDOSCOPY N/A 10/19/2017   Procedure: FLEXIBLE SIGMOIDOSCOPY;  Surgeon: Ileana Roup, MD;  Location: [facility];  Service:  General;  Laterality: N/A;  . ILEOSTOMY N/A 11/28/2017   Procedure: CLOSURE OF LOOP ILEOSTOMY ERAS PATHWAY;  Surgeon: Ileana Roup, MD;  Location: [facility];  Service: General;  Laterality: N/A;  . LAPAROSCOPIC LOW ANTERIOR RESECTION N/A 07/26/2017   Procedure: LAPAROSCOPIC VS OPEN  LOW ANTERIOR RESECTION WITH DIVERITING LOOP ILEOSTOMY;  Surgeon: Ileana Roup, MD;  Location: [facility];  Service: General;  Laterality: N/A;  . PROCTOSCOPY  07/26/2017   Procedure: PROCTOSCOPY;  Surgeon: Ileana Roup, MD;  Location: [facility];  Service: General;;  . TONSILLECTOMY    . TUBAL LIGATION    . WISDOM TOOTH EXTRACTION      There were no vitals filed for this visit.  Subjective Assessment - 05/03/18 1202    Subjective  Had the EMG last week, but haven't gotten the results.  No falls since last visit.    Pertinent History  Rectal cancer (surgery x 2) s/p chemo and radiation    Patient Stated Goals  Pt's goals for PT are to improve balance and stop having falls, to help with the weakness.    Currently in Pain?  No/denies    Pain Onset  More than a month ago                       [facility] Adult PT Treatment/Exercise - 05/03/18 0001      Self-Care   Self-Care  Other Self-Care  Comments    Other Self-Care Comments   Pt has questions about neuropathy and weakness-she has not yet gotten back results of EMG; discussed fall prevention, especially with gait at night to bathroom-lighting, assistive device if needed, avoid lurching to reach out for furniture.  Pt asks about elevate toilet seat due to difficulty with getting up from low toilet seat surface.  Provided patient with DME stores in [place] ; pt requests follow-up with Dr. Delice Lesch to request order for 3:1 for home.      Exercises   Exercises  Knee/Hip      Knee/Hip Exercises: Seated   Long Arc Quad  Strengthening;Right;1 set;10 reps    Other Seated Knee/Hip Exercises  Seated bilateral ankle pumps x 5 reps, then  alternating ankle pumps 5 reps each leg.    Hamstring Curl  Strengthening;Right;1 set;10 reps   Resisted with red theraband     Knee/Hip Exercises: Supine   Short Arc Quad Sets  Strengthening;Right;1 set;5 reps   cues to slow descent    Bridges  Strengthening;Both;10 reps    Bridges Limitations  fatigues by 8th rep with increased hip sway    Single Leg Bridge  Strengthening;Right;1 set;5 reps    Straight Leg Raises  Strengthening;Right;1 set;5 reps   difficulty with terminal knee ext and controlled descent   Straight Leg Raises Limitations  Worked x 10 reps with therapist performing passive SLR, ten pt resisting eccentric phase to return to mat       Discussed need to work on exercises in less reps (starting with 5 reps, then working up to 2 sets of 5 reps) to avoid over-fatiguing muscles.        PT Education - 05/03/18 1300    Education Details  Initiated HEP for leg stregnthening    Person(s) Educated  Patient    Methods  Explanation;Demonstration;Handout    Comprehension  Verbalized understanding;Returned demonstration       PT Short Term Goals - 04/26/18 1323      PT SHORT TERM GOAL #1   Title  Pt will be independent with HEP for improved strength, balance, and gait.  TARGET 05/10/18    Baseline  No current HEP     Time  2    Period  Weeks    Status  New    Target Date  05/10/18      PT SHORT TERM GOAL #2   Title  Pt will verbalize understanding of fall prevention in home envrionment.      Baseline  at fall risk per FGA; has had 3-4 falls in past 6 months    Time  2    Period  Weeks    Status  New    Target Date  05/10/18        PT Long Term Goals - 04/26/18 1324      PT LONG TERM GOAL #1   Title  Pt will perform 5x sit<>stand (modified with UE support as needed), in less than or equal to 12.5 seconds for decreased fall risk.  06/07/18    Baseline  with UE support, 15.68 sec    Time  6    Period  Weeks    Status  New    Target Date  06/07/18      PT LONG  TERM GOAL #2   Title  Pt will improve FGA score to at least 22/30 for decreased fall risk.    Baseline  19/30(Scores <22/30 indicates increased fall risk.)  Time  6    Period  Weeks    Status  New    Target Date  06/07/18      PT LONG TERM GOAL #3   Title  Pt will negotiate at least 12 steps with handrail, with step-through pattern, for improved functional strength and stair negotiation.    Baseline  step-to pattern with rails    Time  6    Period  Weeks    Status  New    Target Date  06/07/18      PT LONG TERM GOAL #4   Title  Pt will ambulate at least 1000 ft, indoor and outdoor surfaces, independently, no LOB, for improved community gait.    Baseline  120 ft mod I (slowed pace)    Time  6    Period  Weeks    Status  New    Target Date  06/07/18            Plan - 05/03/18 1302    Clinical Impression Statement  Skilled PT session today focused on initiation of HEP for leg strengthening.  Pt noted to have muscle fatigue after 7-8 reps of each exercise; also she has decreased eccentric quad control with LAQ and with attempt at sit<>stand.  Functionally, pt reports continuing to have fatigue with stairs, near the top of the set of steps and also with going down steps.  Will continue to benefit from skilled PT to address strength and balance for improved funcitonal moiblity.    Rehab Potential  Good    Clinical Impairments Affecting Rehab Potential  history of radiation, peripheral neuropathy    PT Frequency  Other (comment)   3 visits over 2 weeks, then 2x/wk for 4 weeks (6 weeks POC)   PT Duration  6 weeks    PT Treatment/Interventions  ADLs/Self Care Home Management;DME Instruction;Balance training;Therapeutic exercise;Therapeutic activities;Functional mobility training;Gait training;Neuromuscular re-education;Stair training;Patient/family education    PT Next Visit Plan  Review HEP, continue to work on eccentric quad strengthening, lower height step training, gait and  balance activities    PT Home Exercise Plan  [id number] ([place])    Recommended Other Services  Pt requests order for [facility] for elevated toilet seat -PT to ask Dr. Delice Lesch for order    Consulted and Agree with Plan of Care  Patient       Patient will benefit from skilled therapeutic intervention in order to improve the following deficits and impairments:  Abnormal gait, Decreased balance, Decreased mobility, Decreased strength, Difficulty walking  Visit Diagnosis: Muscle weakness (generalized)     Problem List Patient Active Problem List   Diagnosis Date Noted  . Rectal cancer ([place]) 07/26/2017  . Genetic testing 05/08/2017  . Family history of breast cancer   . 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 05/03/2018, 1:06 PM  Frazier Butt., PT  [facility] [address] [place], [place], [address] Phone: [phone]   Fax:  [phone]  Name: Donna Brandt MRN: [id number] Date of Birth: 1975/08/18

## 2018-05-03 NOTE — Telephone Encounter (Signed)
Order placed

## 2018-05-03 NOTE — Patient Instructions (Addendum)
Access Code: [email or web address]  URL: [email or web address]  Date: 05/03/2018  Prepared by: Mady Haagensen   Exercises  Seated Hamstring Curl with Anchored Resistance - 5 reps - 2 sets - 1x daily - 4x weekly  Supine Bridge - 5 reps - 2 sets - 3 sec hold - 1x daily - 5x weekly  Supine Short Arc Quad - 5 reps - 2 sets - 3 sec hold - 1x daily - 5x weekly  Seated Ankle Pumps on Table - 5 reps - 2 sets - 1x daily - 5x weekly    Medical Supply Stores:  [place] ([place] [address]) [place]

## 2018-05-06 ENCOUNTER — Encounter: Payer: Self-pay | Admitting: Physical Therapy

## 2018-05-06 ENCOUNTER — Ambulatory Visit: Payer: BLUE CROSS/BLUE SHIELD | Admitting: Physical Therapy

## 2018-05-06 DIAGNOSIS — R2689 Other abnormalities of gait and mobility: Secondary | ICD-10-CM

## 2018-05-06 DIAGNOSIS — M6281 Muscle weakness (generalized): Secondary | ICD-10-CM

## 2018-05-06 NOTE — Therapy (Signed)
[facility] [address] [place], [place], [address] Phone: [phone]   Fax:  [phone]  Physical Therapy Treatment  Patient Details  Name: Donna Brandt MRN: [id number] Date of Birth: 1976-05-08 Referring Provider (PT): Ellouise Newer   Encounter Date: 05/06/2018  PT End of Session - 05/06/18 1137    Visit Number  3    Number of Visits  12    Date for PT Re-Evaluation  07/24/18    Authorization Type  BCBS-Medicaid-Medicaid authorized 4 visits 05/03/18-05/16/18    Authorization Time Period  05/03/18-05/16/18    Authorization - Visit Number  2    Authorization - Number of Visits  4    PT Start Time  0847    PT Stop Time  0938    PT Time Calculation (min)  51 min    Activity Tolerance  Patient tolerated treatment well    Behavior During Therapy  [facility] for tasks assessed/performed       Past Medical History:  Diagnosis Date  . Anxiety   . Colon cancer ([place])    rectal  . Complication of anesthesia    during colonoscopy and wisdom teeth extraction  . Family history of breast cancer   . HTN (hypertension)   . Migraine     Past Surgical History:  Procedure Laterality Date  . ABDOMINAL HYSTERECTOMY     due to uterine fibroids  . AUGMENTATION MAMMAPLASTY Bilateral   . FLEXIBLE SIGMOIDOSCOPY N/A 04/12/2017   Procedure: FLEXIBLE SIGMOIDOSCOPY;  Surgeon: Milus Banister, MD;  Location: [facility];  Service: Endoscopy;  Laterality: N/A;  . FLEXIBLE SIGMOIDOSCOPY N/A 07/18/2017   Procedure: FLEXIBLE SIGMOIDOSCOPY EXAM UNDER ANESTHESIA;  Surgeon: Ileana Roup, MD;  Location: [facility];  Service: General;  Laterality: N/A;  . FLEXIBLE SIGMOIDOSCOPY  07/26/2017   Procedure: FLEXIBLE SIGMOIDOSCOPY;  Surgeon: Ileana Roup, MD;  Location: [facility];  Service: General;;  . FLEXIBLE SIGMOIDOSCOPY N/A 10/19/2017   Procedure: FLEXIBLE SIGMOIDOSCOPY;  Surgeon: Ileana Roup, MD;  Location: [facility];   Service: General;  Laterality: N/A;  . ILEOSTOMY N/A 11/28/2017   Procedure: CLOSURE OF LOOP ILEOSTOMY ERAS PATHWAY;  Surgeon: Ileana Roup, MD;  Location: [facility];  Service: General;  Laterality: N/A;  . LAPAROSCOPIC LOW ANTERIOR RESECTION N/A 07/26/2017   Procedure: LAPAROSCOPIC VS OPEN  LOW ANTERIOR RESECTION WITH DIVERITING LOOP ILEOSTOMY;  Surgeon: Ileana Roup, MD;  Location: [facility];  Service: General;  Laterality: N/A;  . PROCTOSCOPY  07/26/2017   Procedure: PROCTOSCOPY;  Surgeon: Ileana Roup, MD;  Location: [facility];  Service: General;;  . TONSILLECTOMY    . TUBAL LIGATION    . WISDOM TOOTH EXTRACTION      There were no vitals filed for this visit.  Subjective Assessment - 05/06/18 0851    Subjective  Fell twice over the weekend.  Both times the leg gave way.  On the stairs, it was the left leg.  In the nail salon, it was the right leg.  Hurt my right wrist.      Pertinent History  Rectal cancer (surgery x 2) s/p chemo and radiation    Patient Stated Goals  Pt's goals for PT are to improve balance and stop having falls, to help with the weakness.    Currently in Pain?  Yes    Pain Score  6     Pain Location  Wrist    Pain Orientation  Right    Pain Descriptors /  Indicators  Tender;Aching    Pain Type  Acute pain    Pain Onset  More than a month ago    Aggravating Factors   gripping, twisting    Pain Relieving Factors  stopping to do it                       [facility] Adult PT Treatment/Exercise - 05/06/18 0001      Ambulation/Gait   Ambulation/Gait  Yes   stair negotiation   Stairs  Yes    Stairs Assistance  6: Modified independent (Device/Increase time)    Stair Management Technique  One rail Right;Alternating pattern;Step to pattern;Forwards   Alt pattern ascending; step-to pattern descending   Number of Stairs  4   4 reps   Height of Stairs  6    Gait Comments  Pt demonstrates how she normally goes up steps (step-through pattern  with rail) and down steps-step-to pattern (RLE leading and near-buckling when RLE is stance leg staying on step, when LLE leads down the step).  Instructed patient to lead down the steps with the "bad" leg (RLE), and pt does not demo any leg buckling in this technique.      Self-Care   Self-Care  Other Self-Care Comments    Other Self-Care Comments   Discussed recent fall:  No pain in lower extremities, but pt with new onset pain in R wrist, limiting ability to hold onto stairs and grip things since fall.  Educated patient that if pain continues or pain worsens with more limited activitty, she needs to contact MD to get wrist looked at.  Also, let patient know that PT plans to route this note with information about 2 recent falls to Dr. Delice Lesch.  Discussed falls, and she reports her leg just gives way at times.  Addressed safety with stairs, as pt has to negotiate stairs to her apartment.      Exercises   Exercises  Knee/Hip   Review of HEP given last visit-pt return demo understanding     Knee/Hip Exercises: Standing   Terminal Knee Extension  Strengthening;Right;1 set;10 reps;Theraband   Red   Theraband Level (Terminal Knee Extension)  Level 2 (Red)    Terminal Knee Extension Limitations  Resisted knee flexion in TKE position, red band, x 10 reps     Functional Squat  2 sets;5 reps   Holding chair for support   Functional Squat Limitations  Cues to avoid low squat, pt begins to fatigue with notable muscle tremor around 8th rep      Knee/Hip Exercises: Seated   Long Arc Quad  Strengthening;Right;1 set;10 reps    Other Seated Knee/Hip Exercises  Seated bilateral ankle pumps x 5 reps, then alternating ankle pumps 5 reps each leg.    Hamstring Curl  Strengthening;Both;2 sets;5 reps   Heel digs; Verbally reviewed resisted hamstring curls     Knee/Hip Exercises: Supine   Quad Sets  Strengthening;Both;2 sets;5 reps    Short Arc Lexicographer   Strengthening;Both;2 sets;5 reps    Other Supine Knee/Hip Exercises  Hamstring sets, 2 sets x 5 reps           Self Care:  (Continued): -Provided patient with order for 3:1 Bedside commode, and reminded her of DME places to follow-up for obtaining bedside commode for higher height/improved safety and ease of toileting.   PT Education - 05/06/18 1137    Education Details  Added supine quad sets and seated heel digs to HEP; safety with stair negotiation, step-to pattern with RLE leading when descending steps    Person(s) Educated  Patient    Methods  Explanation;Demonstration;Handout    Comprehension  Verbalized understanding;Returned demonstration       PT Short Term Goals - 04/26/18 1323      PT SHORT TERM GOAL #1   Title  Pt will be independent with HEP for improved strength, balance, and gait.  TARGET 05/10/18    Baseline  No current HEP     Time  2    Period  Weeks    Status  New    Target Date  05/10/18      PT SHORT TERM GOAL #2   Title  Pt will verbalize understanding of fall prevention in home envrionment.      Baseline  at fall risk per FGA; has had 3-4 falls in past 6 months    Time  2    Period  Weeks    Status  New    Target Date  05/10/18        PT Long Term Goals - 04/26/18 1324      PT LONG TERM GOAL #1   Title  Pt will perform 5x sit<>stand (modified with UE support as needed), in less than or equal to 12.5 seconds for decreased fall risk.  06/07/18    Baseline  with UE support, 15.68 sec    Time  6    Period  Weeks    Status  New    Target Date  06/07/18      PT LONG TERM GOAL #2   Title  Pt will improve FGA score to at least 22/30 for decreased fall risk.    Baseline  19/30(Scores <22/30 indicates increased fall risk.)    Time  6    Period  Weeks    Status  New    Target Date  06/07/18      PT LONG TERM GOAL #3   Title  Pt will negotiate at least 12 steps with handrail, with step-through pattern, for improved functional strength and stair  negotiation.    Baseline  step-to pattern with rails    Time  6    Period  Weeks    Status  New    Target Date  06/07/18      PT LONG TERM GOAL #4   Title  Pt will ambulate at least 1000 ft, indoor and outdoor surfaces, independently, no LOB, for improved community gait.    Baseline  120 ft mod I (slowed pace)    Time  6    Period  Weeks    Status  New    Target Date  06/07/18            Plan - 05/06/18 1138    Clinical Impression Statement  Continued to work on gentle RLE strengthening today, trying to focus on co-contraction and isometric exercise, in 1-2 sets of 5 reps to avoid fatigue of R quads and hamstrings.  Educated patient in better way to negotiate down steps, as this is a problem for her.  She reports 2 falls over the weekend, where her leg gave way.  Also, reports RLE or LLE gives way without warning at times; pt continues to report difficulty with slowing descent into sitting on lower surfaces such as toilet.  Pt will continue to benefit from skilled PT to address strength and balance for  overall improved functional mobility.    Rehab Potential  Good    Clinical Impairments Affecting Rehab Potential  history of radiation, peripheral neuropathy    PT Frequency  Other (comment)   3 visits over 2 weeks, then 2x/wk for 4 weeks (6 weeks POC)   PT Duration  6 weeks    PT Treatment/Interventions  ADLs/Self Care Home Management;DME Instruction;Balance training;Therapeutic exercise;Therapeutic activities;Functional mobility training;Gait training;Neuromuscular re-education;Stair training;Patient/family education    PT Next Visit Plan  Review HEP/Check STGs; continue to work on eccentric quad strengthening, lower height step training, gait and balance activities    PT Home Exercise Plan  [email or web address] ([place])    Consulted and Agree with Plan of Care  Patient       Patient will benefit from skilled therapeutic intervention in order to improve the following deficits and  impairments:  Abnormal gait, Decreased balance, Decreased mobility, Decreased strength, Difficulty walking  Visit Diagnosis: Muscle weakness (generalized)  Other abnormalities of gait and mobility     Problem List Patient Active Problem List   Diagnosis Date Noted  . Rectal cancer ([place]) 07/26/2017  . Genetic testing 05/08/2017  . Family history of breast cancer   . 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 05/06/2018, 11:45 AM  Frazier Butt., PT'  [facility] [address] [place], [place], [address] Phone: [phone]   Fax:  [phone]  Name: Donna Brandt MRN: [id number] Date of Birth: 1976/06/10

## 2018-05-06 NOTE — Patient Instructions (Addendum)
Access Code: [email or web address]  URL: [email or web address]  Date: 05/06/2018  Prepared by: Mady Haagensen   Exercises  Seated Hamstring Curl with Anchored Resistance - 5 reps - 2 sets - 1x daily - 4x weekly  Supine Bridge - 5 reps - 2 sets - 3 sec hold - 1x daily - 5x weekly  Supine Short Arc Quad - 5 reps - 2 sets - 3 sec hold - 1x daily - 5x weekly  Seated Ankle Pumps on Table - 5 reps - 2 sets - 1x daily - 5x weekly   Added 05/06/18: Supine Quadricep Sets - 5 reps - 2 sets - 3 sec hold - 1x daily - 5x weekly  Seated Hamstring Set - 5 reps - 2 sets - 3 sec hold - 1x daily - 5x weekly

## 2018-05-10 ENCOUNTER — Encounter: Payer: Self-pay | Admitting: Physical Therapy

## 2018-05-10 ENCOUNTER — Telehealth: Payer: Self-pay | Admitting: Neurology

## 2018-05-10 ENCOUNTER — Ambulatory Visit: Payer: BLUE CROSS/BLUE SHIELD | Attending: Nurse Practitioner | Admitting: Physical Therapy

## 2018-05-10 VITALS — BP 122/93 | HR 67

## 2018-05-10 DIAGNOSIS — R2681 Unsteadiness on feet: Secondary | ICD-10-CM | POA: Diagnosis present

## 2018-05-10 DIAGNOSIS — R2689 Other abnormalities of gait and mobility: Secondary | ICD-10-CM | POA: Insufficient documentation

## 2018-05-10 DIAGNOSIS — M6281 Muscle weakness (generalized): Secondary | ICD-10-CM

## 2018-05-10 NOTE — Telephone Encounter (Signed)
Patient is calling in wanting her recent EMG results. Please call her back at [phone]. Thanks!

## 2018-05-10 NOTE — Therapy (Addendum)
[facility] [address] [place], [place], [address] Phone: [phone]   Fax:  [phone]  Physical Therapy Treatment  Patient Details  Name: Donna Brandt MRN: [id number] Date of Birth: 01-22-1976 Referring Provider (PT): Ellouise Newer   Encounter Date: 05/10/2018  PT End of Session - 05/10/18 1257    Visit Number  4    Number of Visits  12    Date for PT Re-Evaluation  07/24/18    Authorization Type  BCBS-Medicaid-Medicaid authorized 4 visits 05/03/18-05/16/18    Authorization Time Period  05/03/18-05/16/18    Authorization - Visit Number  3    Authorization - Number of Visits  4    PT Start Time  9179   pt late   PT Stop Time  1230    PT Time Calculation (min)  34 min    Activity Tolerance  Patient tolerated treatment well    Behavior During Therapy  [facility] for tasks assessed/performed       Past Medical History:  Diagnosis Date  . Anxiety   . Colon cancer ([place])    rectal  . Complication of anesthesia    during colonoscopy and wisdom teeth extraction  . Family history of breast cancer   . HTN (hypertension)   . Migraine     Past Surgical History:  Procedure Laterality Date  . ABDOMINAL HYSTERECTOMY     due to uterine fibroids  . AUGMENTATION MAMMAPLASTY Bilateral   . FLEXIBLE SIGMOIDOSCOPY N/A 04/12/2017   Procedure: FLEXIBLE SIGMOIDOSCOPY;  Surgeon: Milus Banister, MD;  [facility]: [facility];  Service: Endoscopy;  Laterality: N/A;  . FLEXIBLE SIGMOIDOSCOPY N/A 07/18/2017   Procedure: FLEXIBLE SIGMOIDOSCOPY EXAM UNDER ANESTHESIA;  Surgeon: Ileana Roup, MD;  Location: [facility];  Service: General;  Laterality: N/A;  . FLEXIBLE SIGMOIDOSCOPY  07/26/2017   Procedure: FLEXIBLE SIGMOIDOSCOPY;  Surgeon: Ileana Roup, MD;  Location: [facility]S;  Service: General;;  . FLEXIBLE SIGMOIDOSCOPY N/A 10/19/2017   Procedure: FLEXIBLE SIGMOIDOSCOPY;  Surgeon: Ileana Roup, MD;  Location: [facility]  ENDOSCOPY;  Service: General;  Laterality: N/A;  . ILEOSTOMY N/A 11/28/2017   Procedure: CLOSURE OF LOOP ILEOSTOMY ERAS PATHWAY;  Surgeon: Ileana Roup, MD;  [facility];  Service: General;  Laterality: N/A;  . LAPAROSCOPIC LOW ANTERIOR RESECTION N/A 07/26/2017   Procedure: LAPAROSCOPIC VS OPEN  LOW ANTERIOR RESECTION WITH DIVERITING LOOP ILEOSTOMY;  Surgeon: Ileana Roup, MD;  [facility];  Service: General;  Laterality: N/A;  . PROCTOSCOPY  07/26/2017   Procedure: PROCTOSCOPY;  Surgeon: Ileana Roup, MD;  [facility];  Service: General;;  . TONSILLECTOMY    . TUBAL LIGATION    . WISDOM TOOTH EXTRACTION      Vitals:   05/10/18 1256  BP: (!) 122/93  Pulse: 67    Subjective Assessment - 05/10/18 1256    Subjective  has been in the ED with daughter all night - feels like her feet and R LE are swollen; has had no falls    Pertinent History  Rectal cancer (surgery x 2) s/p chemo and radiation    Patient Stated Goals  Pt's goals for PT are to improve balance and stop having falls, to help with the weakness.    Currently in Pain?  Yes    Pain Score  2     Pain Location  Wrist    Pain Orientation  Right    Pain Descriptors / Indicators  Sore;Tightness  Pain Type  Acute pain                       [facility] Adult PT Treatment/Exercise - 05/10/18 0001      Exercises   Exercises  Knee/Hip      Knee/Hip Exercises: Machines for Strengthening   Cybex Leg Press  B LE x 12 - 60#; eccentric: out with 2 back with 1 (R LE) 2 x 10 - 30#      Knee/Hip Exercises: Standing   Step Down  Right;3 sets;5 reps;Hand Hold: 2;Step Height: 4"    Step Down Limitations  for eccentric control with stair navigation      Knee/Hip Exercises: Seated   Long Arc Quad  Strengthening;Both;2 sets;10 reps    Long Arc Quad Limitations  + ball squeeze; alternating LE      Knee/Hip Exercises: Supine   Bridges  Strengthening;Both;2 sets;10 reps    Bridges  Limitations  with LE elevated on physioball to target HS    Straight Leg Raise with External Rotation  Strengthening;Both;1 set;10 reps    Straight Leg Raise with External Rotation Limitations  focusing on form and control        Access Code: [email or web address]  URL: [email or web address]  Date: 05/10/2018  Prepared by: Allena Katz   Exercises  Lateral Step Down - 10 reps - 2 sets - 1x daily - 7x weekly  Full Leg Press - 10 reps - 2 sets - 1x daily - 7x weekly  Eccentric Leg Press - 10 reps - 2 sets - 1x daily - 7x weekly  Straight Leg Raise with External Rotation - 10 reps - 2 sets - 1x daily - 7x weekly        PT Education - 05/10/18 1257    Education Details  updated HEP to include gym activities as she wants to use her community gym    Person(s) Educated  Patient    Methods  Explanation;Demonstration;Handout    Comprehension  Verbalized understanding;Returned demonstration       PT Short Term Goals - 04/26/18 1323      PT SHORT TERM GOAL #1   Title  Pt will be independent with HEP for improved strength, balance, and gait.  TARGET 05/10/18    Baseline  No current HEP     Time  2    Period  Weeks    Status  New    Target Date  05/10/18      PT SHORT TERM GOAL #2   Title  Pt will verbalize understanding of fall prevention in home envrionment.      Baseline  at fall risk per FGA; has had 3-4 falls in past 6 months    Time  2    Period  Weeks    Status  New    Target Date  05/10/18        PT Long Term Goals - 04/26/18 1324      PT LONG TERM GOAL #1   Title  Pt will perform 5x sit<>stand (modified with UE support as needed), in less than or equal to 12.5 seconds for decreased fall risk.  06/07/18    Baseline  with UE support, 15.68 sec    Time  6    Period  Weeks    Status  New    Target Date  06/07/18      PT LONG TERM GOAL #2   Title  Pt will improve FGA score to  at least 22/30 for decreased fall risk.    Baseline  19/30(Scores <22/30 indicates  increased fall risk.)    Time  6    Period  Weeks    Status  New    Target Date  06/07/18      PT LONG TERM GOAL #3   Title  Pt will negotiate at least 12 steps with handrail, with step-through pattern, for improved functional strength and stair negotiation.    Baseline  step-to pattern with rails    Time  6    Period  Weeks    Status  New    Target Date  06/07/18      PT LONG TERM GOAL #4   Title  Pt will ambulate at least 1000 ft, indoor and outdoor surfaces, independently, no LOB, for improved community gait.    Baseline  120 ft mod I (slowed pace)    Time  6    Period  Weeks    Status  New    Target Date  06/07/18            Plan - 05/10/18 1259    Clinical Impression Statement  Skilled session focusing on targeting quad and hamstring strengthening in a gentle fashion with time for multiple rest breaks to prevent excessive fatigue. Patient tolerable to all new ther ex without issue. Reports feeling of R LE sweeling after being in ED wiht daughtre and prolonged sitting - education on need for LE movement for fluid uptake and distribution from LE. Updated HEP. WIll continue to progress towards goals.     Rehab Potential  Good    Clinical Impairments Affecting Rehab Potential  history of radiation, peripheral neuropathy    PT Frequency  Other (comment)   3 visits over 2 weeks, then 2x/wk for 4 weeks (6 weeks POC)   PT Duration  6 weeks    PT Treatment/Interventions  ADLs/Self Care Home Management;DME Instruction;Balance training;Therapeutic exercise;Therapeutic activities;Functional mobility training;Gait training;Neuromuscular re-education;Stair training;Patient/family education    PT Next Visit Plan  Review HEP/Check STGs; continue to work on eccentric quad strengthening, lower height step training, gait and balance activities    PT Home Exercise Plan  [id number] ([place])    Consulted and Agree with Plan of Care  Patient       Patient will benefit from skilled  therapeutic intervention in order to improve the following deficits and impairments:  Abnormal gait, Decreased balance, Decreased mobility, Decreased strength, Difficulty walking  Visit Diagnosis: Muscle weakness (generalized)  Other abnormalities of gait and mobility  Unsteadiness on feet     Problem List Patient Active Problem List   Diagnosis Date Noted  . Rectal cancer ([place]) 07/26/2017  . Genetic testing 05/08/2017  . Family history of breast cancer   . 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 [place], [place], [address] Phone: [phone]   Fax:  [phone]  Name: Donna Brandt MRN: [id number] Date of Birth: Aug 28, 1975

## 2018-05-10 NOTE — Telephone Encounter (Signed)
Spoke with pt relaying  Message below.  I let he know that I will  fax of report of EMG to PT.  Pt asked where she would be able to get a 3 in 1 commode - I advised any medical supply store.

## 2018-05-10 NOTE — Telephone Encounter (Signed)
Pls let her know that the nerve test had shown evidence of pinched nerves on both sciatic regions (lower back), physical therapy is still the recommendation. Thanks

## 2018-05-13 ENCOUNTER — Encounter: Payer: Self-pay | Admitting: Physical Therapy

## 2018-05-13 ENCOUNTER — Ambulatory Visit: Payer: BLUE CROSS/BLUE SHIELD | Admitting: Physical Therapy

## 2018-05-13 DIAGNOSIS — M6281 Muscle weakness (generalized): Secondary | ICD-10-CM | POA: Diagnosis not present

## 2018-05-13 DIAGNOSIS — R2689 Other abnormalities of gait and mobility: Secondary | ICD-10-CM

## 2018-05-13 DIAGNOSIS — R2681 Unsteadiness on feet: Secondary | ICD-10-CM

## 2018-05-13 NOTE — Therapy (Signed)
[facility] [address] [place], [place], [address] Phone: [phone]   Fax:  [phone]  Physical Therapy Treatment  Patient Details  Name: Donna Brandt MRN: [id number] Date of Birth: [birth year] Referring Provider (PT): Ellouise Newer   Encounter Date: 05/13/2018  PT End of Session - 05/13/18 0809    Visit Number  5    Number of Visits  12    Date for PT Re-Evaluation  07/24/18    Authorization Type  BCBS-Medicaid-Medicaid authorized 4 visits 05/03/18-05/16/18    Authorization Time Period  05/03/18-05/16/18    Authorization - Visit Number  4    Authorization - Number of Visits  4    PT Start Time  0805   Pt brought back at [facility], checked in by front computer at 808   PT Stop Time  0848    PT Time Calculation (min)  43 min    Activity Tolerance  Patient tolerated treatment well    Behavior During Therapy  [facility] for tasks assessed/performed       Past Medical History:  Diagnosis Date  . Anxiety   . Colon cancer ([place])    rectal  . Complication of anesthesia    during colonoscopy and wisdom teeth extraction  . Family history of breast cancer   . HTN (hypertension)   . Migraine     Past Surgical History:  Procedure Laterality Date  . ABDOMINAL HYSTERECTOMY     due to uterine fibroids  . AUGMENTATION MAMMAPLASTY Bilateral   . FLEXIBLE SIGMOIDOSCOPY N/A 04/12/2017   Procedure: FLEXIBLE SIGMOIDOSCOPY;  Surgeon: Milus Banister, MD;  [facility];  Service: Endoscopy;  Laterality: N/A;  . FLEXIBLE SIGMOIDOSCOPY N/A 07/18/2017   Procedure: FLEXIBLE SIGMOIDOSCOPY EXAM UNDER ANESTHESIA;  Surgeon: Ileana Roup, MD;  Location: [facility];  Service: General;  Laterality: N/A;  . FLEXIBLE SIGMOIDOSCOPY  07/26/2017   Procedure: FLEXIBLE SIGMOIDOSCOPY;  Surgeon: Ileana Roup, MD;  [facility];  Service: General;;  . FLEXIBLE SIGMOIDOSCOPY N/A 10/19/2017   Procedure: FLEXIBLE SIGMOIDOSCOPY;   Surgeon: Ileana Roup, MD;  Location: [facility];  Service: General;  Laterality: N/A;  . ILEOSTOMY N/A 11/28/2017   Procedure: CLOSURE OF LOOP ILEOSTOMY ERAS PATHWAY;  Surgeon: Ileana Roup, MD;  [facility];  Service: General;  Laterality: N/A;  . LAPAROSCOPIC LOW ANTERIOR RESECTION N/A 07/26/2017   Procedure: LAPAROSCOPIC VS OPEN  LOW ANTERIOR RESECTION WITH DIVERITING LOOP ILEOSTOMY;  Surgeon: Ileana Roup, MD;  [facility];  Service: General;  Laterality: N/A;  . PROCTOSCOPY  07/26/2017   Procedure: PROCTOSCOPY;  Surgeon: Ileana Roup, MD;  [facility];  Service: General;;  . TONSILLECTOMY    . TUBAL LIGATION    . WISDOM TOOTH EXTRACTION      There were no vitals filed for this visit.  Subjective Assessment - 05/13/18 1214    Subjective  It's been a weekend-had 3 falls.  One fall rushing down the steps, one fall trying to make it to the bathroom, one fall trying to step into the tub.    Pertinent History  Rectal cancer (surgery x 2) s/p chemo and radiation    Patient Stated Goals  Pt's goals for PT are to improve balance and stop having falls, to help with the weakness.    Currently in Pain?  No/denies  [facility] Adult PT Treatment/Exercise - 05/13/18 0816      Ambulation/Gait   Stairs  Yes    Stairs Assistance  6: Modified independent (Device/Increase time)    Stair Management Technique  One rail Right;Alternating pattern;Step to pattern;Forwards   Alt pattern ascending, step-to pattern descending   Number of Stairs  4   x 2 reps   Height of Stairs  6    Gait Comments  Pt reports she is not performing steps as practiced last visit, with step to pattern descending with RLE leading.  With cues, pt remembers and agrees to try it this way.      Self-Care   Self-Care  Other Self-Care Comments    Other Self-Care Comments   Discussed mechanism of recent falls and discussed fall prevention education:   reiterated safety with stair negotiation (step-to pattern descending leading with RLE)-pt has been either doing step through, or step to leading with LLE.  Discussed safety with bathroom transfers, including trying to sit on tub ledge and swing legs in, versus stepping over tub ledge.  Also discussed use of handrails for safety into and out of tub/shower, as well as at toilet.  Pt may try suction handrails, but PT encouraged pt to discuss need for handrails with her apartment complex.      Therapeutic Activites    Therapeutic Activities  Other Therapeutic Activities    Other Therapeutic Activities  Simulated stepping over tub ledge-pt leads with RLE to step into tub.  Tried stepping over with sideways motion or with LLE leading, but pt fearful on RLE giving way with these attempts (please note-pt's RLE gave way over the weekend stepping into tub with RLE leading).      Exercises   Exercises  Lumbar;Knee/Hip;Other Exercises    Other Exercises   Educated patient to perform some sort of exercise in supine or sitting (bridging, SLR, ankle pumps), prior to standing in order to "warm up" legs prior to initiating gait.      Lumbar Exercises: Stretches   Passive Hamstring Stretch  Right;Left;2 reps;30 seconds    Passive Hamstring Stretch Limitations  Supine using strap    Single Knee to Chest Stretch  Right;Left;3 reps;10 seconds    Pelvic Tilt  10 reps;5 seconds   In sitting and supine     Knee/Hip Exercises: Standing   Step Down  Right   Review of HEP given last visit-pt return demo     Knee/Hip Exercises: Seated   Sit to Sand  2 sets;5 reps;without UE support   from elevated mat, simulating bed height     Knee/Hip Exercises: Supine   Straight Leg Raise with External Rotation  Strengthening;Both;10 reps;2 sets    Straight Leg Raise with External Rotation Limitations  cues to slow pace and for control             PT Education - 05/13/18 1227    Education Details  Reiterated step to  pattern with RLE leading to descend steps    Person(s) Educated  Patient    Methods  Explanation;Demonstration;Handout    Comprehension  Verbalized understanding;Returned demonstration       PT Short Term Goals - 05/13/18 0810      PT SHORT TERM GOAL #1   Title  Pt will be independent with HEP for improved strength, balance, and gait.  TARGET 05/10/18    Baseline  No current HEP     Time  2    Period  Weeks  Status  Achieved      PT SHORT TERM GOAL #2   Title  Pt will verbalize understanding of fall prevention in home envrionment.      Baseline  at fall risk per FGA; has had 3-4 falls in past 6 months    Time  2    Period  Weeks    Status  Achieved        PT Long Term Goals - 05/13/18 1237      PT LONG TERM GOAL #1   Title  Pt will perform 5x sit<>stand (modified with UE support as needed), in less than or equal to 12.5 seconds for decreased fall risk.  06/07/18    Baseline  with UE support, 15.68 sec    Time  6    Period  Weeks    Status  On-going      PT LONG TERM GOAL #2   Title  Pt will improve FGA score to at least 22/30 for decreased fall risk.    Baseline  19/30(Scores <22/30 indicates increased fall risk.)    Time  6    Period  Weeks    Status  On-going      PT LONG TERM GOAL #3   Title  Pt will negotiate at least 12 steps with handrail, with step-through pattern, for improved functional strength and stair negotiation.    Baseline  step-to pattern with rails    Time  6    Period  Weeks    Status  On-going      PT LONG TERM GOAL #4   Title  Pt will ambulate at least 1000 ft, indoor and outdoor surfaces, independently, no LOB, for improved community gait.    Baseline  120 ft mod I (slowed pace)    Time  6    Period  Weeks    Status  On-going            Plan - 05/13/18 1228    Clinical Impression Statement  STGs assessed today, with pt meeting both STGs.  HEP has been initiated to address RLE strengthening, fall prevention education provided, with  continued discussion on mechanisms of falls.  Pt has had 3 additional falls over the weekend.-on steps, stepping into tub, trying to get to commode quickly.  Pt continues to have no back pain; Dr. Amparo Bristol note to patient is in regards to sciatic nerve involvement.  PT will continue to work with patient on strengthening, balance, gait  for decreased fall risk.    Rehab Potential  Good    Clinical Impairments Affecting Rehab Potential  history of radiation, peripheral neuropathy    PT Frequency  Other (comment)   3 visits over 2 weeks, then 2x/wk for 4 weeks (6 weeks POC)   PT Duration  6 weeks    PT Treatment/Interventions  ADLs/Self Care Home Management;DME Instruction;Balance training;Therapeutic exercise;Therapeutic activities;Functional mobility training;Gait training;Neuromuscular re-education;Stair training;Patient/family education    PT Next Visit Plan  Continue to work on lower extremity strengthening, with some additions to HEP for standing exercises and for general lumbar stability/flexibility    PT Home Exercise Plan  [id number] ([place])    Consulted and Agree with Plan of Care  Patient       Patient will benefit from skilled therapeutic intervention in order to improve the following deficits and impairments:  Abnormal gait, Decreased balance, Decreased mobility, Decreased strength, Difficulty walking  Visit Diagnosis: Muscle weakness (generalized)  Unsteadiness on feet  Other abnormalities of gait  and mobility     Problem List Patient Active Problem List   Diagnosis Date Noted  . Rectal cancer ([place]) 07/26/2017  . Genetic testing 05/08/2017  . Family history of breast cancer   . 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 05/13/2018, 12:38 PM Frazier Butt., PT  [facility] [address] [place], [place], [address] Phone: [phone]   Fax:  [phone]  Name: Donna Brandt MRN:  [id number] Date of Birth: 08-Dec-1975

## 2018-05-16 ENCOUNTER — Encounter: Payer: Self-pay | Admitting: Oncology

## 2018-05-16 ENCOUNTER — Inpatient Hospital Stay: Payer: BLUE CROSS/BLUE SHIELD | Attending: Oncology | Admitting: Oncology

## 2018-05-16 ENCOUNTER — Other Ambulatory Visit: Payer: Self-pay | Admitting: *Deleted

## 2018-05-16 ENCOUNTER — Inpatient Hospital Stay: Payer: BLUE CROSS/BLUE SHIELD

## 2018-05-16 ENCOUNTER — Ambulatory Visit: Payer: BLUE CROSS/BLUE SHIELD | Admitting: Physical Therapy

## 2018-05-16 ENCOUNTER — Telehealth: Payer: Self-pay

## 2018-05-16 VITALS — BP 148/107 | HR 66 | Temp 98.1°F | Resp 18 | Ht 66.0 in | Wt 182.2 lb

## 2018-05-16 DIAGNOSIS — G629 Polyneuropathy, unspecified: Secondary | ICD-10-CM | POA: Diagnosis not present

## 2018-05-16 DIAGNOSIS — F329 Major depressive disorder, single episode, unspecified: Secondary | ICD-10-CM | POA: Diagnosis not present

## 2018-05-16 DIAGNOSIS — C20 Malignant neoplasm of rectum: Secondary | ICD-10-CM

## 2018-05-16 DIAGNOSIS — Z923 Personal history of irradiation: Secondary | ICD-10-CM | POA: Diagnosis not present

## 2018-05-16 DIAGNOSIS — C7951 Secondary malignant neoplasm of bone: Secondary | ICD-10-CM | POA: Diagnosis not present

## 2018-05-16 DIAGNOSIS — I10 Essential (primary) hypertension: Secondary | ICD-10-CM

## 2018-05-16 LAB — CEA (IN HOUSE-CHCC): CEA (CHCC-In House): <1 ng/mL (ref 0.00–5.00)

## 2018-05-16 NOTE — Progress Notes (Signed)
[facility] OFFICE PROGRESS NOTE   Diagnosis: Rectal cancer  INTERVAL HISTORY:   Donna Brandt returns for a scheduled visit.  Good appetite.  She reports "gas "and urgency after certain foods.  No bleeding.  Good appetite. She continues to have numbness in the feet and legs.  The numbness is most prominent in the feet.  No hand numbness.  She is now followed by neurology and is taking Lyrica.  She reports intermittent weakness in the right leg.  Objective:  Vital signs in last 24 hours:  Blood pressure (!) 148/107, pulse 66, temperature 98.1 F (36.7 C), temperature source Oral, resp. rate 18, height '5\' 6"'$  (1.676 m), weight 182 lb 3.2 oz (82.6 kg), SpO2 100 %.    HEENT: Neck without mass Lymphatics: No cervical, supraclavicular, axillary, or inguinal nodes Resp: Lungs clear bilaterally Cardio: Regular rate and rhythm GI: No hepatosplenomegaly, no mass, nontender Vascular: No leg edema Neuro: Motor exam is intact in the legs and feet bilaterally  Lab Results:  Lab Results  Component Value Date   WBC 8.1 11/29/2017   HGB 11.2 (L) 11/29/2017   HCT 33.8 (L) 11/29/2017   MCV 78.2 11/29/2017   PLT 198 11/29/2017   NEUTROABS 2.4 11/16/2017    CMP  Lab Results  Component Value Date   NA 139 12/02/2017   K 3.8 12/02/2017   CL 106 12/02/2017   CO2 25 12/02/2017   GLUCOSE 110 (H) 12/02/2017   BUN 8 12/02/2017   CREATININE 0.52 12/02/2017   CALCIUM 10.2 12/02/2017   PROT 7.7 04/10/2018   ALBUMIN 4.0 11/16/2017   AST 21 11/16/2017   ALT 23 11/16/2017   ALKPHOS 114 11/16/2017   BILITOT 0.4 11/16/2017   GFRNONAA >60 12/02/2017   GFRAA >60 12/02/2017    Lab Results  Component Value Date   CEA1 <1.00 05/16/2018    Medications: I have reviewed the patient's current medications.   Assessment/Plan: 1. Rectal cancer, clinical stage T3b,N0,M0  colonoscopy 03/06/2017-biopsy of a rectal mass revealed fragments of an adenomatous lesion with high-grade  dysplasia, definite submucosa not available to evaluate for invasion  Proctoscopy/biopsy 03/23/2017 confirmed a mass at 6-7 centimeters from the anal verge, biopsy revealed polypoid colorectal mucosa with detached fragments of adenomatous mucosa  Staging CTs 03/16/2017, anterior rectal mass, no evidence of metastatic disease, no adenopathy,  MRI 03/21/2017-T3b,N0rectal tumor  Initiationof radiation and concurrent Xeloda 04/23/2017.Completed 05/30/2017.  Laparoscopic low anterior resection and diverting ileostomy 07/26/2017,ypT2,ypN0tumor with negative surgical margins, normal mismatch repair protein expression  Cycle 1 adjuvant Xeloda 08/20/2017  Xeloda placed on hold beginning 08/28/2017 due to bilateral leg numbness  Xeloda discontinued  Flexible sigmoidoscopy 10/19/2017- patent end-to-end colocolonic anastomosis characterized by healthy-appearing mucosa.  No specimens collected.  2. Hypertension  3. 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 MRI lumbar spine 09/05/2017-no evidence of metastatic disease to the lumbosacral spine. No stenosis or neural compression. Fatty marrow changes from mid L5 through the sacrum presumably secondary to previous radiation.Leg strength improved on exam 09/06/2017. Per patient report numbness improved, still present over the soles of both feet.    Evaluated by neurology 04/10/2018-diagnosed with neuropathy, started on Lyrica  6.Ileostomy reversal 11/28/2017      Disposition: Donna Brandt is in remission from rectal cancer.  She will return for an office visit and CEA in 6 months.  We will refer her to Dr. Ardis Hughs for a surveillance colonoscopy.  Donna Brandt will continue evaluation by neurology for the neuropathy  symptoms. We referred her to the pelvic physical therapy clinic.  She will discuss  diet maneuvers Dr. Ardis Hughs that may help with the irregular bowel  habits  Betsy Coder, MD  05/16/2018  1:36 PM

## 2018-05-16 NOTE — Telephone Encounter (Signed)
Printed avs and calender of upcoming appointment. Per 10/10 losx

## 2018-05-17 ENCOUNTER — Telehealth: Payer: Self-pay | Admitting: Emergency Medicine

## 2018-05-17 NOTE — Telephone Encounter (Signed)
-----   Message from Ladell Pier, MD sent at 05/16/2018  5:44 PM EDT ----- Please call patient, CEA is normal

## 2018-05-27 ENCOUNTER — Encounter: Payer: Self-pay | Admitting: Physical Therapy

## 2018-05-27 ENCOUNTER — Ambulatory Visit: Payer: BLUE CROSS/BLUE SHIELD | Admitting: Physical Therapy

## 2018-05-27 DIAGNOSIS — M6281 Muscle weakness (generalized): Secondary | ICD-10-CM

## 2018-05-27 DIAGNOSIS — R2681 Unsteadiness on feet: Secondary | ICD-10-CM

## 2018-05-27 NOTE — Patient Instructions (Signed)
Access Code: [email or web address]  URL: [email or web address]  Date: 05/27/2018  Prepared by: Mady Haagensen   Exercises  Seated Hamstring Curl with Anchored Resistance - 5 reps - 2 sets - 1x daily - 4x weekly  Supine Bridge - 5 reps - 2 sets - 3 sec hold - 1x daily - 5x weekly  Supine Short Arc Quad - 5 reps - 2 sets - 3 sec hold - 1x daily - 5x weekly  Seated Ankle Pumps on Table - 5 reps - 2 sets - 1x daily - 5x weekly  Supine Quadricep Sets - 5 reps - 2 sets - 3 sec hold - 1x daily - 5x weekly  Seated Hamstring Set - 5 reps - 2 sets - 3 sec hold - 1x daily - 5x weekly   Added 05/27/18 Sit to Stand - 10 reps - 2 sets - 1x daily - 7x weekly  Squat with Chair Touch - 10 reps - 2 sets - 1x daily - 7x weekly

## 2018-05-27 NOTE — Therapy (Signed)
[facility] [address] [place], [place], [address] Phone: [phone]   Fax:  [phone]  Physical Therapy Treatment  Patient Details  Name: Donna Brandt MRN: [id number] Date of Birth: 1976-05-17 Referring Provider (PT): Ellouise Newer   Encounter Date: 05/27/2018  PT End of Session - 05/27/18 1036    Visit Number  6    Number of Visits  12    Date for PT Re-Evaluation  07/24/18    Authorization Type  BCBS-Medicaid-Medicaid authorized 4 visits 05/03/18-05/16/18; 8 visits approved 05/17/18-06/13/18    Authorization Time Period  05/03/18-05/16/18; 05/17/18-06/13/18    Authorization - Visit Number  1   Additional approval period-see above   Authorization - Number of Visits  8    PT Start Time  0806   pt arrived late   PT Stop Time  0849    PT Time Calculation (min)  43 min    Activity Tolerance  Patient tolerated treatment well    Behavior During Therapy  [facility] for tasks assessed/performed       Past Medical History:  Diagnosis Date  . Anxiety   . Colon cancer ([place])    rectal  . Complication of anesthesia    during colonoscopy and wisdom teeth extraction  . Family history of breast cancer   . HTN (hypertension)   . Migraine     Past Surgical History:  Procedure Laterality Date  . ABDOMINAL HYSTERECTOMY     due to uterine fibroids  . AUGMENTATION MAMMAPLASTY Bilateral   . FLEXIBLE SIGMOIDOSCOPY N/A 04/12/2017   Procedure: FLEXIBLE SIGMOIDOSCOPY;  Surgeon: Milus Banister, MD;  Location: [facility];  Service: Endoscopy;  Laterality: N/A;  . FLEXIBLE SIGMOIDOSCOPY N/A 07/18/2017   Procedure: FLEXIBLE SIGMOIDOSCOPY EXAM UNDER ANESTHESIA;  Surgeon: Ileana Roup, MD;  Location: [facility];  Service: General;  Laterality: N/A;  . FLEXIBLE SIGMOIDOSCOPY  07/26/2017   Procedure: FLEXIBLE SIGMOIDOSCOPY;  Surgeon: Ileana Roup, MD;  Location: [facility];  Service: General;;  . FLEXIBLE SIGMOIDOSCOPY  N/A 10/19/2017   Procedure: FLEXIBLE SIGMOIDOSCOPY;  Surgeon: Ileana Roup, MD;  Location: [facility];  Service: General;  Laterality: N/A;  . ILEOSTOMY N/A 11/28/2017   Procedure: CLOSURE OF LOOP ILEOSTOMY ERAS PATHWAY;  Surgeon: Ileana Roup, MD;  Location: [facility];  Service: General;  Laterality: N/A;  . LAPAROSCOPIC LOW ANTERIOR RESECTION N/A 07/26/2017   Procedure: LAPAROSCOPIC VS OPEN  LOW ANTERIOR RESECTION WITH DIVERITING LOOP ILEOSTOMY;  Surgeon: Ileana Roup, MD;  Location: [facility];  Service: General;  Laterality: N/A;  . PROCTOSCOPY  07/26/2017   Procedure: PROCTOSCOPY;  Surgeon: Ileana Roup, MD;  Location: [facility];  Service: General;;  . TONSILLECTOMY    . TUBAL LIGATION    . WISDOM TOOTH EXTRACTION      There were no vitals filed for this visit.  Subjective Assessment - 05/27/18 0807    Subjective  Exercises are going well.  They help alot.  No falls since last visit.    Pertinent History  Rectal cancer (surgery x 2) s/p chemo and radiation    Patient Stated Goals  Pt's goals for PT are to improve balance and stop having falls, to help with the weakness.    Currently in Pain?  No/denies                       [facility] Adult PT Treatment/Exercise - 05/27/18 0815      Transfers  Transfers  Sit to Stand;Stand to Sit    Sit to Stand  From bed;Without upper extremity assist;6: Modified independent (Device/Increase time)   22", then 25" height   Stand to Sit  Without upper extremity assist;To bed;6: Modified independent (Device/Increase time)    Comments  From 22" and 25" inch mat height, holding weigthed ball with ball lifts upon standing (no UE support to stand), 2 sets x 5 reps.  Attempted sit<>stand from 16" chair, with rocking for momentum, x 3 reps and pt requires UE support      High Level Balance   High Level Balance Comments  In corner on solid surface and foam surface:  feet apart, feet together-EO/EC with head turns and head  nods x 5 reps each with supervision/min guard in corner for balance work      Exercises   Exercises  Lumbar;Knee/Hip      Lumbar Exercises: Quadruped   Single Arm Raise  Right;Left;5 reps;3 seconds    Straight Leg Raise  5 reps;3 seconds    Opposite Arm/Leg Raise  Right arm/Left leg;Left arm/Right leg;3 seconds   3 reps     Knee/Hip Exercises: Standing   Lateral Step Up  Right;Left;1 set;10 reps;Hand Hold: 1;Step Height: 4"    Lateral Step Up Limitations  Side step up/up-down/down from aerobic step, 5 reps R and L    Forward Step Up  Right;Left;1 set;10 reps;Hand Hold: 1;Step Height: 4"    Step Down  Right;Left;1 set;10 reps;Step Height: 4";Hand Hold: 1    Functional Squat  1 set;10 reps    Functional Squat Limitations  To touch elevated mat with buttocks each time      Knee/Hip Exercises: Seated   Sit to Sand  2 sets;5 reps;without UE support   from elevated mat, simulating bed height            PT Education - 05/27/18 1035    Education Details  Added to HEP-sit<>stand and squats to elevated surface (bed)-see instructions    Person(s) Educated  Patient    Methods  Explanation;Demonstration;Handout    Comprehension  Verbalized understanding;Returned demonstration       PT Short Term Goals - 05/13/18 0810      PT SHORT TERM GOAL #1   Title  Pt will be independent with HEP for improved strength, balance, and gait.  TARGET 05/10/18    Baseline  No current HEP     Time  2    Period  Weeks    Status  Achieved      PT SHORT TERM GOAL #2   Title  Pt will verbalize understanding of fall prevention in home envrionment.      Baseline  at fall risk per FGA; has had 3-4 falls in past 6 months    Time  2    Period  Weeks    Status  Achieved        PT Long Term Goals - 05/13/18 1237      PT LONG TERM GOAL #1   Title  Pt will perform 5x sit<>stand (modified with UE support as needed), in less than or equal to 12.5 seconds for decreased fall risk.  06/07/18    Baseline   with UE support, 15.68 sec    Time  6    Period  Weeks    Status  On-going      PT LONG TERM GOAL #2   Title  Pt will improve FGA score to at least 22/30  for decreased fall risk.    Baseline  19/30(Scores <22/30 indicates increased fall risk.)    Time  6    Period  Weeks    Status  On-going      PT LONG TERM GOAL #3   Title  Pt will negotiate at least 12 steps with handrail, with step-through pattern, for improved functional strength and stair negotiation.    Baseline  step-to pattern with rails    Time  6    Period  Weeks    Status  On-going      PT LONG TERM GOAL #4   Title  Pt will ambulate at least 1000 ft, indoor and outdoor surfaces, independently, no LOB, for improved community gait.    Baseline  120 ft mod I (slowed pace)    Time  6    Period  Weeks    Status  On-going            Plan - 05/27/18 1037    Clinical Impression Statement  Pt was last seen on 05/13/18-due to awaiting additional insurance approval, then limited appointment availability in clinic in conjunction with pt's work schedule.  Pt returns today, with no reported falls and reporting improved ability for stair negotiation.  She is able to complete full sets of exercises today without any episode of R knee buckling or reported near buckling.  Addressed strengthening of lower extremities, core, and balance activities in session today.  Pt will continue to benefit from skilled PT to further address strength and balance.    Rehab Potential  Good    Clinical Impairments Affecting Rehab Potential  history of radiation, peripheral neuropathy    PT Frequency  Other (comment)   3 visits over 2 weeks, then 2x/wk for 4 weeks (6 weeks POC)   PT Duration  6 weeks    PT Treatment/Interventions  ADLs/Self Care Home Management;DME Instruction;Balance training;Therapeutic exercise;Therapeutic activities;Functional mobility training;Gait training;Neuromuscular re-education;Stair training;Patient/family education    PT  Next Visit Plan  Review HEP, continue to progress lower extremity strengthening, core stability, corner balance and compliant surfaces    PT Home Exercise Plan  [email or web address] (MedBridge)    Consulted and Agree with Plan of Care  Patient       Patient will benefit from skilled therapeutic intervention in order to improve the following deficits and impairments:  Abnormal gait, Decreased balance, Decreased mobility, Decreased strength, Difficulty walking  Visit Diagnosis: Muscle weakness (generalized)  Unsteadiness on feet     Problem List Patient Active Problem List   Diagnosis Date Noted  . Rectal cancer ([place]) 07/26/2017  . Genetic testing 05/08/2017  . Family history of breast cancer   . 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 05/27/2018, 10:41 AM  Frazier Butt., PT   [facility] [address] [place], [place], [address] Phone: [phone]   Fax:  [phone]  Name: Deniss Wormley MRN: [id number] Date of Birth: Nov 02, 1975

## 2018-05-29 ENCOUNTER — Ambulatory Visit: Payer: BLUE CROSS/BLUE SHIELD

## 2018-06-04 ENCOUNTER — Ambulatory Visit: Payer: BLUE CROSS/BLUE SHIELD | Admitting: Physical Therapy

## 2018-06-04 ENCOUNTER — Encounter: Payer: Self-pay | Admitting: Physical Therapy

## 2018-06-04 DIAGNOSIS — M6281 Muscle weakness (generalized): Secondary | ICD-10-CM | POA: Diagnosis not present

## 2018-06-04 DIAGNOSIS — R2681 Unsteadiness on feet: Secondary | ICD-10-CM

## 2018-06-04 NOTE — Therapy (Signed)
[facility] [address] [place], [place], [address] Phone: [phone]   Fax:  [phone]  Physical Therapy Treatment  Patient Details  Name: Donna Brandt MRN: [id number] Date of Birth: Oct 03, 1975 Referring Provider (PT): Ellouise Newer   Encounter Date: 06/04/2018  PT End of Session - 06/04/18 1248    Visit Number  7    Number of Visits  12    Date for PT Re-Evaluation  07/24/18    Authorization Type  BCBS-Medicaid-Medicaid authorized 4 visits 05/03/18-05/16/18; 8 visits approved 05/17/18-06/13/18    Authorization Time Period  05/03/18-05/16/18; 05/17/18-06/13/18    Authorization - Visit Number  2   Additional approval period-see above   Authorization - Number of Visits  8    PT Start Time  1155    PT Stop Time  1233    PT Time Calculation (min)  38 min    Activity Tolerance  Patient tolerated treatment well    Behavior During Therapy  [facility] for tasks assessed/performed       Past Medical History:  Diagnosis Date  . Anxiety   . Colon cancer ([place])    rectal  . Complication of anesthesia    during colonoscopy and wisdom teeth extraction  . Family history of breast cancer   . HTN (hypertension)   . Migraine     Past Surgical History:  Procedure Laterality Date  . ABDOMINAL HYSTERECTOMY     due to uterine fibroids  . AUGMENTATION MAMMAPLASTY Bilateral   . FLEXIBLE SIGMOIDOSCOPY N/A 04/12/2017   Procedure: FLEXIBLE SIGMOIDOSCOPY;  Surgeon: Milus Banister, MD;  [facility];  Service: Endoscopy;  Laterality: N/A;  . FLEXIBLE SIGMOIDOSCOPY N/A 07/18/2017   Procedure: FLEXIBLE SIGMOIDOSCOPY EXAM UNDER ANESTHESIA;  Surgeon: Ileana Roup, MD;  Location: [facility];  Service: General;  Laterality: N/A;  . FLEXIBLE SIGMOIDOSCOPY  07/26/2017   Procedure: FLEXIBLE SIGMOIDOSCOPY;  Surgeon: Ileana Roup, MD;  [facility]: [facility];  Service: General;;  . FLEXIBLE SIGMOIDOSCOPY N/A 10/19/2017   Procedure: FLEXIBLE SIGMOIDOSCOPY;  Surgeon: Ileana Roup, MD;  Location: [facility];  Service: General;  Laterality: N/A;  . ILEOSTOMY N/A 11/28/2017   Procedure: CLOSURE OF LOOP ILEOSTOMY ERAS PATHWAY;  Surgeon: Ileana Roup, MD;  [facility];  Service: General;  Laterality: N/A;  . LAPAROSCOPIC LOW ANTERIOR RESECTION N/A 07/26/2017   Procedure: LAPAROSCOPIC VS OPEN  LOW ANTERIOR RESECTION WITH DIVERITING LOOP ILEOSTOMY;  Surgeon: Ileana Roup, MD;  Location: [facility];  Service: General;  Laterality: N/A;  . PROCTOSCOPY  07/26/2017   Procedure: PROCTOSCOPY;  Surgeon: Ileana Roup, MD;  [facility];  Service: General;;  . TONSILLECTOMY    . TUBAL LIGATION    . WISDOM TOOTH EXTRACTION      There were no vitals filed for this visit.  Subjective Assessment - 06/04/18 1156    Subjective  Have only had one fall since last visit-it seems to happen when I'm doing steps on days that I work.  Decided to stop taking some of my medications-Lyrica and B6, B12 and feel stronger without them    Pertinent History  Rectal cancer (surgery x 2) s/p chemo and radiation    Patient Stated Goals  Pt's goals for PT are to improve balance and stop having falls, to help with the weakness.    Currently in Pain?  No/denies  [facility] Adult PT Treatment/Exercise - 06/04/18 0001      Transfers   Transfers  Sit to Stand;Stand to Sit    Sit to Stand  From bed;Without upper extremity assist;6: Modified independent (Device/Increase time)   20" mat   Stand to Sit  Without upper extremity assist;To bed;6: Modified independent (Device/Increase time)    Number of Reps  --   5 reps from 20" height, then from lower/BOSU soft surface   Comments  From lower/BOSU surface, cues for rocking and use of UE support for sit<>stand (to simulate getting up from recliner)-pt able to perform without difficulty      Exercises   Other Exercises   Seated core  stability exercises on therapy ball:  abdominal activation with alternating UE lifts, bilateral UE lifts, bilateral UE trunk rotation, then marching in place x 10, then LAQ 2 sets x 5 reps; alternating UE/LE lifts x 5 reps      Knee/Hip Exercises: Standing   Terminal Knee Extension  Strengthening;Right;10 reps;Theraband;3 sets   green theraband, 3 positions each leg   Theraband Level (Terminal Knee Extension)  Level 3 (Green)    Lateral Step Up  Right;Left;1 set;10 reps;Hand Hold: 1;Step Height: 4"       Reviewed HEP given last visit-pt reports doing from lower surfaces instead of bed height, as instructed.  Pt return demo sit<>stand from 20" mat surface in clinic.      PT Education - 06/04/18 1246    Education Details  Added terminal knee extension to HEP; educated patient to contact MD regarding stopping some medications; educated patient in need to go up and down steps with STEP-TO PATTERN (she has been instructed in this, and chooses to try step-through pattern to "get stronger'); advised in need to negotiate stairs most safely, especially on days when she has been standing long periods    Person(s) Educated  Patient    Methods  Explanation;Demonstration;Handout    Comprehension  Verbalized understanding;Returned demonstration;Verbal cues required       PT Short Term Goals - 05/13/18 0810      PT SHORT TERM GOAL #1   Title  Pt will be independent with HEP for improved strength, balance, and gait.  TARGET 05/10/18    Baseline  No current HEP     Time  2    Period  Weeks    Status  Achieved      PT SHORT TERM GOAL #2   Title  Pt will verbalize understanding of fall prevention in home envrionment.      Baseline  at fall risk per FGA; has had 3-4 falls in past 6 months    Time  2    Period  Weeks    Status  Achieved        PT Long Term Goals - 05/13/18 1237      PT LONG TERM GOAL #1   Title  Pt will perform 5x sit<>stand (modified with UE support as needed), in less than  or equal to 12.5 seconds for decreased fall risk.  06/07/18    Baseline  with UE support, 15.68 sec    Time  6    Period  Weeks    Status  On-going      PT LONG TERM GOAL #2   Title  Pt will improve FGA score to at least 22/30 for decreased fall risk.    Baseline  19/30(Scores <22/30 indicates increased fall risk.)    Time  6  Period  Weeks    Status  On-going      PT LONG TERM GOAL #3   Title  Pt will negotiate at least 12 steps with handrail, with step-through pattern, for improved functional strength and stair negotiation.    Baseline  step-to pattern with rails    Time  6    Period  Weeks    Status  On-going      PT LONG TERM GOAL #4   Title  Pt will ambulate at least 1000 ft, indoor and outdoor surfaces, independently, no LOB, for improved community gait.    Baseline  120 ft mod I (slowed pace)    Time  6    Period  Weeks    Status  On-going            Plan - 06/04/18 1249    Clinical Impression Statement  Pt has had one fall since last visit-descending steps with step-through pattern.  PT re-educated patient in importance of safely negotiating steps with STEP-TO Pattern, as she continues to get stronger to reduce risk of falls.  Address core stability and knee stregnthening exercises this visit.  Pt will continue to benefit from skilled PT to address strength, balance, gait for reduced fall risk.    Rehab Potential  Good    Clinical Impairments Affecting Rehab Potential  history of radiation, peripheral neuropathy    PT Frequency  Other (comment)   3 visits over 2 weeks, then 2x/wk for 4 weeks (6 weeks POC)   PT Duration  6 weeks    PT Treatment/Interventions  ADLs/Self Care Home Management;DME Instruction;Balance training;Therapeutic exercise;Therapeutic activities;Functional mobility training;Gait training;Neuromuscular re-education;Stair training;Patient/family education    PT Next Visit Plan  Review additions to HEP, continue to progress lower extremity  strengthening, core stability, corner balance and compliant surfaces-CHECK LTGs next week    PT Home Exercise Plan  [email or web address] ([place])    Consulted and Agree with Plan of Care  Patient       Patient will benefit from skilled therapeutic intervention in order to improve the following deficits and impairments:  Abnormal gait, Decreased balance, Decreased mobility, Decreased strength, Difficulty walking  Visit Diagnosis: Muscle weakness (generalized)  Unsteadiness on feet     Problem List Patient Active Problem List   Diagnosis Date Noted  . Rectal cancer ([place]) 07/26/2017  . Genetic testing 05/08/2017  . Family history of breast cancer   . 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 06/04/2018, 12:51 PM  Frazier Butt., PT   [facility] [address] [place], [place], [address] Phone: [phone]   Fax:  [phone]  Name: Donna Brandt MRN: [id number] Date of Birth: Aug 01, 1976

## 2018-06-04 NOTE — Patient Instructions (Addendum)
Knee Extension: Terminal - Standing (Single Leg)    Face anchor in shoulder width stance, band around knee. Allow tension of band to slightly bend knee. Pull leg back, straightening knee, in a controlled manner.  Hold for 3 seconds, then gently release. Repeat _10_ times per set. Repeat with other leg. Do _3_ sets per session.  (you will do this in 3 positions:  Feet shoulder width apart, one foot slightly ahead, then one foot slightly behind.)Do 5__ sessions per week. Anchor Height: Knee  [email or web address].[email or web address]   Copyright  VHI. All rights reserved.

## 2018-06-06 ENCOUNTER — Encounter: Payer: Self-pay | Admitting: Gastroenterology

## 2018-06-06 NOTE — Telephone Encounter (Signed)
Pt scheduled for colon on 07/16/18.

## 2018-06-10 ENCOUNTER — Ambulatory Visit: Payer: BLUE CROSS/BLUE SHIELD | Attending: Nurse Practitioner | Admitting: Physical Therapy

## 2018-06-10 ENCOUNTER — Encounter: Payer: Self-pay | Admitting: Physical Therapy

## 2018-06-10 DIAGNOSIS — M6281 Muscle weakness (generalized): Secondary | ICD-10-CM | POA: Diagnosis not present

## 2018-06-10 DIAGNOSIS — R2689 Other abnormalities of gait and mobility: Secondary | ICD-10-CM | POA: Insufficient documentation

## 2018-06-10 DIAGNOSIS — R2681 Unsteadiness on feet: Secondary | ICD-10-CM | POA: Insufficient documentation

## 2018-06-10 NOTE — Patient Instructions (Signed)
Access Code: [email or web address]  URL: [email or web address]  Date: 06/10/2018  Prepared by: Mady Haagensen   Exercises  Seated Hamstring Curl with Anchored Resistance - 5 reps - 2 sets - 1x daily - 4x weekly  Supine Bridge - 5 reps - 2 sets - 3 sec hold - 1x daily - 5x weekly  Supine Short Arc Quad - 5 reps - 2 sets - 3 sec hold - 1x daily - 5x weekly  Seated Ankle Pumps on Table - 5 reps - 2 sets - 1x daily - 5x weekly  Supine Quadricep Sets - 5 reps - 2 sets - 3 sec hold - 1x daily - 5x weekly  Seated Hamstring Set - 5 reps - 2 sets - 3 sec hold - 1x daily - 5x weekly  Sit to Stand - 10 reps - 2 sets - 1x daily - 7x weekly  Squat with Chair Touch - 10 reps - 2 sets - 1x daily - 7x weekly  Forward Step Up with Counter Support - 10 reps - 2 sets - 3 sec hold - 1x daily - 7x weekly  Lateral Step Up with Counter Support - 10 reps - 2 sets - 3 sec hold - 1x daily - 7x weekly  Forward Step Down with Heel Tap and Counter Support - 10 reps - 2 sets - 1x daily - 7x weekly  Lunge with Counter Support - 10 reps - 1-2 sets - 1x daily - 5x weekly

## 2018-06-10 NOTE — Therapy (Signed)
[facility] [address] [place], [place], [address] Phone: [phone]   Fax:  [phone]  Physical Therapy Treatment  Patient Details  Name: Donna Brandt MRN: [id number] Date of Birth: Apr 21, 1976 Referring Provider (PT): Ellouise Newer   Encounter Date: 06/10/2018  PT End of Session - 06/10/18 1200    Visit Number  8    Number of Visits  12    Date for PT Re-Evaluation  07/24/18    Authorization Type  BC[employer]-Medicaid-Medicaid authorized 4 visits 05/03/18-05/16/18; 8 visits approved 05/17/18-06/13/18    Authorization Time Period  05/03/18-05/16/18; 05/17/18-06/13/18    Authorization - Visit Number  3   Additional approval period-see above   Authorization - Number of Visits  8    PT Start Time  0850    PT Stop Time  0932    PT Time Calculation (min)  42 min    Activity Tolerance  Patient tolerated treatment well    Behavior During Therapy  [facility] for tasks assessed/performed       Past Medical History:  Diagnosis Date  . Anxiety   . Colon cancer ([place])    rectal  . Complication of anesthesia    during colonoscopy and wisdom teeth extraction  . Family history of breast cancer   . HTN (hypertension)   . Migraine     Past Surgical History:  Procedure Laterality Date  . ABDOMINAL HYSTERECTOMY     due to uterine fibroids  . AUGMENTATION MAMMAPLASTY Bilateral   . FLEXIBLE SIGMOIDOSCOPY N/A 04/12/2017   Procedure: FLEXIBLE SIGMOIDOSCOPY;  Surgeon: Milus Banister, MD;  Location: [facility];  Service: Endoscopy;  Laterality: N/A;  . FLEXIBLE SIGMOIDOSCOPY N/A 07/18/2017   Procedure: FLEXIBLE SIGMOIDOSCOPY EXAM UNDER ANESTHESIA;  Surgeon: Ileana Roup, MD;  Location: [facility];  Service: General;  Laterality: N/A;  . FLEXIBLE SIGMOIDOSCOPY  07/26/2017   Procedure: FLEXIBLE SIGMOIDOSCOPY;  Surgeon: Ileana Roup, MD;  Location: [facility];  Service: General;;  . FLEXIBLE SIGMOIDOSCOPY N/A 10/19/2017    Procedure: FLEXIBLE SIGMOIDOSCOPY;  Surgeon: Ileana Roup, MD;  Location: [facility];  Service: General;  Laterality: N/A;  . ILEOSTOMY N/A 11/28/2017   Procedure: CLOSURE OF LOOP ILEOSTOMY ERAS PATHWAY;  Surgeon: Ileana Roup, MD;  Location: [facility];  Service: General;  Laterality: N/A;  . LAPAROSCOPIC LOW ANTERIOR RESECTION N/A 07/26/2017   Procedure: LAPAROSCOPIC VS OPEN  LOW ANTERIOR RESECTION WITH DIVERITING LOOP ILEOSTOMY;  Surgeon: Ileana Roup, MD;  Location: [facility];  Service: General;  Laterality: N/A;  . PROCTOSCOPY  07/26/2017   Procedure: PROCTOSCOPY;  Surgeon: Ileana Roup, MD;  Location: [facility];  Service: General;;  . TONSILLECTOMY    . TUBAL LIGATION    . WISDOM TOOTH EXTRACTION      There were no vitals filed for this visit.  Subjective Assessment - 06/10/18 0852    Subjective  No falls since last visit.  Just tired this morning.  Worked late last night, so hopefully my legs will cooperate.    Pertinent History  Rectal cancer (surgery x 2) s/p chemo and radiation    Patient Stated Goals  Pt's goals for PT are to improve balance and stop having falls, to help with the weakness.    Currently in Pain?  No/denies         [facility] PT Assessment - 06/10/18 0001      Functional Gait  Assessment   Gait assessed   Yes    Gait  Level Surface  Walks 20 ft in less than 7 sec but greater than 5.5 sec, uses assistive device, slower speed, mild gait deviations, or deviates 6-10 in outside of the 12 in walkway width.   6.31   Change in Gait Speed  Able to smoothly change walking speed without loss of balance or gait deviation. Deviate no more than 6 in outside of the 12 in walkway width.    Gait with Horizontal Head Turns  Performs head turns smoothly with slight change in gait velocity (eg, minor disruption to smooth gait path), deviates 6-10 in outside 12 in walkway width, or uses an assistive device.    Gait with Vertical Head Turns  Performs task with  slight change in gait velocity (eg, minor disruption to smooth gait path), deviates 6 - 10 in outside 12 in walkway width or uses assistive device    Gait and Pivot Turn  Pivot turns safely within 3 sec and stops quickly with no loss of balance.    Step Over Obstacle  Is able to step over one shoe box (4.5 in total height) without changing gait speed. No evidence of imbalance.    Gait with Narrow Base of Support  Is able to ambulate for 10 steps heel to toe with no staggering.    Gait with Eyes Closed  Walks 20 ft, uses assistive device, slower speed, mild gait deviations, deviates 6-10 in outside 12 in walkway width. Ambulates 20 ft in less than 9 sec but greater than 7 sec.    Ambulating Backwards  Walks 20 ft, uses assistive device, slower speed, mild gait deviations, deviates 6-10 in outside 12 in walkway width.    Steps  Alternating feet, must use rail.    Total Score  23    FGA comment:  Improved from 19/30 last check                   [facility] Adult PT Treatment/Exercise - 06/10/18 0001      Transfers   Transfers  Sit to Stand;Stand to Sit    Sit to Stand  6: Modified independent (Device/Increase time);With upper extremity assist;With armrests;From toilet   1 armrest   Five time sit to stand comments   14.05    Stand to Sit  6: Modified independent (Device/Increase time);With upper extremity assist;With armrests;To chair/3-in-1      Ambulation/Gait   Stairs  Yes    Stairs Assistance  6: Modified independent (Device/Increase time)    Stair Management Technique  Two rails;Alternating pattern;Forwards    Number of Stairs  4   x 2   Height of Stairs  6      Self-Care   Self-Care  Other Self-Care Comments    Other Self-Care Comments   Discussed progress towards goals, POC, with possible d/c next visit.  Discussed pt's use of equipment at apartment gym-recommended not treadmill due to pt's fall hx of falls due to leg buckling.  Recommend pt try step ups at aerobic step in gym  with UE support.  Discussed ways to progress step up/step down activities.      Exercises   Exercises  Knee/Hip    Other Exercises   Verbally reviewed terminal knee extension-pt reports no difficulty at home.      Knee/Hip Exercises: Standing   Lateral Step Up  Right;Left;1 set;10 reps;Step Height: 4";Hand Hold: 0    Forward Step Up  Right;Left;1 set;10 reps;Hand Hold: 1;Step Height: 4"    Step Down  Right;Left;1 set;10 reps;Step Height: 4";Hand Hold: 1    Other Standing Knee Exercises  Lunge in place, x 5 reps each leg, then forward lunge step along counter 2 reps    Other Standing Knee Exercises  Sidestep along counter, then sidestep squat along counter, 1 rep each direction; cues for correct sequence for squat, as pt tends to let knees progress over toes (needs cues for "sit" technique for squatting)             PT Education - 06/10/18 1206    Education Details  Added step ups/step downs to HEP, lunges to HEP-instructed patient to try to do these in gym area at apartment complex using aerobic step    Person(s) Educated  Patient    Methods  Explanation;Demonstration;Handout    Comprehension  Verbalized understanding;Returned demonstration;Verbal cues required       PT Short Term Goals - 05/13/18 0810      PT SHORT TERM GOAL #1   Title  Pt will be independent with HEP for improved strength, balance, and gait.  TARGET 05/10/18    Baseline  No current HEP     Time  2    Period  Weeks    Status  Achieved      PT SHORT TERM GOAL #2   Title  Pt will verbalize understanding of fall prevention in home envrionment.      Baseline  at fall risk per FGA; has had 3-4 falls in past 6 months    Time  2    Period  Weeks    Status  Achieved        PT Long Term Goals - 06/10/18 7425      PT LONG TERM GOAL #1   Title  Pt will perform 5x sit<>stand (modified with UE support as needed), in less than or equal to 12.5 seconds for decreased fall risk.  06/07/18    Baseline  with UE  support, 15.68 sec; 14.05 seconds with minimal 1 UE support 06/10/18    Time  6    Period  Weeks    Status  On-going      PT LONG TERM GOAL #2   Title  Pt will improve FGA score to at least 22/30 for decreased fall risk.    Baseline  19/30(Scores <22/30 indicates increased fall risk.); 23/30 06/10/18    Time  6    Period  Weeks    Status  Achieved      PT LONG TERM GOAL #3   Title  Pt will negotiate at least 12 steps with handrail, with step-through pattern, for improved functional strength and stair negotiation.    Baseline  step-to pattern with rails    Time  6    Period  Weeks    Status  Partially Met      PT LONG TERM GOAL #4   Title  Pt will ambulate at least 1000 ft, indoor and outdoor surfaces, independently, no LOB, for improved community gait.    Baseline  120 ft mod I (slowed pace);     Time  6    Period  Weeks    Status  On-going            Plan - 06/10/18 1201    Clinical Impression Statement  Began assessing LTGs this visit, with pt meeting LTG 2 for FGA score of 23/30.  Pt has improved 5x sit<>stand with UE support, but has not yet met goal.  Pt is  progressing with improved functional strength and HEP continues to be updated; will renew PT to capture this week of PT visits and plan for d/c next visit.    Rehab Potential  Good    Clinical Impairments Affecting Rehab Potential  history of radiation, peripheral neuropathy    PT Frequency  2x / week    PT Duration  Other (comment)    1week-recert 32/3/55   PT Treatment/Interventions  ADLs/Self Care Home Management;DME Instruction;Balance training;Therapeutic exercise;Therapeutic activities;Functional mobility training;Gait training;Neuromuscular re-education;Stair training;Patient/family education    PT Next Visit Plan  Review HEP as needed; discuss walking program as part of HEP; discuss and plan for d/c next visit    [place]  [id number] ([place])    Consulted and Agree with Plan of Care  Patient        Patient will benefit from skilled therapeutic intervention in order to improve the following deficits and impairments:  Abnormal gait, Decreased balance, Decreased mobility, Decreased strength, Difficulty walking  Visit Diagnosis: Muscle weakness (generalized)  Unsteadiness on feet  Other abnormalities of gait and mobility     Problem List Patient Active Problem List   Diagnosis Date Noted  . Rectal cancer ([place]) 07/26/2017  . Genetic testing 05/08/2017  . Family history of breast cancer   . 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 06/10/2018, 12:07 PM  Frazier Butt., PT   [facility] [address] [place], [place], [address] Phone: [phone]   Fax:  [phone]  Name: Emilly Lavey MRN: [id number] Date of Birth: [birth year]  PT Long Term Goals - 06/10/18 0854      PT LONG TERM GOAL #1   Title  Pt will perform 5x sit<>stand (modified with UE support as needed), in less than or equal to 12.5 seconds for decreased fall risk.  06/17/18-UPDATED TARGET    Baseline  with UE support, 15.68 sec; 14.05 seconds with minimal 1 UE support 06/10/18    Time  1   per recert 70/3/50   Period  Weeks    Status  On-going    Target Date  06/17/18      PT LONG TERM GOAL #2   Title  Pt will improve FGA score to at least 22/30 for decreased fall risk.    Baseline  19/30(Scores <22/30 indicates increased fall risk.); 23/30 06/10/18    Time  6    Period  Weeks    Status  Achieved      PT LONG TERM GOAL #3   Title  Pt will negotiate at least 12 steps with handrail, with step-through pattern, for improved functional strength and stair negotiation.    Baseline  step-to pattern with rails    Time  1    Period  Weeks    Status  Partially Met    Target Date  06/17/18      PT LONG TERM GOAL #4   Title  Pt will ambulate at least 1000 ft, indoor and outdoor surfaces, independently, no LOB, for improved  community gait.    Baseline  120 ft mod I (slowed pace);     Time  1    Period  Weeks    Status  On-going    Target Date  06/17/18      Note extension of LTG 1, 3, 4 to capture this week of PT appointment (recert completed and sent to MD today).  Mady Haagensen, PT 06/10/18 12:13 PM Phone: [phone]  Fax: [phone]

## 2018-06-11 ENCOUNTER — Telehealth: Payer: Self-pay | Admitting: Neurology

## 2018-06-11 NOTE — Telephone Encounter (Signed)
Dr Bailar is leaving for a new job closer to home we have mailed a letter to the patient to inform them that we canceled all appointments with Dr Bailar. We thank you for the understanding °

## 2018-06-12 ENCOUNTER — Telehealth: Payer: Self-pay | Admitting: Physical Therapy

## 2018-06-12 ENCOUNTER — Ambulatory Visit: Payer: BLUE CROSS/BLUE SHIELD | Admitting: Physical Therapy

## 2018-06-12 ENCOUNTER — Encounter: Payer: Self-pay | Admitting: Physical Therapy

## 2018-06-12 DIAGNOSIS — M6281 Muscle weakness (generalized): Secondary | ICD-10-CM

## 2018-06-12 DIAGNOSIS — R2689 Other abnormalities of gait and mobility: Secondary | ICD-10-CM

## 2018-06-12 DIAGNOSIS — M62838 Other muscle spasm: Secondary | ICD-10-CM

## 2018-06-12 DIAGNOSIS — R2681 Unsteadiness on feet: Secondary | ICD-10-CM

## 2018-06-12 DIAGNOSIS — R278 Other lack of coordination: Secondary | ICD-10-CM

## 2018-06-12 NOTE — Telephone Encounter (Signed)
Meagen pls see message below and send order, thanks!

## 2018-06-12 NOTE — Telephone Encounter (Signed)
Dr. Delice Lesch, Donna Brandt has been working with physical therapy and is planning for discharge this visit.  PT is recommending single point cane for additional stability for gait, and patient is in agreement.  Could you please write order for single point cane for patient?  Thank you.  Mady Haagensen, PT

## 2018-06-12 NOTE — Patient Instructions (Addendum)
WALKING  Walking is a great form of exercise to increase your strength, endurance and overall fitness.  A walking program can help you start slowly and gradually build endurance as you go.  Everyone's ability is different, so each person's starting point will be different.  You do not have to follow them exactly.  The are just samples. You should simply find out what's right for you and stick to that program.   In the beginning, you'll start off walking 2-3 times a day for short distances.  As you get stronger, you'll be walking further at just 1-2 times per day.  A. You Can Walk For A Certain Length Of Time Each Day    Walk 5 minutes 3 times per day.  Increase 2 minutes every 3-4 days (3 times per day).  Work up to 10-15 minutes (1-2 times per day).   Example:   Day 1-2 5 minutes 3 times per day   Day 7-8 9-10 minutes 2-3 times per day   Day 13-14 10-15 minutes 1-2 times per day  B. You Can Walk For a Certain Distance Each Day     Distance can be substituted for time.    Example:   3 trips to mailbox (at road)   3 trips to corner of block   3 trips around the block

## 2018-06-13 NOTE — Telephone Encounter (Signed)
Order placed in system.    Amy - is there somewhere I should send order?

## 2018-06-13 NOTE — Therapy (Signed)
[place] [address] [place], [place], [address] Phone: [phone]   Fax:  [phone]  Physical Therapy Treatment  Patient Details  Name: Donna Brandt MRN: [id number] Date of Birth: July 31, 1976 Referring Provider (PT): Ellouise Newer   Encounter Date: 06/12/2018  PT End of Session - 06/13/18 1242    Visit Number  9    Number of Visits  12    Date for PT Re-Evaluation  07/24/18    Authorization Type  BCBS-Medicaid-Medicaid authorized 4 visits 05/03/18-05/16/18; 8 visits approved 05/17/18-06/13/18    Authorization Time Period  05/03/18-05/16/18; 05/17/18-06/13/18    Authorization - Visit Number  4   Additional approval period-see above   Authorization - Number of Visits  8    PT Start Time  0932    PT Stop Time  1008    PT Time Calculation (min)  36 min    Activity Tolerance  Patient tolerated treatment well    Behavior During Therapy  [facility] for tasks assessed/performed       Past Medical History:  Diagnosis Date  . Anxiety   . Colon cancer ([place])    rectal  . Complication of anesthesia    during colonoscopy and wisdom teeth extraction  . Family history of breast cancer   . HTN (hypertension)   . Migraine     Past Surgical History:  Procedure Laterality Date  . ABDOMINAL HYSTERECTOMY     due to uterine fibroids  . AUGMENTATION MAMMAPLASTY Bilateral   . FLEXIBLE SIGMOIDOSCOPY N/A 04/12/2017   Procedure: FLEXIBLE SIGMOIDOSCOPY;  Surgeon: Milus Banister, MD;  Location: [facility];  Service: Endoscopy;  Laterality: N/A;  . FLEXIBLE SIGMOIDOSCOPY N/A 07/18/2017   Procedure: FLEXIBLE SIGMOIDOSCOPY EXAM UNDER ANESTHESIA;  Surgeon: Ileana Roup, MD;  Location: [facility];  Service: General;  Laterality: N/A;  . FLEXIBLE SIGMOIDOSCOPY  07/26/2017   Procedure: FLEXIBLE SIGMOIDOSCOPY;  Surgeon: Ileana Roup, MD;  Location: [facility];  Service: General;;  . FLEXIBLE SIGMOIDOSCOPY N/A 10/19/2017   Procedure: FLEXIBLE SIGMOIDOSCOPY;  Surgeon: Ileana Roup, MD;  Location: [facility];  Service: General;  Laterality: N/A;  . ILEOSTOMY N/A 11/28/2017   Procedure: CLOSURE OF LOOP ILEOSTOMY ERAS PATHWAY;  Surgeon: Ileana Roup, MD;  Location: [facility];  Service: General;  Laterality: N/A;  . LAPAROSCOPIC LOW ANTERIOR RESECTION N/A 07/26/2017   Procedure: LAPAROSCOPIC VS OPEN  LOW ANTERIOR RESECTION WITH DIVERITING LOOP ILEOSTOMY;  Surgeon: Ileana Roup, MD;  Location: [facility];  Service: General;  Laterality: N/A;  . PROCTOSCOPY  07/26/2017   Procedure: PROCTOSCOPY;  Surgeon: Ileana Roup, MD;  Location: [facility];  Service: General;;  . TONSILLECTOMY    . TUBAL LIGATION    . WISDOM TOOTH EXTRACTION      There were no vitals filed for this visit.  Subjective Assessment - 06/12/18 0935    Subjective  No falls since last visit. Went to [employer] yesterday and walked, but my legs kept giving out-no falls.  Did not have a chance to try the exercises.    Pertinent History  Rectal cancer (surgery x 2) s/p chemo and radiation    Patient Stated Goals  Pt's goals for PT are to improve balance and stop having falls, to help with the weakness.    Currently in Pain?  No/denies                       [facility] Adult PT Treatment/Exercise -  06/13/18 0001      Transfers   Transfers  Sit to Stand;Stand to Sit    Sit to Stand  6: Modified independent (Device/Increase time);With upper extremity assist;From chair/3-in-1    Stand to Sit  6: Modified independent (Device/Increase time);With armrests;To chair/3-in-1      Ambulation/Gait   Ambulation/Gait  Yes    Ambulation/Gait Assistance  5: Supervision    Ambulation/Gait Assistance Details  Outdoor gait with pt slowing gait towards end of 1000 ft outdoors.  No overt LOB noted.    Ambulation Distance (Feet)  1000 Feet    Assistive device  None;Straight cane    Gait Pattern  Step-through pattern;Decreased step length -  right;Decreased step length - left    Ambulation Surface  Level;Indoor    Stairs  Yes    Stairs Assistance  6: Modified independent (Device/Increase time)    Stair Management Technique  Two rails;Alternating pattern;Forwards    Number of Stairs  4   3 reps   Height of Stairs  6    Pre-Gait Activities  Discussed walking program for exercise, with instructions to start at no greater than 5 minutes, 3x/day, with gradual increase in distance for improved stamina and funcitonal strength with gait.  Discussed possibility of cane as assistive device for improved stability, safety with gait, especially with longer distances.Requested cane order from Dr. Delice Lesch.    Gait Comments  Trial of gait in clinic 120 ft x 2 reps with cane, initial cues for cane sequencing; recommended cane use for longer distance gait.      Self-Care   Self-Care  Other Self-Care Comments    Other Self-Care Comments   Discussed pt's progress towards goals, her POC and plans for discharge this visit.  Discussed importance of consistency of performing HEP as well as not overdoing exercise, walking, activity, to try to avoid getting muscles beyond fatigue to avoid falls.  Recommend cane for longer distance gait.  Pt in agreement with d/c this visit.      Lumbar Exercises: Quadruped   Single Arm Raise  Right;Left;5 reps;3 seconds    Straight Leg Raise  5 reps;3 seconds    Opposite Arm/Leg Raise  Right arm/Left leg;Left arm/Right leg;3 seconds   3 reps      With lumbar stability exercises, cues for abdominal activation and steady trunk with arm/leg raises.       PT Education - 06/13/18 1236    Education Details  Walking program added to HEP; progress towards goals and plan for d/c this visit.    Person(s) Educated  Patient    Methods  Explanation;Handout    Comprehension  Verbalized understanding       PT Short Term Goals - 05/13/18 0810      PT SHORT TERM GOAL #1   Title  Pt will be independent with HEP for improved  strength, balance, and gait.  TARGET 05/10/18    Baseline  No current HEP     Time  2    Period  Weeks    Status  Achieved      PT SHORT TERM GOAL #2   Title  Pt will verbalize understanding of fall prevention in home envrionment.      Baseline  at fall risk per FGA; has had 3-4 falls in past 6 months    Time  2    Period  Weeks    Status  Achieved        PT Long Term Goals - 06/12/18  0936      PT LONG TERM GOAL #1   Title  Pt will perform 5x sit<>stand (modified with UE support as needed), in less than or equal to 12.5 seconds for decreased fall risk.  06/17/18-UPDATED TARGET    Baseline  with UE support, 15.68 sec; 14.05 seconds with minimal 1 UE support 06/10/18    Time  1   per recert 84/6/96   Period  Weeks    Status  Not Met      PT LONG TERM GOAL #2   Title  Pt will improve FGA score to at least 22/30 for decreased fall risk.    Baseline  19/30(Scores <22/30 indicates increased fall risk.); 23/30 06/10/18    Time  6    Period  Weeks    Status  Achieved      PT LONG TERM GOAL #3   Title  Pt will negotiate at least 12 steps with handrail, with step-through pattern, for improved functional strength and stair negotiation.    Baseline  step-to pattern with rails    Time  1    Period  Weeks    Status  Partially Met      PT LONG TERM GOAL #4   Title  Pt will ambulate at least 1000 ft, indoor and outdoor surfaces, independently, no LOB, for improved community gait.    Baseline  120 ft mod I (slowed pace); 1000 ft supervision    Time  1    Period  Weeks    Status  Partially Met            Plan - 06/13/18 1249    Clinical Impression Statement  Fully assessed LTGs this visit, wtih LTG not met (progress made with sit<>stand, but pt cont to need UE support), LTG 3 and 4 partially met-pt able to negotiate steps and 1000 ft of gait, just needs supervision for safety.  Pt has improved with FGA score (noted last visit), and has had no overt LOB or legs buckling in therapy  sessions, though she does report this happening outside of therapy.  Pt has been given progression of HEP, recommnedations for gait safety and fall prevention and is appropriate for discharge at this time.    Rehab Potential  Good    Clinical Impairments Affecting Rehab Potential  history of radiation, peripheral neuropathy    PT Frequency  2x / week    PT Duration  Other (comment)    1week-recert 29/5/28   PT Treatment/Interventions  ADLs/Self Care Home Management;DME Instruction;Balance training;Therapeutic exercise;Therapeutic activities;Functional mobility training;Gait training;Neuromuscular re-education;Stair training;Patient/family education    PT Next Visit Plan  Discharge this visit; request order from Dr. Delice Lesch for [facility] (per patient request)    PT Home Exercise Plan  [id number] ([place])    Consulted and Agree with Plan of Care  Patient       Patient will benefit from skilled therapeutic intervention in order to improve the following deficits and impairments:  Abnormal gait, Decreased balance, Decreased mobility, Decreased strength, Difficulty walking  Visit Diagnosis: Other abnormalities of gait and mobility  Muscle weakness (generalized)     Problem List Patient Active Problem List   Diagnosis Date Noted  . Rectal cancer ([place]) 07/26/2017  . Genetic testing 05/08/2017  . Family history of breast cancer   . 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 06/13/2018, 12:55 PM  Frazier Butt., PT   [place] [address]  [place], [place], [address] Phone: [phone]   Fax:  [phone]  Name: Kenidee Cregan MRN: [id number] Date of Birth: 04/08/1976   PHYSICAL THERAPY DISCHARGE SUMMARY  Visits from Start of Care: 9  Current functional level related to goals / functional outcomes: PT Long Term Goals - 06/12/18 0936      PT LONG TERM GOAL #1   Title  Pt will perform 5x sit<>stand  (modified with UE support as needed), in less than or equal to 12.5 seconds for decreased fall risk.  06/17/18-UPDATED TARGET    Baseline  with UE support, 15.68 sec; 14.05 seconds with minimal 1 UE support 06/10/18    Time  1   per recert 08/12/53   Period  Weeks    Status  Not Met      PT LONG TERM GOAL #2   Title  Pt will improve FGA score to at least 22/30 for decreased fall risk.    Baseline  19/30(Scores <22/30 indicates increased fall risk.); 23/30 06/10/18    Time  6    Period  Weeks    Status  Achieved      PT LONG TERM GOAL #3   Title  Pt will negotiate at least 12 steps with handrail, with step-through pattern, for improved functional strength and stair negotiation.    Baseline  step-to pattern with rails    Time  1    Period  Weeks    Status  Partially Met      PT LONG TERM GOAL #4   Title  Pt will ambulate at least 1000 ft, indoor and outdoor surfaces, independently, no LOB, for improved community gait.    Baseline  120 ft mod I (slowed pace); 1000 ft supervision    Time  1    Period  Weeks    Status  Partially Met      Pt has met 1 LTG and partially met 2 LTGs.  Pt did not met LTG for sit<>stand.     Remaining deficits: Strength, balance (improved)   Education / Equipment: Educated in [employer], fall prevention, walking program, use of cane for gait.  Plan: Patient agrees to discharge.  Patient goals were partially met. Patient is being discharged due to being pleased with the current functional level.  ?????  Mady Haagensen, PT 06/13/18 12:58 PM Phone: [phone] Fax: [phone]

## 2018-06-14 ENCOUNTER — Ambulatory Visit: Payer: BLUE CROSS/BLUE SHIELD | Admitting: Physical Therapy

## 2018-06-21 ENCOUNTER — Telehealth: Payer: Self-pay | Admitting: Neurology

## 2018-06-21 NOTE — Telephone Encounter (Signed)
Patient would like to speak with you today please regarding her falling. She uses a cane and feels her employer will not want her working with it. She is no longer doing PT now. Please Call. Thanks

## 2018-06-21 NOTE — Telephone Encounter (Signed)
Spoke with pt.  She states that she has completed PT since she is now able to do the exercises herself at home.  Pt states that since d/c PT she has fallen twice.  States that she is not allowed to use cane while at work and she does not want to take a chance of falling at work (pt works in [employer] (medical)).  Pt asking if she can have note written to take her out of work for 1 week, in the hopes that giving her legs some time time to rest will help her not be dependent on cane when she returns to work.  I advised that Dr. Delice Lesch is out of the office but that I will send message and return call when I have response, wither from Dr. Delice Lesch or another provider in the office.    Pt goes on to ask that Rx for cane be placed up front for her to pick up on Monday.  (Rx was written and signed last week - pls see previous phone encounter)

## 2018-06-24 NOTE — Telephone Encounter (Signed)
Spoke with pt.  Date and time below work for her.  Appointment scheduled.

## 2018-06-24 NOTE — Telephone Encounter (Signed)
Can she come in at 1pm tomorrow 11/19 Tues for an earlier follow-up? Thanks

## 2018-06-25 ENCOUNTER — Encounter: Payer: Self-pay | Admitting: Neurology

## 2018-06-25 ENCOUNTER — Telehealth: Payer: Self-pay | Admitting: Physical Therapy

## 2018-06-25 ENCOUNTER — Ambulatory Visit (INDEPENDENT_AMBULATORY_CARE_PROVIDER_SITE_OTHER): Payer: BLUE CROSS/BLUE SHIELD | Admitting: Neurology

## 2018-06-25 VITALS — BP 160/100 | HR 82 | Ht 66.0 in | Wt 177.0 lb

## 2018-06-25 DIAGNOSIS — R296 Repeated falls: Secondary | ICD-10-CM

## 2018-06-25 DIAGNOSIS — G629 Polyneuropathy, unspecified: Secondary | ICD-10-CM | POA: Diagnosis not present

## 2018-06-25 DIAGNOSIS — C20 Malignant neoplasm of rectum: Secondary | ICD-10-CM | POA: Diagnosis not present

## 2018-06-25 DIAGNOSIS — R292 Abnormal reflex: Secondary | ICD-10-CM | POA: Diagnosis not present

## 2018-06-25 MED ORDER — PREGABALIN 75 MG PO CAPS: ORAL_CAPSULE | ORAL | 5 refills | Status: DC

## 2018-06-25 NOTE — Patient Instructions (Addendum)
1. Schedule MRI cervical with and without contrast and MRI thoracic spine with and without contrast  2. Schedule lumbar puncture under fluoroscopy  We have sent a referral to [facility] for your MRI's and LUMBAR PUNCTURE and they will call you directly to schedule your appt. They are located at [place]. If you need to contact them directly please call [phone].    3. Recommend off work until July 08, 2018  4. Follow-up after tests, call for any changes

## 2018-06-25 NOTE — Telephone Encounter (Signed)
Left message on machine, stating that PT has received order for cane from Dr. Delice Lesch, printed out and has been left at our front desk for her to pick up.  Mady Haagensen, PT 06/25/18 1:03 PM Phone: [phone] Fax: [phone]

## 2018-06-25 NOTE — Progress Notes (Signed)
NEUROLOGY FOLLOW UP OFFICE NOTE  Sabria Florido [id number] [birth year]  HISTORY OF PRESENT ILLNESS: I had the pleasure of seeing Niaja Stickley in follow-up in the neurology clinic on 06/25/2018.  The patient was last seen 2 months ago for bilateral leg numbness and weakness. She also reported memory changes. She has a history of rectal cancer s/p resection, radiation, and treatment with Xeloda. She stopped Xeloda in January 2019 with note of stability of paresthesias, but worsening weakness leading to several falls. She had an MRI lumbar spine without contrast which showed no evidence of metastatic disease, no stenosis or neural compression seen. 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 She had an MRI brain with and without contrast which was normal.  She has completed physical therapy for strengthening, balance, and gait for decreased fall risk, but continued to have recurrent falls. She mostly falls going down steps, when either knee would feel like it is giving way. She has to use a cane to walk. At the end of the day, her legs and feet are "done," she has difficulty going up steps and cannot feel her feet. She denies any paresthesias or weakness in her upper extremities. She denies any neck or back pain. She works for [employer] and [employer] where she [employer] and has to be on her feet without assistive devices.   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 She had an MRI lumbar spine in 08/2017 with no evidence of metastatic disease, stenosis, or neural  compression. There were fatty marrow changes from mid-L5 through the sacrum presumably secondary to previous radiation. Numbness had improved but she continued to report numbness in both soles. She states symptoms became more noticeable with pins and needles in her feet around December 2018. After her second surgery in April, she started noticing the paresthesias shooting up her legs above knee level. She was started on gabapentin but it did not help. She denied any burning/stabbing pain. She reports hands were unaffected. Since stopping Xeloda in January, the paresthesias do not go up to knee level anymore, mostly affecting her feet like she is walking on pebbles all the time, but now noticing leg weakness. She has difficulty going up stairs and has had falls because her legs would give out. She has fallen down stairs and fell in a bowling alley last August. She denies any neck or back pain. She denies any headaches, dizziness, diplopia, dysarthria/dysphagia. She has frequent bowel movements, no urinary issues. She works at [employer] and [employer] and stands all day. She has also been noticing memory issues. Cognitive issues were attributed to gabapentin, but she states she noticed it before starting medication, but it was more noticeable with the gabapentin. She has difficulty remembering names, completing sentences, remembering what she was supposed to do. She compensates by taking a lot of notes at work. She denies getting lost driving and uses her GPS all the time. She lives with her boyfriend and 3 children, denies missing bill payments and medication. Her boyfriend notes she repeats herself when she gets distracted. They both note increased  irritability and anxiety with stairs. She denies any family history of memory issues or neuropathy, no history of concussions or alcohol intake  PAST MEDICAL HISTORY: Past Medical History:  Diagnosis Date  . Anxiety   . Colon cancer ([place])    rectal  . Complication of  anesthesia    during colonoscopy and wisdom teeth extraction  . Family history of breast cancer   . HTN (hypertension)   . Migraine     MEDICATIONS: Current Outpatient Medications on File Prior to Visit  Medication Sig Dispense Refill  . AMBULATORY NON FORMULARY MEDICATION 3 in 1 commode 1 Device 0  . amLODipine (NORVASC) 10 MG tablet Take 10 mg by mouth daily.    . [employer] FIBER GUMMIES PO Take 2 each by mouth daily.    . pregabalin (LYRICA) 75 MG capsule Take 2 caps at bedtime.  150 mg total. (Patient not taking: Reported on 06/04/2018) 60 capsule 5  . pyridOXINE (VITAMIN B-6) 100 MG tablet Take 100 mg by mouth daily.     . SUMAtriptan (IMITREX) 100 MG tablet Take 100 mg by mouth every 2 (two) hours as needed for migraine. May repeat once in 2 hours if needed  99  . traMADol (ULTRAM) 50 MG tablet Take 1 tablet (50 mg total) by mouth every 6 (six) hours as needed (pain not controlled with ibuprofen and tylenol). (Patient not taking: Reported on 06/04/2018) 15 tablet 0  . valACYclovir (VALTREX) 500 MG tablet Take 500 mg by mouth daily as needed (for break out).     . vitamin B-12 (CYANOCOBALAMIN) 1000 MCG tablet Take 1,000 mcg by mouth daily.     No current facility-administered medications on file prior to visit.     ALLERGIES: Allergies  Allergen Reactions  . Dilaudid [Hydromorphone Hcl] Itching  . Latex Rash and Other (See Comments)    Skin sensitivity     FAMILY HISTORY: Family History  Problem Relation Age of Onset  . Breast cancer Other 75    SOCIAL HISTORY: Social History   Socioeconomic History  . Marital status: Single    Spouse name: Not on file  . Number of children: Not on file  . Years of education: Not on file  . Highest education level: Not on file  Occupational History  . Not on file  Social Needs  . Financial resource strain: Not on file  . Food insecurity:    Worry: Not on file    Inability: Not on file  . Transportation needs:    Medical: Not on  file    Non-medical: Not on file  Tobacco Use  . Smoking status: Never Smoker  . Smokeless tobacco: Never Used  Substance and Sexual Activity  . Alcohol use: Yes    Alcohol/week: 1.0 standard drinks    Types: 1 Glasses of wine per week    Comment: in social settings  . Drug use: No  . Sexual activity: Yes  Lifestyle  . Physical activity:    Days per week: Not on file    Minutes per session: Not on file  . Stress: Not on file  Relationships  . Social connections:    Talks on phone: Not on file    Gets together: Not on file    Attends religious service: Not on file    Active member of club or organization: Not on file    Attends meetings of clubs or organizations: Not on file    Relationship status: Not on file  .  Intimate partner violence:    Fear of current or ex partner: Not on file    Emotionally abused: Not on file    Physically abused: Not on file    Forced sexual activity: Not on file  Other Topics Concern  . Not on file  Social History Narrative   Pt lives in single story home (on the second floor) with her 3 children and her significant other   Has bachelors degree   Works in Press photographer     REVIEW OF SYSTEMS: Constitutional: No fevers, chills, or sweats, no generalized fatigue, change in appetite Eyes: No visual changes, double vision, eye pain Ear, nose and throat: No hearing loss, ear pain, nasal congestion, sore throat Cardiovascular: No chest pain, palpitations Respiratory:  No shortness of breath at rest or with exertion, wheezes GastrointestinaI: No nausea, vomiting, diarrhea, abdominal pain, fecal incontinence Genitourinary:  No dysuria, urinary retention or frequency Musculoskeletal:  No neck pain, back pain Integumentary: No rash, pruritus, skin lesions Neurological: as above Psychiatric: No depression, insomnia, anxiety Endocrine: No palpitations, fatigue, diaphoresis, mood swings, change in appetite, change in weight, increased  thirst Hematologic/Lymphatic:  No anemia, purpura, petechiae. Allergic/Immunologic: no itchy/runny eyes, nasal congestion, recent allergic reactions, rashes  PHYSICAL EXAM: Vitals:   06/25/18 1334  BP: (!) 160/100  Pulse: 82  SpO2: 98%   General: No acute distress Head:  Normocephalic/atraumatic Neck: supple, no paraspinal tenderness, full range of motion Heart:  Regular rate and rhythm Lungs:  Clear to auscultation bilaterally Back: No paraspinal tenderness Skin/Extremities: No rash, no edema Neurological Exam: alert and oriented to person, place, and time. No aphasia or dysarthria. Fund of knowledge is appropriate.  Recent and remote memory are intact.  Attention and concentration are normal.    Able to name objects and repeat phrases. Cranial nerves: Pupils equal, round, reactive to light. Extraocular movements intact with no nystagmus. Visual fields full. Facial sensation intact. No facial asymmetry. Tongue, uvula, palate midline.  Motor: Bulk and tone normal, muscle strength 5/5 throughout with no pronator drift.  Sensation to all modalities on both UE, decreased cold and pin on right LE, decreased vibration and cold to ankles bilaterally. No extinction to double simultaneous stimulation.  Deep tendon reflexes brisk +2 on both UE, unable to elicit on both LE. Negative Hoffman sign, no clonus, toes mute. Finger to nose testing intact.  Gait narrow-based and steady, mild difficulty with tandem walk. Positive Romberg test.  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 EMG/NCV of both legs did not show evidence of a large fiber neuropathy, however test is not sensitive for small fiber  neuropathy. There was mild bilateral S1 radiculopathy. MRI lumbar spine did not show any evidence of stenosis or metastasis. MRI brain normal. She is having more frequent falls, despite doing physical therapy. With history of cancer, further evaluation needs to be done, we will image spine further with MRI cervical and thoracic spine with and without contrast. A lumbar puncture will be ordered to assess for nerve root inflammation. Continue Lyrica for the neuropathy, increase to 75mg  in AM, 150mg  in PM. Continue using cane for balance and HEP. Follow-up in 3 months, she knows to call for any changes.   Thank you for allowing me to participate  in her care.  Please do not hesitate to call for any questions or concerns.  The duration of this appointment visit was 30 minutes of face-to-face time with the patient.  Greater than 50% of this time was spent in counseling, explanation of diagnosis, planning of further management, and coordination of care.   Ellouise Newer, M.D.   CC: Ned Card, NP

## 2018-06-26 ENCOUNTER — Telehealth: Payer: Self-pay | Admitting: Neurology

## 2018-06-26 ENCOUNTER — Encounter: Payer: Self-pay | Admitting: Neurology

## 2018-06-26 ENCOUNTER — Other Ambulatory Visit: Payer: Self-pay

## 2018-06-26 ENCOUNTER — Telehealth: Payer: Self-pay

## 2018-06-26 DIAGNOSIS — G629 Polyneuropathy, unspecified: Secondary | ICD-10-CM

## 2018-06-26 DIAGNOSIS — R413 Other amnesia: Secondary | ICD-10-CM

## 2018-06-26 DIAGNOSIS — R296 Repeated falls: Secondary | ICD-10-CM

## 2018-06-26 NOTE — Telephone Encounter (Signed)
Letter printed and faxed to number below.

## 2018-06-26 NOTE — Telephone Encounter (Signed)
Patient needs a note that we gave her yesterday fax to her job  Fax number [phone]  Delila Spence  Employee  ID number [id number] needs to be on it as well

## 2018-06-26 NOTE — Telephone Encounter (Signed)
LP orders and tests placed in system.  LM with Angelita Ingles at [facility] advising of order

## 2018-06-26 NOTE — Telephone Encounter (Signed)
Confirmation of fax transmittal received

## 2018-06-26 NOTE — Telephone Encounter (Signed)
-----   Message from Cameron Sprang, MD sent at 06/26/2018  3:02 PM EST ----- Regarding: LP orders For LP, pls order   CSF cell count and diff CSF protein and glucose CSF IgG index CSF oligoclonal bands CSF myelin basic protein CSF flow cytology CSF ACE  Thank you!!

## 2018-06-28 ENCOUNTER — Telehealth: Payer: Self-pay

## 2018-06-28 DIAGNOSIS — Z029 Encounter for administrative examinations, unspecified: Secondary | ICD-10-CM

## 2018-06-28 NOTE — Telephone Encounter (Signed)
Received VM from pt asking for return call as she would like to let me know about upcoming appointments and asks that her leave from work be extended to December 12th.    Returned call to pt.  She states that her LP with [facility] is scheduled for December 3, and she was told to lay flat for 24 hour post LP.  She is also scheduled a f/u colonoscopy on December 10.  States that using FMLA depletes her PAL time and she knows that her employer will not allow her to make time-off requests so soon after taking an extended amount of time off.  For this reason she is asking that her FMLA be extended from December 2 to December 12 so that it will cover all her upcoming appointments.  Pt also asks to reschedule her December 4 appointment with Dr. Delice Lesch.  Appointment rescheduled to December 11 @ 1:00PM per Dr. Delice Lesch.

## 2018-07-02 ENCOUNTER — Ambulatory Visit (AMBULATORY_SURGERY_CENTER): Payer: Self-pay | Admitting: *Deleted

## 2018-07-02 VITALS — Ht 67.0 in | Wt 180.0 lb

## 2018-07-02 DIAGNOSIS — Z85048 Personal history of other malignant neoplasm of rectum, rectosigmoid junction, and anus: Secondary | ICD-10-CM

## 2018-07-02 MED ORDER — PEG 3350-KCL-NA BICARB-NACL 420 G PO SOLR: 4000.0000 mL | Freq: Once | ORAL | 0 refills | Status: AC

## 2018-07-02 NOTE — Progress Notes (Signed)
No egg or soy allergy  No intubation problems per pt  No diet medications taken  No home oxygen or hx of sleep apnea  No use of chewing tobacco/snuff

## 2018-07-07 ENCOUNTER — Ambulatory Visit
Admission: RE | Admit: 2018-07-07 | Discharge: 2018-07-07 | Disposition: A | Discharge status: Home / Self Care | Payer: BLUE CROSS/BLUE SHIELD | Source: Ambulatory Visit | Attending: Neurology | Admitting: Neurology

## 2018-07-07 DIAGNOSIS — R292 Abnormal reflex: Secondary | ICD-10-CM

## 2018-07-07 DIAGNOSIS — R296 Repeated falls: Secondary | ICD-10-CM

## 2018-07-07 DIAGNOSIS — C20 Malignant neoplasm of rectum: Secondary | ICD-10-CM

## 2018-07-07 DIAGNOSIS — G629 Polyneuropathy, unspecified: Secondary | ICD-10-CM

## 2018-07-07 MED ORDER — GADOBENATE DIMEGLUMINE 529 MG/ML IV SOLN
15.0000 mL | Freq: Once | INTRAVENOUS | Status: AC | PRN
  Administered 2018-07-07: 15 mL via INTRAVENOUS

## 2018-07-09 ENCOUNTER — Ambulatory Visit
Admission: RE | Admit: 2018-07-09 | Discharge: 2018-07-09 | Disposition: A | Discharge status: Home / Self Care | Payer: BLUE CROSS/BLUE SHIELD | Source: Ambulatory Visit | Attending: Neurology | Admitting: Neurology

## 2018-07-09 DIAGNOSIS — G629 Polyneuropathy, unspecified: Secondary | ICD-10-CM

## 2018-07-09 DIAGNOSIS — R413 Other amnesia: Secondary | ICD-10-CM

## 2018-07-09 DIAGNOSIS — R296 Repeated falls: Secondary | ICD-10-CM

## 2018-07-09 NOTE — Progress Notes (Signed)
Blood obtained from pt's R AC for LP labs. Pt tolerated procedure well. Site is unremarkable.  

## 2018-07-09 NOTE — Discharge Instructions (Signed)

## 2018-07-10 ENCOUNTER — Ambulatory Visit: Payer: BLUE CROSS/BLUE SHIELD | Admitting: Neurology

## 2018-07-10 ENCOUNTER — Telehealth: Payer: Self-pay | Admitting: Neurology

## 2018-07-10 ENCOUNTER — Ambulatory Visit: Payer: BLUE CROSS/BLUE SHIELD

## 2018-07-10 NOTE — Telephone Encounter (Signed)
Spoke with patient regarding letter mailed (Dr. Si Raider leaving). Patient would prefer to wait to see the replacement for Dr Si Raider in our practice for cognitive testing. Patient does not care to go to [facility].

## 2018-07-15 LAB — CNS IGG SYNTHESIS RATE, CSF+BLOOD
Albumin Serum: 2.8 g/dL — ABNORMAL LOW (ref 3.5–5.2)
Albumin, CSF: 11.6 mg/dL (ref 8.0–42.0)
CNS-IgG Synthesis Rate: -2.7 mg/24 h (ref <=3.3)
IGG-INDEX: 0.48 (ref <0.66)
IgG (Immunoglobin G), Serum: 853 mg/dL (ref 600–1640)
IgG Total CSF: 1.7 mg/dL (ref 0.8–7.7)

## 2018-07-15 LAB — CSF CELL COUNT WITH DIFFERENTIAL
RBC Count, CSF: 2 cells/uL (ref 0–10)
WBC, CSF: 2 cells/uL (ref 0–5)

## 2018-07-15 LAB — MYELIN BASIC PROTEIN, CSF: Myelin Basic Protein: <2 mcg/L (ref 2.0–4.0)

## 2018-07-15 LAB — PROTEIN, CSF: Total Protein, CSF: 30 mg/dL (ref 15–45)

## 2018-07-15 LAB — GLUCOSE, CSF: Glucose, CSF: 68 mg/dL (ref 40–80)

## 2018-07-15 LAB — ANGIOTENSIN CONVERTING ENZYME, CSF: ACE, CSF: 15 U/L (ref <=15)

## 2018-07-15 LAB — OLIGOCLONAL BANDS, CSF + SERM

## 2018-07-16 ENCOUNTER — Encounter: Payer: Self-pay | Admitting: Gastroenterology

## 2018-07-16 ENCOUNTER — Ambulatory Visit (AMBULATORY_SURGERY_CENTER): Payer: BLUE CROSS/BLUE SHIELD | Admitting: Gastroenterology

## 2018-07-16 VITALS — BP 127/99 | HR 66 | Temp 98.4°F | Resp 18 | Ht 67.0 in | Wt 180.0 lb

## 2018-07-16 DIAGNOSIS — Z85048 Personal history of other malignant neoplasm of rectum, rectosigmoid junction, and anus: Secondary | ICD-10-CM

## 2018-07-16 HISTORY — PX: COLONOSCOPY: SHX174

## 2018-07-16 LAB — LEUKEMIA/LYMPHOMA EVALUATION PANEL

## 2018-07-16 MED ORDER — SODIUM CHLORIDE 0.9 % IV SOLN: 500.0000 mL | Freq: Once | INTRAVENOUS | Status: DC

## 2018-07-16 NOTE — Patient Instructions (Signed)
YOU HAD AN ENDOSCOPIC PROCEDURE TODAY AT THE [facility]:   Refer to the procedure report that was given to you for any specific questions about what was found during the examination.  If the procedure report does not answer your questions, please call your gastroenterologist to clarify.  If you requested that your care partner not be given the details of your procedure findings, then the procedure report has been included in a sealed envelope for you to review at your convenience later.  YOU SHOULD EXPECT: Some feelings of bloating in the abdomen. Passage of more gas than usual.  Walking can help get rid of the air that was put into your GI tract during the procedure and reduce the bloating. If you had a lower endoscopy (such as a colonoscopy or flexible sigmoidoscopy) you may notice spotting of blood in your stool or on the toilet paper. If you underwent a bowel prep for your procedure, you may not have a normal bowel movement for a few days.  Please Note:  You might notice some irritation and congestion in your nose or some drainage.  This is from the oxygen used during your procedure.  There is no need for concern and it should clear up in a day or so.  SYMPTOMS TO REPORT IMMEDIATELY:   Following lower endoscopy (colonoscopy or flexible sigmoidoscopy):  Excessive amounts of blood in the stool  Significant tenderness or worsening of abdominal pains  Swelling of the abdomen that is new, acute  Fever of 100F or higher  For urgent or emergent issues, a gastroenterologist can be reached at any hour by calling [phone].   DIET:  We do recommend a small meal at first, but then you may proceed to your regular diet.  Drink plenty of fluids but you should avoid alcoholic beverages for 24 hours.  ACTIVITY:  You should plan to take it easy for the rest of today and you should NOT DRIVE or use heavy machinery until tomorrow (because of the sedation medicines used during the test).     FOLLOW UP: Our staff will call the number listed on your records the next business day following your procedure to check on you and address any questions or concerns that you may have regarding the information given to you following your procedure. If we do not reach you, we will leave a message.  However, if you are feeling well and you are not experiencing any problems, there is no need to return our call.  We will assume that you have returned to your regular daily activities without incident.  If any biopsies were taken you will be contacted by phone or by letter within the next 1-3 weeks.  Please call us at [phone] if you have not heard about the biopsies in 3 weeks.    SIGNATURES/CONFIDENTIALITY: You and/or your care partner have signed paperwork which will be entered into your electronic medical record.  These signatures attest to the fact that that the information above on your After Visit Summary has been reviewed and is understood.  Full responsibility of the confidentiality of this discharge information lies with you and/or your care-partner. 

## 2018-07-16 NOTE — Op Note (Signed)
[facility] Patient Name: Donna Brandt Procedure Date: 07/16/2018 1:28 PM MRN: [id number] Endoscopist: Milus Banister , MD Age: 42 Referring MD:  Date of Birth: 1975/09/04 Gender: Female Account #: [id number] Procedure:                Colonoscopy Indications:              High risk colon cancer surveillance: Personal                            history of colon cancer; Rectal Adenocarcinoma                            diagnosed 2018, underwent neoadjuvant chemo/XRT and                            then LAR with ileostomy 2018, eventual ileostomy                            takedown. Medicines:                Monitored Anesthesia Care Procedure:                Pre-Anesthesia Assessment:                           - Prior to the procedure, a History and Physical                            was performed, and patient medications and                            allergies were reviewed. The patient's tolerance of                            previous anesthesia was also reviewed. The risks                            and benefits of the procedure and the sedation                            options and risks were discussed with the patient.                            All questions were answered, and informed consent                            was obtained. Prior Anticoagulants: The patient has                            taken no previous anticoagulant or antiplatelet                            agents. ASA Grade Assessment: II - A patient with  mild systemic disease. After reviewing the risks                            and benefits, the patient was deemed in                            satisfactory condition to undergo the procedure.                           After obtaining informed consent, the colonoscope                            was passed under direct vision. Throughout the                            procedure, the patient's blood pressure, pulse, and                         oxygen saturations were monitored continuously. The                            Colonoscope was introduced through the anus and                            advanced to the the cecum, identified by                            appendiceal orifice and ileocecal valve. The                            colonoscopy was performed without difficulty. The                            patient tolerated the procedure well. The quality                            of the bowel preparation was good. The ileocecal                            valve, appendiceal orifice, and rectum were                            photographed. Scope In: 1:37:53 PM Scope Out: 1:46:48 PM Scope Withdrawal Time: 0 hours 7 minutes 7 seconds  Total Procedure Duration: 0 hours 8 minutes 55 seconds  Findings:                 Distal rectum colo-colonic anastomosis was normal,                            located about 2cm from the anal verge.                           The colon (entire examined portion) appeared normal. Complications:            No immediate  complications. Estimated blood loss:                            None. Estimated Blood Loss:     Estimated blood loss: none. Impression:               - Distal rectum colo-colonic anastomosis was                            normal, located about 2cm from the anal verge.                           - Examination of the colon to the cecum was                            otherwise normal.                           - No polyps or cancers. Recommendation:           - Patient has a contact number available for                            emergencies. The signs and symptoms of potential                            delayed complications were discussed with the                            patient. Return to normal activities tomorrow.                            Written discharge instructions were provided to the                            patient.                           -  Resume previous diet.                           - Continue present medications.                           - Repeat colonoscopy in 3 years for surveillance. Milus Banister, MD 07/16/2018 1:52:43 PM This report has been signed electronically.

## 2018-07-16 NOTE — Progress Notes (Signed)
Pt's states no medical or surgical changes since previsit or office visit. 

## 2018-07-16 NOTE — Progress Notes (Signed)
Report to PACU, RN, vss, BBS= Clear.  

## 2018-07-17 ENCOUNTER — Telehealth: Payer: Self-pay

## 2018-07-17 ENCOUNTER — Encounter: Payer: Self-pay | Admitting: Neurology

## 2018-07-17 ENCOUNTER — Ambulatory Visit (INDEPENDENT_AMBULATORY_CARE_PROVIDER_SITE_OTHER): Payer: BLUE CROSS/BLUE SHIELD | Admitting: Neurology

## 2018-07-17 ENCOUNTER — Other Ambulatory Visit: Payer: Self-pay

## 2018-07-17 VITALS — BP 152/84 | HR 82 | Ht 67.0 in | Wt 180.0 lb

## 2018-07-17 DIAGNOSIS — R296 Repeated falls: Secondary | ICD-10-CM

## 2018-07-17 DIAGNOSIS — G629 Polyneuropathy, unspecified: Secondary | ICD-10-CM | POA: Diagnosis not present

## 2018-07-17 DIAGNOSIS — C20 Malignant neoplasm of rectum: Secondary | ICD-10-CM | POA: Diagnosis not present

## 2018-07-17 MED ORDER — BUTALBITAL-APAP-CAFFEINE 50-325-40 MG PO TABS: ORAL_TABLET | ORAL | 0 refills | Status: DC

## 2018-07-17 NOTE — Telephone Encounter (Signed)
Attempted to reach patient for post-procedure f/u call. This is the 2nd call. Call disconnected soon after patient answered the phone and unable to reach patient on subsequent call.

## 2018-07-17 NOTE — Patient Instructions (Addendum)
1. Take Fioricet twice a day for 3 days. If headaches continue, leave a message with [facility] at [phone]  on Monday and we will plan for a blood patch.  2. Continue with balance and strengthening exercises, cane for balance  3. We will work on Lyrica, take 75mg  in AM, 150mg  in PM  4. Follow-up in 6 months, call for any changes

## 2018-07-17 NOTE — Progress Notes (Signed)
NEUROLOGY FOLLOW UP OFFICE NOTE  Donna Brandt [id number] Dec 11, 1975  HISTORY OF PRESENT ILLNESS: I had the pleasure of seeing Donna Brandt in follow-up in the neurology clinic on 07/17/2018.  The patient was last seen a month ago for bilateral leg numbness and weakness, with an increase in falls. She also reported memory changes. She has a history of rectal cancer s/p resection, radiation, and treatment with Xeloda. She stopped Xeloda in January 2019 with note of stability of paresthesias, but worsening weakness leading to several falls. Imaging reviewed, she has had an MRI of the cervical, thoracic, and lumbar spine with no significant abnormalities seen. 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 She had an MRI brain with and without contrast which was normal.  She has had a lumbar puncture with normal CSF studies (normal cell count, protein, IgG, myelin basic protein, oligoclonal bands, ACE. Cytology done due to low cell viability.   She has had intermittent headaches since the lumbar puncture. She has a history of migraines where only going to the ER and Dilaudid has helped, the headaches have not come to that point, she starts taking off things on her head and lies down, and the headaches go away. She has had 3 episodes of significant back pain radiating to her abdomen, "like a contraction," which is similar to her symptoms when she was diagnosed with rectal cancer, but her recent colonoscopy was normal. She wonders if she is stressed. She has noticed that the leg weakness and knee giving way occur when she is walking constantly, such as in the grocery or at work. She reports falling off a ladder at work and injuring her right arm. She ambulates with a cane for longer distances.  History on Initial  Assessment 04/10/2018: This is a 42 year old right-handed woman with a history of hypertension, invasive adenocarcinoma of the rectum s/p resection and diverting ileostomy in 07/2017, radiation and Xeloda, who started having bilateral leg numbness with Xeloda was placed on hold in 08/2017. She had an MRI lumbar spine in 08/2017 with no evidence of metastatic disease, stenosis, or neural compression. There were fatty marrow changes from mid-L5 through the sacrum presumably secondary to previous radiation. Numbness had improved but she continued to report numbness in both soles. She states symptoms became more noticeable with pins and needles in her feet around December 2018. After her second surgery in April, she started noticing the paresthesias shooting up her legs above knee level. She was started on gabapentin but it did not help. She denied any burning/stabbing pain. She reports hands were unaffected. Since stopping Xeloda in January, the paresthesias do not go up to knee level anymore, mostly affecting her feet like she is walking on pebbles all the time, but now noticing leg weakness. She has difficulty going up stairs and has had falls because her legs would give out. She has fallen down stairs and fell in a bowling alley last August. She denies any neck or back pain. She denies any headaches, dizziness, diplopia, dysarthria/dysphagia. She has frequent bowel movements, no urinary issues. She works at [employer] and [employer] and stands all day. She has also been noticing memory issues. Cognitive issues were attributed to gabapentin, but she states she noticed it before starting medication, but it was more noticeable with the gabapentin. She  has difficulty remembering names, completing sentences, remembering what she was supposed to do. She compensates by taking a lot of notes at work. She denies getting lost driving and uses her GPS all the time. She lives with her boyfriend and 3 children, denies missing bill  payments and medication. Her boyfriend notes she repeats herself when she gets distracted. They both note increased irritability and anxiety with stairs. She denies any family history of memory issues or neuropathy, no history of concussions or alcohol intake  PAST MEDICAL HISTORY: Past Medical History:  Diagnosis Date  . Anemia   . Anxiety   . Colon cancer ([place])    rectal  . Complication of anesthesia    during colonoscopy and wisdom teeth extraction  . Family history of breast cancer   . HTN (hypertension)   . Migraine   . Stroke ([place])    sickle cell trait    MEDICATIONS: Current Outpatient Medications on File Prior to Visit  Medication Sig Dispense Refill  . Acetaminophen (TYLENOL PO) Take by mouth as needed.    . AMBULATORY NON FORMULARY MEDICATION 3 in 1 commode (Patient not taking: Reported on 07/16/2018) 1 Device 0  . amLODipine (NORVASC) 10 MG tablet Take 10 mg by mouth daily.    . [employer] FIBER GUMMIES PO Take 2 each by mouth daily.    . IBUPROFEN PO Take by mouth as needed.    . pregabalin (LYRICA) 75 MG capsule Take 1 cap in Am, 2 caps in PM 90 capsule 5  . pyridOXINE (VITAMIN B-6) 100 MG tablet Take 100 mg by mouth daily.     . SUMAtriptan (IMITREX) 100 MG tablet Take 100 mg by mouth every 2 (two) hours as needed for migraine. May repeat once in 2 hours if needed  99  . traMADol (ULTRAM) 50 MG tablet Take 1 tablet (50 mg total) by mouth every 6 (six) hours as needed (pain not controlled with ibuprofen and tylenol). (Patient not taking: Reported on 07/16/2018) 15 tablet 0  . valACYclovir (VALTREX) 500 MG tablet Take 500 mg by mouth daily as needed (for break out).     . vitamin B-12 (CYANOCOBALAMIN) 1000 MCG tablet Take 1,000 mcg by mouth daily.     No current facility-administered medications on file prior to visit.     ALLERGIES: Allergies  Allergen Reactions  . Dilaudid [Hydromorphone Hcl] Itching  . Latex Rash and Other (See Comments)    Skin sensitivity      FAMILY HISTORY: Family History  Problem Relation Age of Onset  . Breast cancer Other 75  . Colon cancer Neg Hx   . Esophageal cancer Neg Hx   . Rectal cancer Neg Hx   . Stomach cancer Neg Hx     SOCIAL HISTORY: Social History   Socioeconomic History  . Marital status: Single    Spouse name: Not on file  . Number of children: Not on file  . Years of education: Not on file  . Highest education level: Not on file  Occupational History  . Not on file  Social Needs  . Financial resource strain: Not on file  . Food insecurity:    Worry: Not on file    Inability: Not on file  . Transportation needs:    Medical: Not on file    Non-medical: Not on file  Tobacco Use  . Smoking status: Never Smoker  . Smokeless tobacco: Never Used  Substance and Sexual Activity  . Alcohol use: Yes  Alcohol/week: 1.0 standard drinks    Types: 1 Glasses of wine per week    Comment: in social settings  . Drug use: No  . Sexual activity: Yes  Lifestyle  . Physical activity:    Days per week: Not on file    Minutes per session: Not on file  . Stress: Not on file  Relationships  . Social connections:    Talks on phone: Not on file    Gets together: Not on file    Attends religious service: Not on file    Active member of club or organization: Not on file    Attends meetings of clubs or organizations: Not on file    Relationship status: Not on file  . Intimate partner violence:    Fear of current or ex partner: Not on file    Emotionally abused: Not on file    Physically abused: Not on file    Forced sexual activity: Not on file  Other Topics Concern  . Not on file  Social History Narrative   Pt lives in single story home (on the second floor) with her 3 children and her significant other   Has bachelors degree   Works in [employer]     REVIEW OF SYSTEMS: Constitutional: No fevers, chills, or sweats, no generalized fatigue, change in appetite Eyes: No visual changes, double vision,  eye pain Ear, nose and throat: No hearing loss, ear pain, nasal congestion, sore throat Cardiovascular: No chest pain, palpitations Respiratory:  No shortness of breath at rest or with exertion, wheezes GastrointestinaI: No nausea, vomiting, diarrhea, abdominal pain, fecal incontinence Genitourinary:  No dysuria, urinary retention or frequency Musculoskeletal:  No neck pain, back pain Integumentary: No rash, pruritus, skin lesions Neurological: as above Psychiatric: No depression, insomnia, anxiety Endocrine: No palpitations, fatigue, diaphoresis, mood swings, change in appetite, change in weight, increased thirst Hematologic/Lymphatic:  No anemia, purpura, petechiae. Allergic/Immunologic: no itchy/runny eyes, nasal congestion, recent allergic reactions, rashes  PHYSICAL EXAM: Vitals:   07/17/18 1320  BP: (!) 152/84  Pulse: 82  SpO2: 99%   General: No acute distress Head:  Normocephalic/atraumatic Neck: supple, no paraspinal tenderness, full range of motion Heart:  Regular rate and rhythm Lungs:  Clear to auscultation bilaterally Back: No paraspinal tenderness Skin/Extremities: No rash, no edema Neurological Exam: alert and oriented to person, place, and time. No aphasia or dysarthria. Fund of knowledge is appropriate.  Recent and remote memory are intact.  Attention and concentration are normal.    Able to name objects and repeat phrases. Cranial nerves: Pupils equal, round, reactive to light. Extraocular movements intact with no nystagmus. Visual fields full. Facial sensation intact. No facial asymmetry. Tongue, uvula, palate midline.  Motor: Bulk and tone normal, muscle strength 5/5 throughout with no pronator drift.  Sensation to all modalities on both UE, decreased cold and pin on right LE, decreased vibration and cold to ankles bilaterally (similar to prior). No extinction to double simultaneous stimulation.  Deep tendon reflexes brisk +2 on both UE, unable to elicit on both LE.  Negative Hoffman sign, no clonus, toes mute. Finger to nose testing intact.  Gait narrow-based and steady, mild difficulty with tandem walk. Positive Romberg test.  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 She has had an increase in falls with legs giving out.  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 EMG/NCV of both legs did not show evidence of a large fiber neuropathy, however test is not sensitive for small fiber neuropathy. We discussed doing a skin biopsy for small fiber neuropathy, but have agreed to hold off since this would not necessarily change management. She has had an extensive workup with imaging of the cervical, thoracic, and lumbar spine, as well as lumbar puncture, which have been unremarkable. Symptoms likely due to neuropathy. Continue Lyrica 75mg  in AM, 150mg  in PM. Continue home PT/balance exercises, use cane for balance. She has been having headaches since the lumbar puncture and will take Fioricet BID for 3 days, if headaches continue, blood patch will be ordered. 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 Follow-up in 6 months, she knows to call for any changes.   Thank you for allowing me to participate in her care.  Please do not hesitate to call for any questions or concerns.  The duration of this appointment visit was 30 minutes of face-to-face time with the patient.  Greater than 50% of this time was spent in counseling, explanation of diagnosis, planning of further management, and coordination of care.   Ellouise Newer, M.D.   CC: Ned Card, NP

## 2018-07-17 NOTE — Telephone Encounter (Signed)
NO ANSWER, MESSAGE LEFT FOR PATIENT. 

## 2018-07-18 ENCOUNTER — Telehealth: Payer: Self-pay

## 2018-07-18 ENCOUNTER — Encounter: Payer: Self-pay | Admitting: Neurology

## 2018-07-18 NOTE — Telephone Encounter (Signed)
Received VM from pt stating that she needs documentation stating that Dr. Delice Lesch does not think that pt's current job position is a good fit for pt given her medical conditions.  States that she needs to have this sent to her employer today, as she does not want it to appear that she is walking out of her job, but that she agrees with Dr. Amparo Bristol decision that she should not continue with her current position.    Pt goes on to states that her current handicap placard from oncologist is about to expire.  Asks for new one.  Paperwork filled out and placed on Dr. Amparo Bristol desk for [employer].    Dr. Delice Lesch - pls let me know when you are done with letter/statement and I will fax to pt's employer. Thanks!

## 2018-07-19 NOTE — Telephone Encounter (Signed)
done

## 2018-07-22 ENCOUNTER — Telehealth: Payer: Self-pay | Admitting: Neurology

## 2018-07-22 DIAGNOSIS — G971 Other reaction to spinal and lumbar puncture: Secondary | ICD-10-CM

## 2018-07-22 NOTE — Telephone Encounter (Signed)
Patient is calling in stating that the caffeine medication previously given did not work for her headaches. She is still having them and said there was a convo about maybe having her lumbar spinal tap redone? Please call her back at [phone]. Thanks!

## 2018-07-23 NOTE — Telephone Encounter (Signed)
Order placed for blood patch.  Called [place] [facility] to schedule.

## 2018-07-26 ENCOUNTER — Ambulatory Visit
Admission: RE | Admit: 2018-07-26 | Discharge: 2018-07-26 | Disposition: A | Discharge status: Home / Self Care | Payer: BLUE CROSS/BLUE SHIELD | Source: Ambulatory Visit | Attending: Neurology | Admitting: Neurology

## 2018-07-26 ENCOUNTER — Other Ambulatory Visit: Payer: BLUE CROSS/BLUE SHIELD

## 2018-07-26 DIAGNOSIS — G971 Other reaction to spinal and lumbar puncture: Secondary | ICD-10-CM

## 2018-07-26 MED ORDER — IOPAMIDOL (ISOVUE-M 200) INJECTION 41%
1.0000 mL | Freq: Once | INTRAMUSCULAR | Status: AC
  Administered 2018-07-26: 1 mL via EPIDURAL

## 2018-07-26 NOTE — Discharge Instructions (Signed)
Blood Patch Discharge Instructions  1. Go home and rest quietly for the next 24 hours.  It is important to lie flat for the next 24 hours.  Get up only to go to the restroom.  You may lie in the bed or on a couch on your back, your stomach, your left side or your right side.  You may have one pillow under your head.  You may have pillows between your knees while you are on your side or under your knees while you are on your back.  2. DO NOT drive today.  Recline the seat as far back as it will go, while still wearing your seat belt, on the way home.  3. You may get up to go to the bathroom as needed.  You may sit up for 10 minutes to eat.  You may resume your normal diet and medications unless otherwise indicated.  Drink lots of extra fluids today and tomorrow..   4. You may resume normal activities after your 24 hours of bed rest is over; however, do not exert yourself strongly or do any heavy lifting tomorrow.  5. Call your physician for a follow-up appointment.   6. If you have any questions  after you arrive home, please call [phone].

## 2018-08-13 ENCOUNTER — Ambulatory Visit
Admission: RE | Admit: 2018-08-13 | Discharge: 2018-08-13 | Disposition: A | Discharge status: Home / Self Care | Payer: BLUE CROSS/BLUE SHIELD | Source: Ambulatory Visit | Attending: Obstetrics and Gynecology | Admitting: Obstetrics and Gynecology

## 2018-08-13 DIAGNOSIS — Z1231 Encounter for screening mammogram for malignant neoplasm of breast: Secondary | ICD-10-CM

## 2018-08-27 DIAGNOSIS — H16223 Keratoconjunctivitis sicca, not specified as Sjogren's, bilateral: Secondary | ICD-10-CM | POA: Diagnosis not present

## 2018-08-29 ENCOUNTER — Telehealth: Payer: Self-pay | Admitting: Neurology

## 2018-08-29 NOTE — Telephone Encounter (Signed)
Patient said there is a mark on the [facility] form she was recently given and they wont accept it. Please give her a new completed form so she can turn it in. Please call her when ready and she will pick it up from office. Thanks!

## 2018-09-04 NOTE — Telephone Encounter (Signed)
Handicap placard application completed and placed at front desk

## 2018-09-17 ENCOUNTER — Encounter: Payer: BLUE CROSS/BLUE SHIELD | Admitting: Psychology

## 2018-10-05 DIAGNOSIS — H16223 Keratoconjunctivitis sicca, not specified as Sjogren's, bilateral: Secondary | ICD-10-CM | POA: Diagnosis not present

## 2018-11-19 ENCOUNTER — Telehealth: Payer: Self-pay | Admitting: Nurse Practitioner

## 2018-11-19 NOTE — Telephone Encounter (Signed)
R/s apt per 4/13 sch message - unable to reach patient . Left message for patient with appt date and time and mailed reminder letter

## 2018-11-21 ENCOUNTER — Ambulatory Visit: Payer: BLUE CROSS/BLUE SHIELD | Admitting: Nurse Practitioner

## 2018-11-21 ENCOUNTER — Other Ambulatory Visit: Payer: BLUE CROSS/BLUE SHIELD

## 2018-12-07 DIAGNOSIS — I10 Essential (primary) hypertension: Secondary | ICD-10-CM | POA: Diagnosis not present

## 2018-12-07 DIAGNOSIS — R51 Headache: Secondary | ICD-10-CM | POA: Diagnosis not present

## 2018-12-17 ENCOUNTER — Telehealth: Payer: Self-pay

## 2018-12-17 NOTE — Progress Notes (Deleted)
Per Lattie Haw canceled appointment for this Thursday 12/19/18 and sent scheduling message to have appointment pushed out 2 months. Left message for patient to call back [place] so that I can make him aware of the appointment change. Will try back again later.

## 2018-12-17 NOTE — Telephone Encounter (Signed)
TC per Lattie Haw to let patient know that appointment that was scheduled for tomorrow 12/19/18 is canceled and will be pushed out 2 months. I let her know that a scheduling message has been sent to the schedulers to reschedule appointment and that will give her a call when done to let her know new appointment date and time. No further problems or concerns at this time.

## 2018-12-17 NOTE — Telephone Encounter (Signed)
Per Lattie Haw canceled appointment for this Thursday 12/19/18 and sent scheduling message to have appointment pushed out 2 months. Left message for patient to call back [place] so that I can make her aware of the appointment change. Will try back again later.

## 2018-12-19 ENCOUNTER — Other Ambulatory Visit: Payer: Medicaid Other

## 2018-12-19 ENCOUNTER — Ambulatory Visit: Payer: Self-pay | Admitting: Nurse Practitioner

## 2019-01-15 ENCOUNTER — Ambulatory Visit: Payer: BLUE CROSS/BLUE SHIELD | Admitting: Neurology

## 2019-02-12 ENCOUNTER — Emergency Department (HOSPITAL_COMMUNITY): Payer: BC Managed Care – PPO

## 2019-02-12 ENCOUNTER — Ambulatory Visit (HOSPITAL_COMMUNITY)
Admission: EM | Admit: 2019-02-12 | Discharge: 2019-02-12 | Disposition: A | Discharge status: Home / Self Care | Payer: Medicaid Other | Attending: Urgent Care | Admitting: Urgent Care

## 2019-02-12 ENCOUNTER — Other Ambulatory Visit: Payer: Self-pay

## 2019-02-12 ENCOUNTER — Encounter (HOSPITAL_COMMUNITY): Payer: Self-pay | Admitting: Emergency Medicine

## 2019-02-12 ENCOUNTER — Observation Stay (HOSPITAL_COMMUNITY)
Admission: EM | Admit: 2019-02-12 | Discharge: 2019-02-14 | Disposition: A | Discharge status: Home / Self Care | Payer: BC Managed Care – PPO | Attending: Internal Medicine | Admitting: Internal Medicine

## 2019-02-12 DIAGNOSIS — Z20828 Contact with and (suspected) exposure to other viral communicable diseases: Secondary | ICD-10-CM | POA: Diagnosis not present

## 2019-02-12 DIAGNOSIS — I10 Essential (primary) hypertension: Secondary | ICD-10-CM | POA: Insufficient documentation

## 2019-02-12 DIAGNOSIS — R5383 Other fatigue: Secondary | ICD-10-CM

## 2019-02-12 DIAGNOSIS — C20 Malignant neoplasm of rectum: Secondary | ICD-10-CM

## 2019-02-12 DIAGNOSIS — R9431 Abnormal electrocardiogram [ECG] [EKG]: Secondary | ICD-10-CM

## 2019-02-12 DIAGNOSIS — Z79899 Other long term (current) drug therapy: Secondary | ICD-10-CM | POA: Insufficient documentation

## 2019-02-12 DIAGNOSIS — Z91040 Latex allergy status: Secondary | ICD-10-CM | POA: Diagnosis not present

## 2019-02-12 DIAGNOSIS — R0789 Other chest pain: Secondary | ICD-10-CM

## 2019-02-12 DIAGNOSIS — R0602 Shortness of breath: Secondary | ICD-10-CM | POA: Insufficient documentation

## 2019-02-12 DIAGNOSIS — R079 Chest pain, unspecified: Secondary | ICD-10-CM

## 2019-02-12 DIAGNOSIS — Z85048 Personal history of other malignant neoplasm of rectum, rectosigmoid junction, and anus: Secondary | ICD-10-CM | POA: Diagnosis not present

## 2019-02-12 LAB — BASIC METABOLIC PANEL
Anion gap: 12 (ref 5–15)
BUN: 8 mg/dL (ref 6–20)
CO2: 24 mmol/L (ref 22–32)
Calcium: 10.6 mg/dL — ABNORMAL HIGH (ref 8.9–10.3)
Chloride: 103 mmol/L (ref 98–111)
Creatinine, Ser: 0.56 mg/dL (ref 0.44–1.00)
GFR calc Af Amer: >60 mL/min (ref >60)
GFR calc non Af Amer: >60 mL/min (ref >60)
Glucose, Bld: 93 mg/dL (ref 70–99)
Potassium: 4 mmol/L (ref 3.5–5.1)
Sodium: 139 mmol/L (ref 135–145)

## 2019-02-12 LAB — CBC
HCT: 42.3 % (ref 36.0–46.0)
Hemoglobin: 14.1 g/dL (ref 12.0–15.0)
MCH: 25.8 pg — ABNORMAL LOW (ref 26.0–34.0)
MCHC: 33.3 g/dL (ref 30.0–36.0)
MCV: 77.3 fL — ABNORMAL LOW (ref 80.0–100.0)
Platelets: 215 10*3/uL (ref 150–400)
RBC: 5.47 MIL/uL — ABNORMAL HIGH (ref 3.87–5.11)
RDW: 13 % (ref 11.5–15.5)
WBC: 4.4 10*3/uL (ref 4.0–10.5)
nRBC: 0 % (ref 0.0–0.2)

## 2019-02-12 LAB — BRAIN NATRIURETIC PEPTIDE: B Natriuretic Peptide: 19.4 pg/mL (ref 0.0–100.0)

## 2019-02-12 LAB — D-DIMER, QUANTITATIVE: D-Dimer, Quant: 0.69 ug/mL-FEU — ABNORMAL HIGH (ref 0.00–0.50)

## 2019-02-12 LAB — TROPONIN I (HIGH SENSITIVITY)
Troponin I (High Sensitivity): <2 ng/L (ref <18)
Troponin I (High Sensitivity): <2 ng/L (ref <18)

## 2019-02-12 LAB — SARS CORONAVIRUS 2 BY RT PCR (HOSPITAL ORDER, PERFORMED IN ~~LOC~~ HOSPITAL LAB): SARS Coronavirus 2: NEGATIVE

## 2019-02-12 MED ORDER — ACETAMINOPHEN 325 MG PO TABS
650.0000 mg | ORAL_TABLET | ORAL | Status: DC | PRN
  Administered 2019-02-12 – 2019-02-13 (×2): 650 mg via ORAL
  Filled 2019-02-12 (×3): qty 2

## 2019-02-12 MED ORDER — HYDRALAZINE HCL 20 MG/ML IJ SOLN
5.0000 mg | Freq: Four times a day (QID) | INTRAMUSCULAR | Status: DC | PRN
  Administered 2019-02-12: 22:00:00 5 mg via INTRAVENOUS
  Filled 2019-02-12: qty 1

## 2019-02-12 MED ORDER — AMLODIPINE BESYLATE 10 MG PO TABS: 10.0000 mg | ORAL_TABLET | Freq: Every day | ORAL | 0 refills | Status: DC

## 2019-02-12 MED ORDER — ENOXAPARIN SODIUM 40 MG/0.4ML ~~LOC~~ SOLN
40.0000 mg | SUBCUTANEOUS | Status: DC
  Administered 2019-02-12 – 2019-02-13 (×2): 40 mg via SUBCUTANEOUS
  Filled 2019-02-12 (×2): qty 0.4

## 2019-02-12 MED ORDER — IOHEXOL 350 MG/ML SOLN
75.0000 mL | Freq: Once | INTRAVENOUS | Status: AC | PRN
  Administered 2019-02-12: 18:00:00 75 mL via INTRAVENOUS

## 2019-02-12 MED ORDER — ONDANSETRON HCL 4 MG/2ML IJ SOLN: 4.0000 mg | Freq: Four times a day (QID) | INTRAMUSCULAR | Status: DC | PRN

## 2019-02-12 MED ORDER — DIPHENHYDRAMINE HCL 25 MG PO CAPS
25.0000 mg | ORAL_CAPSULE | Freq: Once | ORAL | Status: AC
  Administered 2019-02-12: 17:00:00 25 mg via ORAL
  Filled 2019-02-12: qty 1

## 2019-02-12 MED ORDER — AMLODIPINE BESYLATE 10 MG PO TABS
10.0000 mg | ORAL_TABLET | Freq: Every day | ORAL | Status: DC
  Administered 2019-02-13 – 2019-02-14 (×2): 10 mg via ORAL
  Filled 2019-02-12 (×2): qty 1

## 2019-02-12 MED ORDER — SODIUM CHLORIDE 0.9% FLUSH
3.0000 mL | Freq: Once | INTRAVENOUS | Status: AC
  Administered 2019-02-13: 22:00:00 3 mL via INTRAVENOUS

## 2019-02-12 MED ORDER — METOCLOPRAMIDE HCL 10 MG PO TABS
10.0000 mg | ORAL_TABLET | Freq: Once | ORAL | Status: AC
  Administered 2019-02-12: 10 mg via ORAL
  Filled 2019-02-12: qty 1

## 2019-02-12 MED ORDER — AMLODIPINE BESYLATE 5 MG PO TABS
10.0000 mg | ORAL_TABLET | Freq: Once | ORAL | Status: AC
  Administered 2019-02-12: 19:00:00 10 mg via ORAL
  Filled 2019-02-12: qty 2

## 2019-02-12 NOTE — H&P (Signed)
History and Physical    Donna Brandt ZHG:[id number] DOB: Oct 17, 1975 DOA: 02/12/2019  PCP: Patient, No Pcp Per  Patient coming from: Urgent care  I have personally briefly reviewed patient's old medical records in [place]  Chief Complaint: Chest pain  HPI: Donna Brandt is a 43 y.o. female with medical history significant for hypertension and rectal cancer who presents to the ED for evaluation of chest pain.  Patient states she has been out of her home blood pressure medication (amlodipine) for 3 weeks as her primary care physician in [place] closed their practice.  Since then she has been having intermittent central sharp chest pain that may occur with rest or activity.  She denies any radiation to her arms, neck, jaw, or back however did note some numbness and tingling down her left arm while waiting in the ED.  She has been having a mild constant headache since she has been out of her blood pressure.  She has occasional shortness of breath during episodes of her chest pain.  She reports recent stress/anxiety at work due to 1 of her coworkers testing positive for COVID-19.  She otherwise denies any associated nausea, vomiting, diaphoresis, abdominal pain, dysuria, diarrhea, or constipation.  She says her mother died of a sudden heart attack at age 71.  She is a never smoker.  She drinks occasional wine.  She denies any illicit drug use.  ED Course:  Initial vitals showed BP 156/109, pulse 64, RR 16, SPO2 100% on room air.  Labs are notable for WBC 4.4, hemoglobin 14.1, platelets 215,000, potassium 4.0, BUN 8, creatinine 0.56, BNP 19.4, troponin I negative x2, d-dimer 0.69.  EKG showed Normal sinus rhythm with nonspecific T wave changes in lead III.  T wave changes slightly more pronounced when compared to prior in December 2018.  2 view chest x-ray showed mild cardiomegaly without focal consolidation, effusion, or pulmonary edema.  CTA chest was negative for PE.  Patient was  given oral amlodipine, Reglan, and Benadryl.  The hospitalist service was consulted to admit for chest pain rule out.  Review of Systems: All systems reviewed and are negative except as documented in history of present illness above.   Past Medical History:  Diagnosis Date  . Anemia   . Anxiety   . Colon cancer ([place])    rectal  . Complication of anesthesia    during colonoscopy and wisdom teeth extraction  . Family history of breast cancer   . HTN (hypertension)   . Migraine   . Stroke ([place])    sickle cell trait    Past Surgical History:  Procedure Laterality Date  . ABDOMINAL HYSTERECTOMY     due to uterine fibroids  . AUGMENTATION MAMMAPLASTY Bilateral   . FLEXIBLE SIGMOIDOSCOPY N/A 04/12/2017   Procedure: FLEXIBLE SIGMOIDOSCOPY;  Surgeon: Milus Banister, MD;  Location: [facility];  Service: Endoscopy;  Laterality: N/A;  . FLEXIBLE SIGMOIDOSCOPY N/A 07/18/2017   Procedure: FLEXIBLE SIGMOIDOSCOPY EXAM UNDER ANESTHESIA;  Surgeon: Ileana Roup, MD;  Location: [facility];  Service: General;  Laterality: N/A;  . FLEXIBLE SIGMOIDOSCOPY  07/26/2017   Procedure: FLEXIBLE SIGMOIDOSCOPY;  Surgeon: Ileana Roup, MD;  Location: [facility];  Service: General;;  . FLEXIBLE SIGMOIDOSCOPY N/A 10/19/2017   Procedure: FLEXIBLE SIGMOIDOSCOPY;  Surgeon: Ileana Roup, MD;  Location: [facility];  Service: General;  Laterality: N/A;  . ILEOSTOMY N/A 11/28/2017   Procedure: CLOSURE OF LOOP ILEOSTOMY ERAS PATHWAY;  Surgeon: Ileana Roup, MD;  Location:  [facility];  Service: General;  Laterality: N/A;  . ILEOSTOMY REVISION     11-28-17  . LAPAROSCOPIC LOW ANTERIOR RESECTION N/A 07/26/2017   Procedure: LAPAROSCOPIC VS OPEN  LOW ANTERIOR RESECTION WITH [place] LOOP ILEOSTOMY;  Surgeon: Ileana Roup, MD;  Location: [facility];  Service: General;  Laterality: N/A;  . PROCTOSCOPY  07/26/2017   Procedure: PROCTOSCOPY;  Surgeon: Ileana Roup, MD;  Location: [facility];   Service: General;;  . TONSILLECTOMY    . TUBAL LIGATION    . WISDOM TOOTH EXTRACTION      Social History:  reports that she has never smoked. She has never used smokeless tobacco. She reports current alcohol use of about 1.0 standard drinks of alcohol per week. She reports that she does not use drugs.  Allergies  Allergen Reactions  . Dilaudid [Hydromorphone Hcl] Itching  . Latex Rash and Other (See Comments)    Skin sensitivity     Family History  Problem Relation Age of Onset  . Heart attack Mother   . Breast cancer Other 75  . Colon cancer Neg Hx   . Esophageal cancer Neg Hx   . Rectal cancer Neg Hx   . Stomach cancer Neg Hx      Prior to Admission medications   Medication Sig Start Date End Date Taking? Authorizing Provider  Acetaminophen (TYLENOL PO) Take by mouth as needed.    [provider]  AMBULATORY NON FORMULARY MEDICATION 3 in 1 commode 05/03/18   Cameron Sprang, MD  amLODipine (NORVASC) 10 MG tablet Take 1 tablet (10 mg total) by mouth daily. 02/12/19   Jaynee Eagles, PA-C  [employer] F[employer] GUMMIES PO Take 2 each by mouth daily.    [provider]  IBUPROFEN PO Take by mouth as needed.    [provider]  vitamin B-12 (CYANOCOBALAMIN) 1000 MCG tablet Take 1,000 mcg by mouth daily.    [provider]  pregabalin (LYRICA) 75 MG capsule Take 1 cap in Am, 2 caps in PM 06/25/18 02/12/19  Cameron Sprang, MD    Physical Exam: Vitals:   02/12/19 1945 02/12/19 2000 02/12/19 2015 02/12/19 2115  BP: (!) 141/98 (!) 137/100 136/90 (!) 131/103  Pulse: 63 61 66 65  Resp:      Temp:      TempSrc:      SpO2: 99% 98% 100% 98%  Weight:      Height:        Constitutional: Resting supine in bed, NAD, calm, comfortable Eyes: PERRL, lids and conjunctivae normal ENMT: Mucous membranes are moist. Posterior pharynx clear of any exudate or lesions.Normal dentition.  Neck: normal, supple, no masses. Respiratory: clear to auscultation bilaterally, no  wheezing, no crackles. Normal respiratory effort. No accessory muscle use.  Cardiovascular: Regular rate and rhythm, no murmurs / rubs / gallops. No extremity edema. 2+ pedal pulses. Abdomen: no tenderness, no masses palpated. No hepatosplenomegaly. Bowel sounds positive.  Musculoskeletal: Tender to palpation along sternum.  No clubbing / cyanosis. No joint deformity upper and lower extremities. Good ROM, no contractures. Normal muscle tone.  Skin: no rashes, lesions, ulcers. No induration Neurologic: CN 2-12 grossly intact. Sensation intact, Strength 5/5 in all 4.  Psychiatric: Normal judgment and insight. Alert and oriented x 3. Normal mood.    Labs on Admission: I have personally reviewed following labs and imaging studies  CBC: Recent Labs  Lab 02/12/19 1255  WBC 4.4  HGB 14.1  HCT 42.3  MCV 77.3*  PLT 614   Basic Metabolic Panel: Recent Labs  Lab 02/12/19 1255  NA 139  K 4.0  CL 103  CO2 24  GLUCOSE 93  BUN 8  CREATININE 0.56  CALCIUM 10.6*   GFR: Estimated Creatinine Clearance: 100.3 mL/min (by C-G formula based on SCr of 0.56 mg/dL). Liver Function Tests: No results for input(s): AST, ALT, ALKPHOS, BILITOT, PROT, ALBUMIN in the last 168 hours. No results for input(s): LIPASE, AMYLASE in the last 168 hours. No results for input(s): AMMONIA in the last 168 hours. Coagulation Profile: No results for input(s): INR, PROTIME in the last 168 hours. Cardiac Enzymes: No results for input(s): CKTOTAL, CKMB, CKMBINDEX, TROPONINI in the last 168 hours. BNP (last 3 results) No results for input(s): PROBNP in the last 8760 hours. HbA1C: No results for input(s): HGBA1C in the last 72 hours. CBG: No results for input(s): GLUCAP in the last 168 hours. Lipid Profile: No results for input(s): CHOL, HDL, LDLCALC, TRIG, CHOLHDL, LDLDIRECT in the last 72 hours. Thyroid Function Tests: No results for input(s): TSH, T4TOTAL, FREET4, T3FREE, THYROIDAB in the last 72 hours. Anemia  Panel: No results for input(s): VITAMINB12, FOLATE, FERRITIN, TIBC, IRON, RETICCTPCT in the last 72 hours. Urine analysis:    Component Value Date/Time   LABSPEC 1.015 05/14/2017 0942   PHURINE 6.0 05/14/2017 0942   GLUCOSEU Negative 05/14/2017 0942   HGBUR Moderate 05/14/2017 0942   BILIRUBINUR Negative 05/14/2017 0942   KETONESUR Negative 05/14/2017 0942   PROTEINUR Negative 05/14/2017 0942   UROBILINOGEN 0.2 05/14/2017 0942   NITRITE Negative 05/14/2017 0942   LEUKOCYTESUR Negative 05/14/2017 0942    Radiological Exams on Admission: Dg Chest 2 View  Result Date: 02/12/2019 CLINICAL DATA:  43 year old female recently tested negative for COVID-19. Hypertensive chest pain. Headaches. EXAM: CHEST - 2 VIEW COMPARISON:  None. FINDINGS: Mild cardiomegaly. Other mediastinal contours are within normal limits. Visualized tracheal air column is within normal limits. Lung volumes are within normal limits. Both lungs appear clear. No pneumothorax or pleural effusion. No acute osseous abnormality identified. Negative visible bowel gas pattern. IMPRESSION: Mild cardiomegaly.  No acute cardiopulmonary abnormality. Electronically Signed   By: Genevie Ann M.D.   On: 02/12/2019 13:25   Ct Angio Chest Pe W And/or Wo Contrast  Result Date: 02/12/2019 CLINICAL DATA:  Shortness of breath and chest pain. Positive D-dimer study EXAM: CT ANGIOGRAPHY CHEST WITH CONTRAST TECHNIQUE: Multidetector CT imaging of the chest was performed using the standard protocol during bolus administration of intravenous contrast. Multiplanar CT image reconstructions and MIPs were obtained to evaluate the vascular anatomy. CONTRAST:  24mL OMNIPAQUE IOHEXOL 350 MG/ML SOLN COMPARISON:  Chest radiograph February 12, 2019 FINDINGS: Cardiovascular: There is no demonstrable pulmonary embolus. There is no thoracic aortic aneurysm or dissection. The visualized great vessels appear unremarkable. There is no pericardial effusion or pericardial thickening  evident. Mediastinum/Nodes: Thyroid appears unremarkable. There is no demonstrable thoracic adenopathy. No esophageal lesions are evident. Lungs/Pleura: There is no edema or consolidation. No pleural effusion evident. Upper Abdomen: Visualized upper abdominal structures appear unremarkable. Musculoskeletal: There are breast implants bilaterally. There are no blastic or lytic bone lesions. Review of the MIP images confirms the above findings. IMPRESSION: 1. No demonstrable pulmonary embolus. No thoracic aortic aneurysm or dissection. 2.  Lungs clear. 3.  No adenopathy evident. 4.  Breast implants bilaterally. Electronically Signed   By: Lowella Grip III M.D.   On: 02/12/2019 17:55    EKG: Independently reviewed. Normal sinus rhythm with nonspecific T  wave changes in lead III.  T wave changes slightly more pronounced when compared to prior in December 2018.  Assessment/Plan Principal Problem:   Chest pain Active Problems:   HTN (hypertension)  Donna Brandt is a 42 y.o. female with medical history significant for hypertension and rectal cancer who is admitted for chest pain rule out.   Atypical chest pain: Patient with atypical chest pain, likely from uncontrolled hypertension with possible stress/anxiety component.  She also has reproducible tenderness with palpation over her sternum.  Troponin is negative x2.  EKG shows nonspecific ST changes in lead III.  CTA is negative for PE.  Will monitor overnight, repeat EKG in a.m.  Consider inpatient versus outpatient stress testing.  Hypertension: Uncontrolled after being on home amlodipine for 3 weeks. -Restart amlodipine 10 mg daily -IV hydralazine as needed  Rectal cancer: Status post chemotherapy, radiation therapy, and resection.  Currently in remission.   DVT prophylaxis: Subcutaneous Lovenox Code Status: Full code, confirmed with patient Family Communication: None present on admission. Disposition Plan: Likely discharge to home in  1 day Consults called: None Admission status: Observation   Zada Finders MD [facility]  If 7PM-7AM, please contact night-coverage [email or web address]  02/12/2019, 9:29 PM

## 2019-02-12 NOTE — ED Provider Notes (Addendum)
MRN: [id number] DOB: 1975-12-17  Subjective:   Donna Brandt is a 43 y.o. female presenting for 2 week history of persistent daily moderate to severe chest pain. Sx started in her sleep, had severe episode of mid-sternal chest pain that woke her out of her sleep, radiates to her back. Has since had persistent chest pain with associated shob, headaches.  Has been trying to get in with her PCP and other clinics but no one is accepting new patients and her previous office is not seeing any patients until October.  She has been tested for COVID and was negative.  Denies smoking cigarettes.  No current facility-administered medications for this encounter.   Current Outpatient Medications:  .  Acetaminophen (TYLENOL PO), Take by mouth as needed., Disp: , Rfl:  .  AMBULATORY NON FORMULARY MEDICATION, 3 in 1 commode, Disp: 1 Device, Rfl: 0 .  amLODipine (NORVASC) 10 MG tablet, Take 10 mg by mouth daily., Disp: , Rfl:  .  [employer] F[employer] GUMMIES PO, Take 2 each by mouth daily., Disp: , Rfl:  .  IBUPROFEN PO, Take by mouth as needed., Disp: , Rfl:  .  vitamin B-12 (CYANOCOBALAMIN) 1000 MCG tablet, Take 1,000 mcg by mouth daily., Disp: , Rfl:     Allergies  Allergen Reactions  . Dilaudid [Hydromorphone Hcl] Itching  . Latex Rash and Other (See Comments)    Skin sensitivity     Past Medical History:  Diagnosis Date  . Anemia   . Anxiety   . Colon cancer ([place])    rectal  . Complication of anesthesia    during colonoscopy and wisdom teeth extraction  . Family history of breast cancer   . HTN (hypertension)   . Migraine   . Stroke ([place])    sickle cell trait     Past Surgical History:  Procedure Laterality Date  . ABDOMINAL HYSTERECTOMY     due to uterine fibroids  . AUGMENTATION MAMMAPLASTY Bilateral   . FLEXIBLE SIGMOIDOSCOPY N/A 04/12/2017   Procedure: FLEXIBLE SIGMOIDOSCOPY;  Surgeon: Milus Banister, MD;  [facility]: [facility];  Service: Endoscopy;  Laterality: N/A;  . FLEXIBLE  SIGMOIDOSCOPY N/A 07/18/2017   Procedure: FLEXIBLE SIGMOIDOSCOPY EXAM UNDER ANESTHESIA;  Surgeon: Ileana Roup, MD;  Location: [facility];  Service: General;  Laterality: N/A;  . FLEXIBLE SIGMOIDOSCOPY  07/26/2017   Procedure: FLEXIBLE SIGMOIDOSCOPY;  Surgeon: Ileana Roup, MD;  Location: [facility];  Service: General;;  . FLEXIBLE SIGMOIDOSCOPY N/A 10/19/2017   Procedure: FLEXIBLE SIGMOIDOSCOPY;  Surgeon: Ileana Roup, MD;  Location: [facility];  Service: General;  Laterality: N/A;  . ILEOSTOMY N/A 11/28/2017   Procedure: CLOSURE OF LOOP ILEOSTOMY ERAS PATHWAY;  Surgeon: Ileana Roup, MD;  [facility]: [facility];  Service: General;  Laterality: N/A;  . ILEOSTOMY REVISION     11-28-17  . LAPAROSCOPIC LOW ANTERIOR RESECTION N/A 07/26/2017   Procedure: LAPAROSCOPIC VS OPEN  LOW ANTERIOR RESECTION WITH [place] LOOP ILEOSTOMY;  Surgeon: Ileana Roup, MD;  [facility]: [facility];  Service: General;  Laterality: N/A;  . PROCTOSCOPY  07/26/2017   Procedure: PROCTOSCOPY;  Surgeon: Ileana Roup, MD;  [facility]: [facility];  Service: General;;  . TONSILLECTOMY    . TUBAL LIGATION    . WISDOM TOOTH EXTRACTION      ROS  Objective:   Vitals: BP (!) 159/109   Pulse 73   Temp 98.4 F (36.9 C)   Resp 14   SpO2 100%   Physical Exam Constitutional:  General: She is not in acute distress.    Appearance: Normal appearance. She is well-developed. She is not ill-appearing, toxic-appearing or diaphoretic.  HENT:     Head: Normocephalic and atraumatic.     Nose: Nose normal.     Mouth/Throat:     Mouth: Mucous membranes are moist.  Eyes:     Extraocular Movements: Extraocular movements intact.     Pupils: Pupils are equal, round, and reactive to light.  Cardiovascular:     Rate and Rhythm: Normal rate and regular rhythm.     Pulses: Normal pulses.     Heart sounds: Normal heart sounds. No murmur. No friction rub. No gallop.   Pulmonary:     Effort: Pulmonary  effort is normal. No respiratory distress.     Breath sounds: Normal breath sounds. No stridor. No wheezing, rhonchi or rales.  Chest:     Chest wall: No tenderness.  Skin:    General: Skin is warm and dry.     Findings: No rash.  Neurological:     Mental Status: She is alert and oriented to person, place, and time.  Psychiatric:        Mood and Affect: Mood normal.        Behavior: Behavior normal.        Thought Content: Thought content normal.     ED ECG REPORT   Date: 02/12/2019  Rate: 69bpm  Rhythm: normal sinus rhythm  QRS Axis: normal  Intervals: normal  ST/T Wave abnormalities: T wave flattening in leads II, III, aVF with possible depression in III, also has T wave flattening in V5, V6 which is different from her previous EKG in 2018  Conduction Disutrbances:none  Narrative Interpretation: T wave changes in inferior leads, lateral leads, sinus rhythm at 69 bpm.  Old EKG Reviewed: changes noted  I have personally reviewed the EKG tracing and agree with the computerized printout as noted.  Assessment and Plan :   1. Atypical chest pain   2. Rectal cancer ([place])   3. Acute electrocardiogram changes   4. Other fatigue   5. Essential hypertension    Case discussed with Dr. Valere Dross including EKG review.  Were both in agreement the patient needs to have troponin levels to rule out ACS.  Patient was redirected to the emergency room for immediate work-up.  I did refill her amlodipine for 90-day supply so that she has time to get in with a PCP and establish care. Counseled patient on potential for adverse effects with medications prescribed/recommended today, patient verbalized understanding.    Jaynee Eagles, PA-C 02/12/19 1234    Jaynee Eagles, PA-C 02/12/19 1234

## 2019-02-12 NOTE — Discharge Instructions (Signed)
Please report to the emergency room as she have significant EKG changes and need for work-up for ACS including repeat EKGs, troponin levels.

## 2019-02-12 NOTE — ED Triage Notes (Signed)
Pt reports being out of BP meds for over 3 weeks. 2 weeks ago she began to have chest pain and a headache. Pt states the chest pain has been constant and nagging. Pt also arrives with EKG from [facility] which read as T wave abnormality and was sent here for ACS rule out.

## 2019-02-12 NOTE — ED Triage Notes (Signed)
Pt states she was recently tested negative for covid and when she was at testing, they checked her blood pressure and it was high. Pt used to take amlodipine but does not have PCP to get it refilled. States last time she took the medicine was 3 weeks ago. C/o headaches.

## 2019-02-12 NOTE — ED Provider Notes (Signed)
[facility] EMERGENCY DEPARTMENT Provider Note   CSN: [id number] Arrival date & time: 02/12/19  1242    History   Chief Complaint Chief Complaint  Patient presents with   Hypertension   Chest Pain    HPI Donna Brandt is a 43 y.o. female.     43 y.o female with a PMH of Anxiety, Colon CA, HTN, Stroke presents to the ED with a chief complaint of chest pain, headache x 1 week. Patient reports she ran out of her blood pressure medication about three weeks ago and has been unable to obtain a prescription as we obtains her care in [place]. Patient has been endorsing an intermittent sharp pain to the center of her chest. She reports this pain is worse when lying flat but alleviate it with sitting up. Patient an episode of feeling this chest pain along with shortness of breath while at night, reports sitting up, states his episodes usually last 10 seconds.  She has been noticing these episodes are now worsening and happen to be radiating down her left arm from her chest.  Patient also endorses a headache, reports that bitemporal in nature and described as dull.  He has not tried any medical therapy for relieving symptoms.  Patient was originally seen at urgent care prior to arrival in the ED, was sent out to the ED for ACS work-up. Of note patient was a rectal cancer patient from 2018 to 2019, she denies any previous history of blood clots, previous history of CAD.  The history is provided by the patient.    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    Past Surgical History:  Procedure Laterality Date   ABDOMINAL HYSTERECTOMY     due to uterine fibroids   AUGMENTATION MAMMAPLASTY Bilateral    FLEXIBLE SIGMOIDOSCOPY N/A 04/12/2017   Procedure: FLEXIBLE SIGMOIDOSCOPY;  Surgeon: Milus Banister, MD;  Location: [facility];  Service: Endoscopy;  Laterality: N/A;   FLEXIBLE SIGMOIDOSCOPY N/A 07/18/2017   Procedure: FLEXIBLE SIGMOIDOSCOPY EXAM UNDER ANESTHESIA;  Surgeon: Ileana Roup, MD;  Location: [facility];  Service: General;  Laterality: N/A;   FLEXIBLE SIGMOIDOSCOPY  07/26/2017   Procedure: FLEXIBLE SIGMOIDOSCOPY;  Surgeon: Ileana Roup, MD;  Location: [facility];  Service: General;;   FLEXIBLE SIGMOIDOSCOPY N/A 10/19/2017   Procedure: FLEXIBLE SIGMOIDOSCOPY;  Surgeon: Ileana Roup, MD;  Location: [facility];  Service: General;  Laterality: N/A;   ILEOSTOMY N/A 11/28/2017   Procedure: CLOSURE OF LOOP ILEOSTOMY ERAS PATHWAY;  Surgeon: Ileana Roup, MD;  Location: [facility];  Service: General;  Laterality: N/A;   ILEOSTOMY REVISION     11-28-17   LAPAROSCOPIC LOW ANTERIOR RESECTION N/A 07/26/2017   Procedure: LAPAROSCOPIC VS OPEN  LOW ANTERIOR RESECTION WITH [place];  Surgeon: Ileana Roup, MD;  Location: [facility];  Service: General;  Laterality: N/A;   PROCTOSCOPY  07/26/2017   Procedure: PROCTOSCOPY;  Surgeon: Dema Severin,  Sharon Mt, MD;  Location: [facility];  Service: General;;   TONSILLECTOMY     TUBAL LIGATION     WISDOM TOOTH EXTRACTION       OB History   No obstetric history on file.      Home Medications    Prior to Admission medications   Medication Sig Start Date End Date Taking? Authorizing Provider  Acetaminophen (TYLENOL PO) Take by mouth as needed.    [provider]  AMBULATORY NON FORMULARY MEDICATION 3 in 1 commode 05/03/18   Cameron Sprang, MD  amLODipine (NORVASC) 10 MG tablet Take 1 tablet (10 mg total) by mouth  daily. 02/12/19   Jaynee Eagles, PA-C  [facility] F[facility] GUMMIES PO Take 2 each by mouth daily.    [provider]  IBUPROFEN PO Take by mouth as needed.    [provider]  vitamin B-12 (CYANOCOBALAMIN) 1000 MCG tablet Take 1,000 mcg by mouth daily.    [provider]  pregabalin (LYRICA) 75 MG capsule Take 1 cap in Am, 2 caps in PM 06/25/18 02/12/19  Cameron Sprang, MD    Family History Family History  Problem Relation Age of Onset   Breast cancer Other 10   Colon cancer Neg Hx    Esophageal cancer Neg Hx    Rectal cancer Neg Hx    Stomach cancer Neg Hx     Social History Social History   Tobacco Use   Smoking status: Never Smoker   Smokeless tobacco: Never Used  Substance Use Topics   Alcohol use: Yes    Alcohol/week: 1.0 standard drinks    Types: 1 Glasses of wine per week    Comment: in social settings   Drug use: No     Allergies   Dilaudid [hydromorphone hcl] and Latex   Review of Systems Review of Systems  Constitutional: Negative for chills and fever.  HENT: Negative for ear pain and sore throat.   Eyes: Negative for pain and visual disturbance.  Respiratory: Negative for cough and shortness of breath.   Cardiovascular: Positive for chest pain. Negative for palpitations.  Gastrointestinal: Negative for abdominal pain and vomiting.  Genitourinary: Negative for dysuria and hematuria.  Musculoskeletal: Negative for arthralgias and back pain.  Skin: Negative for color change and rash.  Neurological: Positive for headaches. Negative for seizures and syncope.  All other systems reviewed and are negative.    Physical Exam Updated Vital Signs BP (!) 159/127    Pulse 68    Temp 98.3 F (36.8 C) (Oral)    Resp 16    Ht 5\' 6"  (1.676 m)    Wt 86.2 kg    SpO2 100%    BMI 30.67 kg/m   Physical Exam Vitals signs and nursing note reviewed.  Constitutional:      General: She is not in acute distress.    Appearance: She is well-developed.      Comments: Non-ill-appearing.  HENT:     Head: Normocephalic and atraumatic.     Mouth/Throat:     Pharynx: No oropharyngeal exudate.  Eyes:     Pupils: Pupils are equal, round, and reactive to light.  Neck:     Musculoskeletal: Normal range of motion.  Cardiovascular:     Rate and Rhythm: Normal rate and regular rhythm.     Heart sounds: Normal heart sounds. No murmur.     Comments: No extra heart sounds auscultated.  Pulmonary:     Effort: Pulmonary effort is normal.  No respiratory distress.     Breath sounds: Normal breath sounds. No decreased breath sounds or wheezing.     Comments: Lungs are clear to auscultation. Abdominal:     General: Bowel sounds are normal. There is no distension.     Palpations: Abdomen is soft.     Tenderness: There is no abdominal tenderness.  Musculoskeletal:        General: No tenderness or deformity.     Right lower leg: No edema.     Left lower leg: No edema.     Comments: No BL leg edema.   Skin:    General: Skin is warm and dry.  Neurological:     Mental Status: She is alert and oriented to person, place, and time.      ED Treatments / Results  Labs (all labs ordered are listed, but only abnormal results are displayed) Labs Reviewed  BASIC METABOLIC PANEL - Abnormal; Notable for the following components:      Result Value   Calcium 10.6 (*)    All other components within normal limits  CBC - Abnormal; Notable for the following components:   RBC 5.47 (*)    MCV 77.3 (*)    MCH 25.8 (*)    All other components within normal limits  D-DIMER, QUANTITATIVE (NOT AT [facility]) - Abnormal; Notable for the following components:   D-Dimer, Quant 0.69 (*)    All other components within normal limits  TROPONIN I (HIGH SENSITIVITY)  TROPONIN I (HIGH SENSITIVITY)  BRAIN NATRIURETIC PEPTIDE    EKG EKG Interpretation  Date/Time:  Wednesday February 12 2019 12:50:17 EDT Ventricular Rate:  78 PR Interval:  156 QRS Duration: 82 QT  Interval:  384 QTC Calculation: 437 R Axis:   62 Text Interpretation:    Radiology Dg Chest 2 View  Result Date: 02/12/2019 CLINICAL DATA:  43 year old female recently tested negative for COVID-19. Hypertensive chest pain. Headaches. EXAM: CHEST - 2 VIEW COMPARISON:  None. FINDINGS: Mild cardiomegaly. Other mediastinal contours are within normal limits. Visualized tracheal air column is within normal limits. Lung volumes are within normal limits. Both lungs appear clear. No pneumothorax or pleural effusion. No acute osseous abnormality identified. Negative visible bowel gas pattern. IMPRESSION: Mild cardiomegaly.  No acute cardiopulmonary abnormality. Electronically Signed   By: Genevie Ann M.D.   On: 02/12/2019 13:25   Ct Angio Chest Pe W And/or Wo Contrast  Result Date: 02/12/2019 CLINICAL DATA:  Shortness of breath and chest pain. Positive D-dimer study EXAM: CT ANGIOGRAPHY CHEST WITH CONTRAST TECHNIQUE: Multidetector CT imaging of the chest was performed using the standard protocol during bolus administration of intravenous contrast. Multiplanar CT image reconstructions and MIPs were obtained to evaluate the vascular anatomy. CONTRAST:  57mL OMNIPAQUE IOHEXOL 350 MG/ML SOLN COMPARISON:  Chest radiograph February 12, 2019 FINDINGS: Cardiovascular: There is no demonstrable pulmonary embolus. There is no thoracic aortic aneurysm or dissection. The visualized great vessels appear unremarkable. There is no pericardial effusion or pericardial thickening evident. Mediastinum/Nodes: Thyroid appears unremarkable. There is no demonstrable thoracic adenopathy. No esophageal lesions are evident. Lungs/Pleura: There is no edema or consolidation. No pleural effusion evident. Upper Abdomen: Visualized upper abdominal structures appear unremarkable. Musculoskeletal: There are breast implants bilaterally. There are no blastic or lytic bone lesions. Review of the MIP images confirms the above findings. IMPRESSION: 1. No  demonstrable pulmonary embolus. No thoracic aortic aneurysm or dissection. 2.  Lungs clear. 3.  No adenopathy evident. 4.  Breast implants bilaterally. Electronically Signed  By: Lowella Grip III M.D.   On: 02/12/2019 17:55    Procedures Procedures (including critical care time)  Medications Ordered in ED Medications  sodium chloride flush (NS) 0.9 % injection 3 mL (has no administration in time range)  metoCLOPramide (REGLAN) tablet 10 mg (10 mg Oral Given 02/12/19 1644)  diphenhydrAMINE (BENADRYL) capsule 25 mg (25 mg Oral Given 02/12/19 1644)  amLODipine (NORVASC) tablet 10 mg (10 mg Oral Given 02/12/19 1836)  iohexol (OMNIPAQUE) 350 MG/ML injection 75 mL (75 mLs Intravenous Contrast Given 02/12/19 1740)     Initial Impression / Assessment and Plan / ED Course  I have reviewed the triage vital signs and the nursing notes.  Pertinent labs & imaging results that were available during my care of the patient were reviewed by me and considered in my medical decision making (see chart for details).    Patient with a prior history of rectal carcinoma from years 2018-2019 presents to the ED with complaints of chest pain, hypertension, headache.  Patient has been out of her blood pressure medication for the next 3 weeks, she was currently on Norvasc along with amlodipine.  She does not have a PCP who she follows up with.  Also endorses headache, neuro exam is unremarkable. Does complain of sharp central chest pressure worse with lying flat, better with sitting up.  Will obtain laboratory screening along with chest x-ray to further evaluate. Due to previous history of carcinoma will also obtain d-dimer.  Although no tachycardia, hypoxia on today's visit.  BMP showed no electrolyte abnormality.  Creatinine level is within normal limits.  CBC showed no leukocytosis, hemoglobin is unremarkable.  First troponin obtained was less than 2.   Chest x-ray showed: Mild cardiomegaly. No acute  cardiopulmonary abnormality.  We will add on a BNP to rule out any heart failure.  Patient also provided with Reglan, Benadryl for her headache. BNP within normal limits.  A d-dimer was obtained which was positive.  CT angios PE ordered:  1. No demonstrable pulmonary embolus. No thoracic aortic aneurysm or  dissection.    2. Lungs clear.    3. No adenopathy evident.    4. Breast implants bilaterally.      Results were discussed with patient.  Patient was given her home meds for blood pressure amlodipine 10 mg.  She reports the pain is mostly positional.  Due to changes in her EKG, shortness of breath along with prior history of carcinoma suspect patient would benefit from admission to further evaluate her symptoms.  Will place call for hospitalist admission.   6:43 PM Spoke to hospitalist who will admit patient for chest pain r/o.     Portions of this note were generated with [employer]. Dictation errors may occur despite best attempts at proofreading.   Final Clinical Impressions(s) / ED Diagnoses   Final diagnoses:  Atypical chest pain    ED Discharge Orders    None       Janeece Fitting, PA-C 02/12/19 [place], DO 02/12/19 [phone]

## 2019-02-13 ENCOUNTER — Encounter (HOSPITAL_COMMUNITY): Payer: Self-pay | Admitting: *Deleted

## 2019-02-13 DIAGNOSIS — I160 Hypertensive urgency: Secondary | ICD-10-CM

## 2019-02-13 DIAGNOSIS — Z85048 Personal history of other malignant neoplasm of rectum, rectosigmoid junction, and anus: Secondary | ICD-10-CM | POA: Diagnosis not present

## 2019-02-13 DIAGNOSIS — R0602 Shortness of breath: Secondary | ICD-10-CM | POA: Diagnosis not present

## 2019-02-13 DIAGNOSIS — I10 Essential (primary) hypertension: Secondary | ICD-10-CM | POA: Diagnosis not present

## 2019-02-13 DIAGNOSIS — Z91040 Latex allergy status: Secondary | ICD-10-CM | POA: Diagnosis not present

## 2019-02-13 DIAGNOSIS — E669 Obesity, unspecified: Secondary | ICD-10-CM

## 2019-02-13 DIAGNOSIS — Z20828 Contact with and (suspected) exposure to other viral communicable diseases: Secondary | ICD-10-CM | POA: Diagnosis not present

## 2019-02-13 DIAGNOSIS — R0789 Other chest pain: Secondary | ICD-10-CM | POA: Diagnosis not present

## 2019-02-13 DIAGNOSIS — Z79899 Other long term (current) drug therapy: Secondary | ICD-10-CM | POA: Diagnosis not present

## 2019-02-13 DIAGNOSIS — R079 Chest pain, unspecified: Secondary | ICD-10-CM | POA: Diagnosis not present

## 2019-02-13 LAB — HIV ANTIBODY (ROUTINE TESTING W REFLEX): HIV Screen 4th Generation wRfx: NONREACTIVE

## 2019-02-13 MED ORDER — OXYCODONE-ACETAMINOPHEN 5-325 MG PO TABS
1.0000 | ORAL_TABLET | ORAL | Status: DC | PRN
  Administered 2019-02-13 – 2019-02-14 (×2): 1 via ORAL
  Filled 2019-02-13 (×2): qty 1

## 2019-02-13 MED ORDER — HYDROCHLOROTHIAZIDE 25 MG PO TABS
25.0000 mg | ORAL_TABLET | Freq: Every day | ORAL | Status: DC
  Administered 2019-02-13 – 2019-02-14 (×2): 25 mg via ORAL
  Filled 2019-02-13 (×2): qty 1

## 2019-02-13 MED ORDER — HYDRALAZINE HCL 20 MG/ML IJ SOLN: 10.0000 mg | Freq: Four times a day (QID) | INTRAMUSCULAR | Status: DC | PRN

## 2019-02-13 NOTE — Progress Notes (Signed)
PROGRESS NOTE    Donna Brandt  FUX:[id number] DOB: May 31, 1976 DOA: 02/12/2019 PCP: Patient, No Pcp Per    Brief Narrative:  43 year old female who presented with chest pain, she does have significant past medical history for hypertension and rectal cancer.  Apparently patient has been out of her antihypertensive medications for about 3 weeks.  She developed central chest pain, and headaches.  On her initial physical examination blood pressure 156/109, pulse rate 64, respiratory 16, oxygen saturation 100%, moist mucous membranes, lungs clear to auscultation bilaterally, heart S1-S2 present and rhythmic, abdomen was soft nontender, no lower extremity edema.  Sodium 139, potassium 4.0, chloride 103, bicarb 24, glucose 93, BUN 8, creatinine 0.56, troponin less than 2, white count 4.4, hemoglobin 14.1, hematocrit 30.3, platelets 215, d-dimer 0.69, SARS COVID-19 was negative.  Chest radiograph with mild cardiomegaly, no infiltrates, CT chest negative for pulmonary embolus.  EKG, 69 bpm, normal axis, normal intervals, sinus rhythm, no ST segment or T wave changes.  Patient was admitted to the hospital working diagnosis of typical chest pain, associated with hypertensive emergency   Assessment & Plan:   Principal Problem:   Chest pain Active Problems:   HTN (hypertension)   1. Atypical chest pain. Patient continue to have chest pain, cardiac enzymes high sensitivity troponin have been negative and ekg with no ischemic changes. Chest pain likely related to uncontrolled HTN.   2. Uncontrolled HTN/ hypertensive urgency. This am continue to be uncontrolled, 177/110 mmHg and patient having symptomatic chest pain and headache. Will continue with 10 mg of amlodipine and will add diuretic therapy with HCTZ 25 mg daily. Continue blood pressure monitoring. As needed IV hydralazine.   3. Rectal cancer. Stable, will need follow up as outpatient   4. Obesity. Calculated BMI is 31,0, will need outpatient  follow up.   DVT prophylaxis: enoxaparin   Code Status: full Family Communication: no family at the bedside Disposition Plan/ discharge barriers: pending clinical improvement  Body mass index is 31.09 kg/m. Malnutrition Type:      Malnutrition Characteristics:      Nutrition Interventions:     RN Pressure Injury Documentation:     Consultants:     Procedures:     Antimicrobials:       Subjective: Patient had chest pain and headache this am, while her blood pressure was elevated, no nausea or vomiting, no dyspnea.   Objective: Vitals:   02/13/19 0555 02/13/19 0747 02/13/19 1011 02/13/19 1332  BP:  (!) 140/105 (!) 177/110 (!) 133/95  Pulse: 72     Resp: 18     Temp: 97.7 F (36.5 C)     TempSrc: Oral     SpO2: 100%     Weight: 87.4 kg     Height:        Intake/Output Summary (Last 24 hours) at 02/13/2019 1358 Last data filed at 02/13/2019 0700 Gross per 24 hour  Intake 360 ml  Output -  Net 360 ml   Filed Weights   02/12/19 1251 02/12/19 2158 02/13/19 0555  Weight: 86.2 kg 87.8 kg 87.4 kg    Examination:   General: deconditioned  Neurology: Awake and alert, non focal  E ENT: mild pallor, no icterus, oral mucosa moist Cardiovascular: No JVD. S1-S2 present, rhythmic, no gallops, rubs, or murmurs. No lower extremity edema. Pulmonary: positive breath sounds bilaterally, adequate air movement, no wheezing, rhonchi or rales. Gastrointestinal. Abdomen with no organomegaly, non tender, no rebound or guarding Skin. No rashes Musculoskeletal: no joint deformities  Data Reviewed: I have personally reviewed following labs and imaging studies  CBC: Recent Labs  Lab 02/12/19 1255  WBC 4.4  HGB 14.1  HCT 42.3  MCV 77.3*  PLT 751   Basic Metabolic Panel: Recent Labs  Lab 02/12/19 1255  NA 139  K 4.0  CL 103  CO2 24  GLUCOSE 93  BUN 8  CREATININE 0.56  CALCIUM 10.6*   GFR: Estimated Creatinine Clearance: 100.9 mL/min (by C-G  formula based on SCr of 0.56 mg/dL). Liver Function Tests: No results for input(s): AST, ALT, ALKPHOS, BILITOT, PROT, ALBUMIN in the last 168 hours. No results for input(s): LIPASE, AMYLASE in the last 168 hours. No results for input(s): AMMONIA in the last 168 hours. Coagulation Profile: No results for input(s): INR, PROTIME in the last 168 hours. Cardiac Enzymes: No results for input(s): CKTOTAL, CKMB, CKMBINDEX, TROPONINI in the last 168 hours. BNP (last 3 results) No results for input(s): PROBNP in the last 8760 hours. HbA1C: No results for input(s): HGBA1C in the last 72 hours. CBG: No results for input(s): GLUCAP in the last 168 hours. Lipid Profile: No results for input(s): CHOL, HDL, LDLCALC, TRIG, CHOLHDL, LDLDIRECT in the last 72 hours. Thyroid Function Tests: No results for input(s): TSH, T4TOTAL, FREET4, T3FREE, THYROIDAB in the last 72 hours. Anemia Panel: No results for input(s): VITAMINB12, FOLATE, FERRITIN, TIBC, IRON, RETICCTPCT in the last 72 hours.    Radiology Studies: I have reviewed all of the imaging during this hospital visit personally     Scheduled Meds: . amLODipine  10 mg Oral Daily  . enoxaparin (LOVENOX) injection  40 mg Subcutaneous Q24H  . hydrochlorothiazide  25 mg Oral Daily  . sodium chloride flush  3 mL Intravenous Once   Continuous Infusions:   LOS: 0 days        Saisha Hogue Gerome Apley, MD

## 2019-02-14 ENCOUNTER — Inpatient Hospital Stay (HOSPITAL_BASED_OUTPATIENT_CLINIC_OR_DEPARTMENT_OTHER): Payer: BC Managed Care – PPO | Admitting: Nurse Practitioner

## 2019-02-14 ENCOUNTER — Inpatient Hospital Stay: Payer: BC Managed Care – PPO | Attending: Nurse Practitioner

## 2019-02-14 ENCOUNTER — Other Ambulatory Visit: Payer: Self-pay

## 2019-02-14 ENCOUNTER — Encounter: Payer: Self-pay | Admitting: Nurse Practitioner

## 2019-02-14 VITALS — BP 117/92 | HR 81 | Temp 98.9°F | Resp 18 | Ht 66.0 in | Wt 193.1 lb

## 2019-02-14 DIAGNOSIS — I10 Essential (primary) hypertension: Secondary | ICD-10-CM | POA: Diagnosis not present

## 2019-02-14 DIAGNOSIS — G629 Polyneuropathy, unspecified: Secondary | ICD-10-CM

## 2019-02-14 DIAGNOSIS — C20 Malignant neoplasm of rectum: Secondary | ICD-10-CM

## 2019-02-14 DIAGNOSIS — Z923 Personal history of irradiation: Secondary | ICD-10-CM

## 2019-02-14 DIAGNOSIS — F329 Major depressive disorder, single episode, unspecified: Secondary | ICD-10-CM | POA: Insufficient documentation

## 2019-02-14 DIAGNOSIS — E669 Obesity, unspecified: Secondary | ICD-10-CM | POA: Diagnosis not present

## 2019-02-14 DIAGNOSIS — R079 Chest pain, unspecified: Secondary | ICD-10-CM | POA: Diagnosis not present

## 2019-02-14 DIAGNOSIS — Z20828 Contact with and (suspected) exposure to other viral communicable diseases: Secondary | ICD-10-CM | POA: Diagnosis not present

## 2019-02-14 DIAGNOSIS — I160 Hypertensive urgency: Secondary | ICD-10-CM | POA: Diagnosis not present

## 2019-02-14 MED ORDER — AMLODIPINE BESYLATE 10 MG PO TABS: 10.0000 mg | ORAL_TABLET | Freq: Every day | ORAL | 0 refills | Status: DC

## 2019-02-14 MED ORDER — HYDROCHLOROTHIAZIDE 25 MG PO TABS: 25.0000 mg | ORAL_TABLET | Freq: Every day | ORAL | 0 refills | Status: DC

## 2019-02-14 NOTE — Discharge Summary (Signed)
Physician Discharge Summary  Donna Brandt YHC:[id number] DOB: 1976/06/03 DOA: 02/12/2019  PCP: Patient, No Pcp Per  Admit date: 02/12/2019 Discharge date: 02/14/2019  Admitted From: Home  Disposition:  Home   Recommendations for Outpatient Follow-up and new medication changes:  1. Follow up with Primary Care in 7 days.  2. Continue amlodipine 10 mg daily and added hydrochlorothiazide 25 mg daily.   Home Health: no   Equipment/Devices: no    Discharge Condition: stable  CODE STATUS: full  Diet recommendation: heart healthy   Brief/Interim Summary: 43 year old female who presented with chest pain. She does have significant past medical history for hypertension and rectal cancer.  Apparently patient has been out of her antihypertensive medications for about 3 weeks.  She developed central chest pain, and headaches.  On her initial physical examination blood pressure 156/109, pulse rate 64, respiratory rate 16, oxygen saturation 100%, moist mucous membranes, lungs clear to auscultation bilaterally, heart S1-S2 present and rhythmic, abdomen was soft nontender, no lower extremity edema.  Sodium 139, potassium 4.0, chloride 103, bicarb 24, glucose 93, BUN 8, creatinine 0.56, troponin less than 2, white count 4.4, hemoglobin 14.1, hematocrit 30.3, platelets 215, d-dimer 0.69, SARS COVID-19 was negative.  Chest radiograph with mild cardiomegaly, no infiltrates, CT chest negative for pulmonary embolus.  EKG, 69 bpm, normal axis, normal intervals, sinus rhythm, no ST segment or T wave changes.  Patient was admitted to the hospital working diagnosis of atypical chest pain, associated with hypertensive emergency.  1.  Atypical chest pain, rule out for acute coronary syndrome.  Patient was admitted to the medical ward, she had serial troponins and electrocardiograms, negative for ischemia.  Likely her chest pain is related to uncontrolled hypertension.  2.  Uncontrolled hypertension, hypertensive urgency.   Patient was continued on amlodipine 10 mg daily and hydrochlorothiazide 25 mg daily was added to her regiment with good clinical response.  Her chest pain resolved, her discharge blood pressure is 108/83 and 133/89.  Patient will need follow-up as an outpatient.  She was advised about compliance with heart healthy diet and antihypertensive medications.  3.  Rectal cancer.  Continue follow-up as an outpatient.  4.  Obesity.  Calculated BMI 31.   Discharge Diagnoses:  Principal Problem:   Chest pain Active Problems:   HTN (hypertension)    Discharge Instructions   Allergies as of 02/14/2019      Reactions   Dilaudid [hydromorphone Hcl] Itching   Latex Rash, Other (See Comments)   Skin sensitivity       Medication List    TAKE these medications   acetaminophen 500 MG tablet Commonly known as: TYLENOL Take 500-1,000 mg by mouth every 6 (six) hours as needed (for headaches).   AMBULATORY NON FORMULARY MEDICATION 3 in 1 commode   amLODipine 10 MG tablet Commonly known as: NORVASC Take 1 tablet (10 mg total) by mouth daily.   [facility] FIBER GUMMIES PO Take 2 each by mouth daily.   hydrochlorothiazide 25 MG tablet Commonly known as: HYDRODIURIL Take 1 tablet (25 mg total) by mouth daily.       Allergies  Allergen Reactions  . Dilaudid [Hydromorphone Hcl] Itching  . Latex Rash and Other (See Comments)    Skin sensitivity     Consultations:     Procedures/Studies: Dg Chest 2 View  Result Date: 02/12/2019 CLINICAL DATA:  43 year old female recently tested negative for COVID-19. Hypertensive chest pain. Headaches. EXAM: CHEST - 2 VIEW COMPARISON:  None. FINDINGS: Mild cardiomegaly. Other mediastinal  contours are within normal limits. Visualized tracheal air column is within normal limits. Lung volumes are within normal limits. Both lungs appear clear. No pneumothorax or pleural effusion. No acute osseous abnormality identified. Negative visible bowel gas pattern.  IMPRESSION: Mild cardiomegaly.  No acute cardiopulmonary abnormality. Electronically Signed   By: Genevie Ann M.D.   On: 02/12/2019 13:25   Ct Angio Chest Pe W And/or Wo Contrast  Result Date: 02/12/2019 CLINICAL DATA:  Shortness of breath and chest pain. Positive D-dimer study EXAM: CT ANGIOGRAPHY CHEST WITH CONTRAST TECHNIQUE: Multidetector CT imaging of the chest was performed using the standard protocol during bolus administration of intravenous contrast. Multiplanar CT image reconstructions and MIPs were obtained to evaluate the vascular anatomy. CONTRAST:  46mL OMNIPAQUE IOHEXOL 350 MG/ML SOLN COMPARISON:  Chest radiograph February 12, 2019 FINDINGS: Cardiovascular: There is no demonstrable pulmonary embolus. There is no thoracic aortic aneurysm or dissection. The visualized great vessels appear unremarkable. There is no pericardial effusion or pericardial thickening evident. Mediastinum/Nodes: Thyroid appears unremarkable. There is no demonstrable thoracic adenopathy. No esophageal lesions are evident. Lungs/Pleura: There is no edema or consolidation. No pleural effusion evident. Upper Abdomen: Visualized upper abdominal structures appear unremarkable. Musculoskeletal: There are breast implants bilaterally. There are no blastic or lytic bone lesions. Review of the MIP images confirms the above findings. IMPRESSION: 1. No demonstrable pulmonary embolus. No thoracic aortic aneurysm or dissection. 2.  Lungs clear. 3.  No adenopathy evident. 4.  Breast implants bilaterally. Electronically Signed   By: Lowella Grip III M.D.   On: 02/12/2019 17:55      Procedures:   Subjective: Patient is feeling better, improved chest pain and headache, no nausea or vomiting.   Discharge Exam: Vitals:   02/13/19 1956 02/14/19 0421  BP: 133/89 108/83  Pulse: 70 73  Resp: 16 14  Temp: 98.3 F (36.8 C) 97.8 F (36.6 C)  SpO2: 100% 100%   Vitals:   02/13/19 1332 02/13/19 1737 02/13/19 1956 02/14/19 0421  BP: (!)  133/95 (!) 134/96 133/89 108/83  Pulse:   70 73  Resp:   16 14  Temp:   98.3 F (36.8 C) 97.8 F (36.6 C)  TempSrc:   Oral Oral  SpO2:   100% 100%  Weight:    87 kg  Height:        General: Not in pain or dyspnea  Neurology: Awake and alert, non focal  E ENT: no pallor, no icterus, oral mucosa moist Cardiovascular: No JVD. S1-S2 present, rhythmic, no gallops, rubs, or murmurs. No lower extremity edema. Pulmonary: vesicular breath sounds bilaterally, adequate air movement, no wheezing, rhonchi or rales. Gastrointestinal. Abdomen with, no organomegaly, non tender, no rebound or guarding Skin. No rashes Musculoskeletal: no joint deformities   The results of significant diagnostics from this hospitalization (including imaging, microbiology, ancillary and laboratory) are listed below for reference.     Microbiology: Recent Results (from the past 240 hour(s))  SARS Coronavirus 2 (CEPHEID - Performed in [facility] hospital lab), Hosp Order     Status: None   Collection Time: 02/12/19  9:34 PM   Specimen: Nasopharyngeal Swab  Result Value Ref Range Status   SARS Coronavirus 2 NEGATIVE NEGATIVE Final    Comment: (NOTE) If result is NEGATIVE SARS-CoV-2 target nucleic acids are NOT DETECTED. The SARS-CoV-2 RNA is generally detectable in upper and lower  respiratory specimens during the acute phase of infection. The lowest  concentration of SARS-CoV-2 viral copies this assay can detect is 250  copies / mL. A negative result does not preclude SARS-CoV-2 infection  and should not be used as the sole basis for treatment or other  patient management decisions.  A negative result may occur with  improper specimen collection / handling, submission of specimen other  than nasopharyngeal swab, presence of viral mutation(s) within the  areas targeted by this assay, and inadequate number of viral copies  (<250 copies / mL). A negative result must be combined with clinical  observations,  patient history, and epidemiological information. If result is POSITIVE SARS-CoV-2 target nucleic acids are DETECTED. The SARS-CoV-2 RNA is generally detectable in upper and lower  respiratory specimens dur ing the acute phase of infection.  Positive  results are indicative of active infection with SARS-CoV-2.  Clinical  correlation with patient history and other diagnostic information is  necessary to determine patient infection status.  Positive results do  not rule out bacterial infection or co-infection with other viruses. If result is PRESUMPTIVE POSTIVE SARS-CoV-2 nucleic acids MAY BE PRESENT.   A presumptive positive result was obtained on the submitted specimen  and confirmed on repeat testing.  While 2019 novel coronavirus  (SARS-CoV-2) nucleic acids may be present in the submitted sample  additional confirmatory testing may be necessary for epidemiological  and / or clinical management purposes  to differentiate between  SARS-CoV-2 and other Sarbecovirus currently known to infect humans.  If clinically indicated additional testing with an alternate test  methodology [phone]) is advised. The SARS-CoV-2 RNA is generally  detectable in upper and lower respiratory sp ecimens during the acute  phase of infection. The expected result is Negative. Fact Sheet for Patients:  StrictlyIdeas.no Fact Sheet for Healthcare Providers: BankingDealers.co.za This test is not yet approved or cleared by the [place] FDA and has been authorized for detection and/or diagnosis of SARS-CoV-2 by FDA under an Emergency Use Authorization (EUA).  This EUA will remain in effect (meaning this test can be used) for the duration of the COVID-19 declaration under Section 564(b)(1) of the Act, 21 U.S.C. section 360bbb-3(b)(1), unless the authorization is terminated or revoked sooner. Performed at [facility], [place] [address] [place],  [place] [address]      Labs: BNP (last 3 results) Recent Labs    02/12/19 1600  BNP 50.9   Basic Metabolic Panel: Recent Labs  Lab 02/12/19 1255  NA 139  K 4.0  CL 103  CO2 24  GLUCOSE 93  BUN 8  CREATININE 0.56  CALCIUM 10.6*   Liver Function Tests: No results for input(s): AST, ALT, ALKPHOS, BILITOT, PROT, ALBUMIN in the last 168 hours. No results for input(s): LIPASE, AMYLASE in the last 168 hours. No results for input(s): AMMONIA in the last 168 hours. CBC: Recent Labs  Lab 02/12/19 1255  WBC 4.4  HGB 14.1  HCT 42.3  MCV 77.3*  PLT 215   Cardiac Enzymes: No results for input(s): CKTOTAL, CKMB, CKMBINDEX, TROPONINI in the last 168 hours. BNP: Invalid input(s): POCBNP CBG: No results for input(s): GLUCAP in the last 168 hours. D-Dimer Recent Labs    02/12/19 1621  DDIMER 0.69*   Hgb A1c No results for input(s): HGBA1C in the last 72 hours. Lipid Profile No results for input(s): CHOL, HDL, LDLCALC, TRIG, CHOLHDL, LDLDIRECT in the last 72 hours. Thyroid function studies No results for input(s): TSH, T4TOTAL, T3FREE, THYROIDAB in the last 72 hours.  Invalid input(s): FREET3 Anemia work up No results for input(s): VITAMINB12, FOLATE, FERRITIN, TIBC, IRON, RETICCTPCT  in the last 72 hours. Urinalysis    Component Value Date/Time   LABSPEC 1.015 05/14/2017 0942   PHURINE 6.0 05/14/2017 0942   GLUCOSEU Negative 05/14/2017 0942   HGBUR Moderate 05/14/2017 0942   BILIRUBINUR Negative 05/14/2017 0942   KETONESUR Negative 05/14/2017 0942   PROTEINUR Negative 05/14/2017 0942   UROBILINOGEN 0.2 05/14/2017 0942   NITRITE Negative 05/14/2017 0942   LEUKOCYTESUR Negative 05/14/2017 0942   Sepsis Labs Invalid input(s): PROCALCITONIN,  WBC,  LACTICIDVEN Microbiology Recent Results (from the past 240 hour(s))  SARS Coronavirus 2 (CEPHEID - Performed in [place] hospital lab), Hosp Order     Status: None   Collection Time: 02/12/19  9:34 PM   Specimen:  Nasopharyngeal Swab  Result Value Ref Range Status   SARS Coronavirus 2 NEGATIVE NEGATIVE Final    Comment: (NOTE) If result is NEGATIVE SARS-CoV-2 target nucleic acids are NOT DETECTED. The SARS-CoV-2 RNA is generally detectable in upper and lower  respiratory specimens during the acute phase of infection. The lowest  concentration of SARS-CoV-2 viral copies this assay can detect is 250  copies / mL. A negative result does not preclude SARS-CoV-2 infection  and should not be used as the sole basis for treatment or other  patient management decisions.  A negative result may occur with  improper specimen collection / handling, submission of specimen other  than nasopharyngeal swab, presence of viral mutation(s) within the  areas targeted by this assay, and inadequate number of viral copies  (<250 copies / mL). A negative result must be combined with clinical  observations, patient history, and epidemiological information. If result is POSITIVE SARS-CoV-2 target nucleic acids are DETECTED. The SARS-CoV-2 RNA is generally detectable in upper and lower  respiratory specimens dur ing the acute phase of infection.  Positive  results are indicative of active infection with SARS-CoV-2.  Clinical  correlation with patient history and other diagnostic information is  necessary to determine patient infection status.  Positive results do  not rule out bacterial infection or co-infection with other viruses. If result is PRESUMPTIVE POSTIVE SARS-CoV-2 nucleic acids MAY BE PRESENT.   A presumptive positive result was obtained on the submitted specimen  and confirmed on repeat testing.  While 2019 novel coronavirus  (SARS-CoV-2) nucleic acids may be present in the submitted sample  additional confirmatory testing may be necessary for epidemiological  and / or clinical management purposes  to differentiate between  SARS-CoV-2 and other Sarbecovirus currently known to infect humans.  If clinically  indicated additional testing with an alternate test  methodology [phone]) is advised. The SARS-CoV-2 RNA is generally  detectable in upper and lower respiratory sp ecimens during the acute  phase of infection. The expected result is Negative. Fact Sheet for Patients:  StrictlyIdeas.no Fact Sheet for Healthcare Providers: BankingDealers.co.za This test is not yet approved or cleared by the [place] FDA and has been authorized for detection and/or diagnosis of SARS-CoV-2 by FDA under an Emergency Use Authorization (EUA).  This EUA will remain in effect (meaning this test can be used) for the duration of the COVID-19 declaration under Section 564(b)(1) of the Act, 21 U.S.C. section 360bbb-3(b)(1), unless the authorization is terminated or revoked sooner. Performed at [facility], [place] [address] [place], [place] [address]      Time coordinating discharge: 45 minutes  SIGNED:   Tawni Millers, MD  [facility] 02/14/2019, 8:42 AM

## 2019-02-14 NOTE — TOC Transition Note (Signed)
Transition of Care [facility]) - CM/SW Discharge Note   Patient Details  Name: Donna Brandt MRN: [id number] Date of Birth: 10-Nov-1975  [facility] CM/SW Contact:  Bethena Roys, RN Phone Number: 02/14/2019, 9:51 AM   Clinical Narrative:   Pt presented for Chest Pain- Recently just relocated to [facility] from [place]. Patient no longer has PCP- card connected to [facility], however not accepting new patients. CM did call [facility]- appointment scheduled and placed on the AVS. Patient has transportation to appointments and to [facility] [address]. Medications do not cost over $3.00. Pt will transition home today. No further needs from CM at this time.   Final next level of care: Home/Self Care Barriers to Discharge: No Barriers Identified   Patient Goals and CMS Choice     Choice offered to / list presented to : NA  Discharge Placement                       Discharge Plan and Services In-house Referral: NA Discharge Planning Services: Follow-up appt scheduled, [facility], Medication Assistance                                 Social Determinants of Health (SDOH) Interventions     Readmission Risk Interventions No flowsheet data found.

## 2019-02-14 NOTE — Plan of Care (Signed)
  Problem: Clinical Measurements: Goal: Cardiovascular complication will be avoided Outcome: Progressing   Problem: Clinical Measurements: Goal: Ability to maintain clinical measurements within normal limits will improve Outcome: Adequate for Discharge   Problem: Clinical Measurements: Goal: Diagnostic test results will improve Outcome: Adequate for Discharge   Problem: Pain Managment: Goal: General experience of comfort will improve Outcome: Adequate for Discharge

## 2019-02-14 NOTE — Progress Notes (Signed)
[facility] OFFICE PROGRESS NOTE   Diagnosis: Rectal cancer  INTERVAL HISTORY:   Donna Brandt returns as scheduled.  She was recently discharged from the hospital after presenting for evaluation of chest pain and headaches.  Symptoms felt to be secondary to uncontrolled hypertension.  She has had no change in bowel habits.  Since undergoing surgery she has noted abdominal cramping and "gas" after eating certain foods.  No rectal bleeding.  No nausea or vomiting.  She has a good appetite.  Neuropathy symptoms have significantly improved.  She is no longer using a cane.  Objective:  Vital signs in last 24 hours:  Blood pressure (!) 117/92, pulse 81, temperature 98.9 F (37.2 C), temperature source Oral, resp. rate 18, height '5\' 6"'$  (1.676 m), weight 193 lb 1.6 oz (87.6 kg), SpO2 100 %.    HEENT: Neck without mass. Lymphatics: No palpable cervical, supraclavicular, axillary or inguinal lymph nodes. GI: Abdomen soft and nontender.  No hepatomegaly.  No mass. Vascular: No leg edema.   Lab Results:  Lab Results  Component Value Date   WBC 4.4 02/12/2019   HGB 14.1 02/12/2019   HCT 42.3 02/12/2019   MCV 77.3 (L) 02/12/2019   PLT 215 02/12/2019   NEUTROABS 2.4 11/16/2017    Imaging:  Ct Angio Chest Pe W And/or Wo Contrast  Result Date: 02/12/2019 CLINICAL DATA:  Shortness of breath and chest pain. Positive D-dimer study EXAM: CT ANGIOGRAPHY CHEST WITH CONTRAST TECHNIQUE: Multidetector CT imaging of the chest was performed using the standard protocol during bolus administration of intravenous contrast. Multiplanar CT image reconstructions and MIPs were obtained to evaluate the vascular anatomy. CONTRAST:  8m OMNIPAQUE IOHEXOL 350 MG/ML SOLN COMPARISON:  Chest radiograph February 12, 2019 FINDINGS: Cardiovascular: There is no demonstrable pulmonary embolus. There is no thoracic aortic aneurysm or dissection. The visualized great vessels appear unremarkable. There is no  pericardial effusion or pericardial thickening evident. Mediastinum/Nodes: Thyroid appears unremarkable. There is no demonstrable thoracic adenopathy. No esophageal lesions are evident. Lungs/Pleura: There is no edema or consolidation. No pleural effusion evident. Upper Abdomen: Visualized upper abdominal structures appear unremarkable. Musculoskeletal: There are breast implants bilaterally. There are no blastic or lytic bone lesions. Review of the MIP images confirms the above findings. IMPRESSION: 1. No demonstrable pulmonary embolus. No thoracic aortic aneurysm or dissection. 2.  Lungs clear. 3.  No adenopathy evident. 4.  Breast implants bilaterally. Electronically Signed   By: WLowella GripIII M.D.   On: 02/12/2019 17:55    Medications: I have reviewed the patient's current medications.  Assessment/Plan: 1. Rectal cancer, clinical stage T3b,N0,M0  colonoscopy 03/06/2017-biopsy of a rectal mass revealed fragments of an adenomatous lesion with high-grade dysplasia, definite submucosa not available to evaluate for invasion  Proctoscopy/biopsy 03/23/2017 confirmed a mass at 6-7 centimeters from the anal verge, biopsy revealed polypoid colorectal mucosa with detached fragments of adenomatous mucosa  Staging CTs 03/16/2017, anterior rectal mass, no evidence of metastatic disease, no adenopathy,  MRI 03/21/2017-T3b,N0rectal tumor  Initiationof radiation and concurrent Xeloda 04/23/2017.Completed 05/30/2017.  Laparoscopic low anterior resection and diverting ileostomy 07/26/2017,ypT2,ypN0tumor with negative surgical margins, normal mismatch repair protein expression  Cycle 1 adjuvant Xeloda 08/20/2017  Xeloda placed on hold beginning 08/28/2017 due to bilateral leg numbness  Xeloda discontinued  Flexible sigmoidoscopy 10/19/2017- patent end-to-end colocolonic anastomosis characterized by healthy-appearing mucosa. No specimens collected.  Surveillance colonoscopy 07/16/2018- distal  rectum colo-colonic anastomosis normal.  Examination of the colon to the cecum otherwise normal.  No polyps or  cancers.  Repeat colonoscopy in 3 years for surveillance.  2. Hypertension  3. 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 MRI lumbar spine 09/05/2017-no evidence of metastatic disease to the lumbosacral spine. No stenosis or neural compression. Fatty marrow changes from mid L5 through the sacrum presumably secondary to previous radiation.Leg strength improved on exam 09/06/2017. Per patient report numbness improved, still present over the soles of both feet.  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  Neuropathy improved.  6.Ileostomy reversal 11/28/2017  Disposition: Ms. Blizard remains in clinical remission from rectal cancer.  We will follow-up on the CEA from today.  Neuropathy symptoms are better.  She continues follow-up with neurology.  We made a referral to the [facility] dietitian for guidance with food choices due to abdominal symptoms after eating certain foods.  She will return for a CEA and follow-up visit in 6 months.  She will contact the office in the interim with any problems.  Plan reviewed with Dr. Benay Spice.  Ned Card ANP/GNP-BC   02/14/2019  3:25 PM

## 2019-02-17 ENCOUNTER — Telehealth: Payer: Self-pay | Admitting: Oncology

## 2019-02-17 LAB — CEA (IN HOUSE-CHCC): CEA (CHCC-In House): <1 ng/mL (ref 0.00–5.00)

## 2019-02-17 NOTE — Telephone Encounter (Signed)
Called and spoke with patient. Confirmed appt  °

## 2019-02-18 ENCOUNTER — Telehealth: Payer: Self-pay

## 2019-02-18 NOTE — Telephone Encounter (Signed)
TC to pt per Lattie Haw to let her know that the CEA is in normal range. Follow-up as scheduled. Patient verbalized understanding. No further problems or concerns at this time

## 2019-02-24 ENCOUNTER — Inpatient Hospital Stay: Payer: BC Managed Care – PPO | Admitting: Nutrition

## 2019-02-24 NOTE — Progress Notes (Signed)
43 year old female diagnosed with Rectal Cancer. She is a patient of Dr. Benay Spice.  PMH includes HTN, Depression, Ileostomy Reversal April 2019, Stroke, Migraine, and Anemia.  Medications include [facility] Gummies  Labs were reviewed.  Height: 66 inches. Weight: 193.1 pounds. 180 pounds in December 2019. BMI: 31.17 (obese)  Patient is in remission. She complains of abdominal pain, cramping, gas and diarrhea with many vegetables/plant based foods and with dairy foods. She is hoping for guidelines on how to tolerate these foods.  Nutrition Diagnosis: Food and Nutrition Related Knowledge Deficit related to Rectal cancer and associated treatments as evidenced by no prior need for nutrition related information.  Intervention: Educated patient to avoid lactose containing foods and educated her on substitutions she can try in place of these. Educated on gas forming foods and how they can cause distress. Encouraged patient to keep a food journal to identify which foods cause discomfort. She should eliminate these and then slowly try adding them in, one by one, every 2-3 days and document reaction. Suggested lactase tablets and simethicone for symptom management. Questions answered and teach back method used.  Monitoring, Evaluation, Goals: Patient will identify problematic foods and eliminate or eat in small portions as tolerated.  Next Visit: No follow up. Nutrition Diagnosis resolved. Patient has my contact number for questions.

## 2019-02-25 ENCOUNTER — Encounter: Payer: Self-pay | Admitting: Nurse Practitioner

## 2019-02-26 ENCOUNTER — Other Ambulatory Visit: Payer: Self-pay

## 2019-02-26 ENCOUNTER — Inpatient Hospital Stay: Payer: BC Managed Care – PPO | Admitting: Nurse Practitioner

## 2019-03-03 ENCOUNTER — Encounter: Payer: Self-pay | Admitting: Neurology

## 2019-03-03 ENCOUNTER — Other Ambulatory Visit: Payer: Self-pay

## 2019-03-03 ENCOUNTER — Ambulatory Visit (INDEPENDENT_AMBULATORY_CARE_PROVIDER_SITE_OTHER): Payer: BC Managed Care – PPO | Admitting: Neurology

## 2019-03-03 VITALS — BP 138/92 | HR 78 | Temp 98.5°F | Ht 66.0 in | Wt 198.0 lb

## 2019-03-03 DIAGNOSIS — G629 Polyneuropathy, unspecified: Secondary | ICD-10-CM

## 2019-03-03 DIAGNOSIS — R413 Other amnesia: Secondary | ICD-10-CM

## 2019-03-03 MED ORDER — PREGABALIN 75 MG PO CAPS: ORAL_CAPSULE | ORAL | 5 refills | Status: DC

## 2019-03-03 NOTE — Progress Notes (Signed)
NEUROLOGY FOLLOW UP OFFICE NOTE  Lometa Riggin [id number] Jul 18, 1976  HISTORY OF PRESENT ILLNESS: I had the pleasure of seeing Makenzee Choudhry in follow-up in the neurology clinic on 03/03/2019.  The patient was last seen 7 months ago for bilateral leg numbness and weakness, with an increase in falls. She also reported memory changes. She has a history of rectal cancer s/p resection, radiation, and treatment with Xeloda. She stopped Xeloda in January 2019 with note of stability of paresthesias, but worsening weakness leading to several falls. Imaging reviewed, she has had an MRI of the cervical, thoracic, and lumbar spine with no significant abnormalities seen. 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 She had an MRI brain with and without contrast which was normal.  She has had a lumbar puncture with normal CSF studies (normal cell count, protein, IgG, myelin basic protein, oligoclonal bands, ACE. Cytology done due to low cell viability.   Since her last visit, she reports that she had been doing overall fine. She was taking Lyrica 75mg  in AM, 150mg  in PM with good response having small neuropathy issues here and there, however she ran out of medication. She still has balance issues at times, she has not had any falls because at her new job she is not standing all day. 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 She had been off her HTN medication for 3 weeks and was having headaches. She was in the hospital for chest pain 3 weeks ago, she noticed numbness and tingling up her legs, stumbling with her leg giving out. She has a headache today. She reports chest pain "all the time," with sharper chest pain. She was told she had an enlarged heart.   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 She had an MRI lumbar spine in 08/2017 with no evidence of metastatic disease, stenosis, or neural compression. There were fatty marrow changes from mid-L5 through the sacrum presumably secondary to previous radiation. Numbness had improved but she continued to report numbness in both soles. She states symptoms became more noticeable with pins and needles in her feet around December 2018. After her second surgery in April, she started noticing the paresthesias shooting up her legs above knee level. She was started on gabapentin but it did not help. She denied any burning/stabbing pain. She reports hands were unaffected. Since stopping Xeloda in January, the paresthesias do not go up to knee level anymore, mostly affecting her feet like she is walking on pebbles all the time, but now noticing leg weakness. She has difficulty going up stairs and has had falls because her legs would give out. She has fallen down stairs and fell in a bowling alley last August. She denies any neck or back pain. She denies any headaches, dizziness, diplopia, dysarthria/dysphagia. She has frequent bowel movements, no urinary issues. She works at [employer] and [employer] and stands all day. She has also been noticing memory issues. Cognitive issues were attributed to gabapentin, but she states she noticed it  before starting medication, but it was more noticeable with the gabapentin. She has difficulty remembering names, completing sentences, remembering what she was supposed to do. She compensates by taking a lot of notes at work. She denies getting lost driving and uses her GPS all the time. She lives with her boyfriend and 3 children, denies  missing bill payments and medication. Her boyfriend notes she repeats herself when she gets distracted. They both note increased irritability and anxiety with stairs. She denies any family history of memory issues or neuropathy, no history of concussions or alcohol intake  PAST MEDICAL HISTORY: Past Medical History:  Diagnosis Date  . Anemia   . Anxiety   . Colon cancer ([place])    rectal  . Complication of anesthesia    during colonoscopy and wisdom teeth extraction  . Family history of breast cancer   . HTN (hypertension)   . Migraine   . Stroke ([place])    sickle cell trait    MEDICATIONS: Current Outpatient Medications on File Prior to Visit  Medication Sig Dispense Refill  . acetaminophen (TYLENOL) 500 MG tablet Take 500-1,000 mg by mouth every 6 (six) hours as needed (for headaches).     Marland Kitchen amLODipine (NORVASC) 10 MG tablet Take 1 tablet (10 mg total) by mouth daily. 30 tablet 0  . [employer] FIBER GUMMIES PO Take 2 each by mouth daily.    . hydrochlorothiazide (HYDRODIURIL) 25 MG tablet Take 1 tablet (25 mg total) by mouth daily. 30 tablet 0  . [DISCONTINUED] pregabalin (LYRICA) 75 MG capsule Take 1 cap in Am, 2 caps in PM 90 capsule 5   No current facility-administered medications on file prior to visit.     ALLERGIES: Allergies  Allergen Reactions  . Dilaudid [Hydromorphone Hcl] Itching  . Latex Rash and Other (See Comments)    Skin sensitivity     FAMILY HISTORY: Family History  Problem Relation Age of Onset  . Heart attack Mother   . Diabetes Mother   . Hypertension Mother   . Stroke Mother   . Breast cancer Other 75  . Hypertension Father   . Stroke Father   . Seizures Father   . Diabetes Maternal Grandmother   . Asthma Son   . Asthma Son   . Colon cancer Neg Hx   . Esophageal cancer Neg Hx   . Rectal cancer Neg Hx   . Stomach cancer Neg Hx     SOCIAL HISTORY: Social History   Socioeconomic History  . Marital status: Single    Spouse name: Not on file  .  Number of children: Not on file  . Years of education: Not on file  . Highest education level: Not on file  Occupational History  . Not on file  Social Needs  . Financial resource strain: Not on file  . Food insecurity    Worry: Not on file    Inability: Not on file  . Transportation needs    Medical: Not on file    Non-medical: Not on file  Tobacco Use  . Smoking status: Never Smoker  . Smokeless tobacco: Never Used  Substance and Sexual Activity  . Alcohol use: Yes    Alcohol/week: 1.0 standard drinks    Types: 1 Glasses of wine per week    Comment: in social settings  . Drug use: No  . Sexual activity: Yes  Lifestyle  . Physical activity    Days per week: Not on file    Minutes per  session: Not on file  . Stress: Not on file  Relationships  . Social Herbalist on phone: Not on file    Gets together: Not on file    Attends religious service: Not on file    Active member of club or organization: Not on file    Attends meetings of clubs or organizations: Not on file    Relationship status: Not on file  . Intimate partner violence    Fear of current or ex partner: Not on file    Emotionally abused: Not on file    Physically abused: Not on file    Forced sexual activity: Not on file  Other Topics Concern  . Not on file  Social History Narrative   Pt lives in single story home (on the second floor) with her 3 children and her significant other   Has bachelors degree   Works in Press photographer     REVIEW OF SYSTEMS: Constitutional: No fevers, chills, or sweats, no generalized fatigue, change in appetite Eyes: No visual changes, double vision, eye pain Ear, nose and throat: No hearing loss, ear pain, nasal congestion, sore throat Cardiovascular: No chest pain, palpitations Respiratory:  No shortness of breath at rest or with exertion, wheezes GastrointestinaI: No nausea, vomiting, diarrhea, abdominal pain, fecal incontinence Genitourinary:  No dysuria, urinary  retention or frequency Musculoskeletal:  No neck pain, back pain Integumentary: No rash, pruritus, skin lesions Neurological: as above Psychiatric: No depression, insomnia, anxiety Endocrine: No palpitations, fatigue, diaphoresis, mood swings, change in appetite, change in weight, increased thirst Hematologic/Lymphatic:  No anemia, purpura, petechiae. Allergic/Immunologic: no itchy/runny eyes, nasal congestion, recent allergic reactions, rashes  PHYSICAL EXAM: Vitals:   03/03/19 1614  BP: (!) 138/92  Pulse: 78  Temp: 98.5 F (36.9 C)  SpO2: 98%   General: No acute distress Head:  Normocephalic/atraumatic Skin/Extremities: No rash, no edema Neurological Exam: alert and oriented to person, place, and time. No aphasia or dysarthria. Fund of knowledge is appropriate.  Recent and remote memory are intact.  Attention and concentration are normal.    Able to name objects and repeat phrases. Cranial nerves: Pupils equal, round, reactive to light. Extraocular movements intact with no nystagmus. Visual fields full. Facial sensation intact. No facial asymmetry. Tongue, uvula, palate midline.  Motor: Bulk and tone normal, muscle strength 5/5 throughout with no pronator drift.  Sensation decreased in both LE. Deep tendon reflexes brisk +2 on both UE, unable to elicit on both LE. Gait narrow-based and steady, mild difficulty with tandem walk (similar to prior). Positive Romberg test.  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 EMG/NCV of both legs did not show evidence of a large fiber neuropathy, however test is not sensitive for small fiber neuropathy. She had good response to neuropathy,  restart Lyrica 75mg  in AM, 150mg  in PM. Headaches have been occurring in setting of elevated BP. She is also reporting chest pain. Refer to Cardiology. She continues to report memory issues, proceed with Neurocognitive testing as previously discussed. Follow-up in 6 months, she knows to call for any changes.   Thank you for allowing me to participate in her care.  Please do not hesitate to call for any questions or concerns.  The  duration of this appointment visit was 30 minutes of face-to-face time with the patient.  Greater than 50% of this time was spent in counseling, explanation of diagnosis, planning of further management, and coordination of care.   Ellouise Newer, M.D.

## 2019-03-03 NOTE — Patient Instructions (Addendum)
1. Restart Lyrica 75mg : Take 1 in AM, 2 in PM  2. Refer for Neurocognitive testing  3. Refer to Cardiology for chest pain, HTN  4. Follow-up in 6 months, call for any changes

## 2019-03-27 ENCOUNTER — Other Ambulatory Visit: Payer: Self-pay

## 2019-03-27 ENCOUNTER — Encounter: Payer: Self-pay | Admitting: Cardiology

## 2019-03-27 ENCOUNTER — Ambulatory Visit (INDEPENDENT_AMBULATORY_CARE_PROVIDER_SITE_OTHER): Payer: Medicaid Other | Admitting: Cardiology

## 2019-03-27 VITALS — BP 155/106 | HR 70 | Ht 66.0 in | Wt 195.0 lb

## 2019-03-27 DIAGNOSIS — I10 Essential (primary) hypertension: Secondary | ICD-10-CM

## 2019-03-27 DIAGNOSIS — R0609 Other forms of dyspnea: Secondary | ICD-10-CM

## 2019-03-27 DIAGNOSIS — R072 Precordial pain: Secondary | ICD-10-CM

## 2019-03-27 DIAGNOSIS — R079 Chest pain, unspecified: Secondary | ICD-10-CM

## 2019-03-27 MED ORDER — CARVEDILOL 6.25 MG PO TABS: 6.2500 mg | ORAL_TABLET | Freq: Two times a day (BID) | ORAL | 11 refills | Status: DC

## 2019-03-27 NOTE — Progress Notes (Signed)
Patient referred by Cameron Sprang, MD for chest pain.  Subjective:   Donna Brandt, female    DOB: June 24, 1976, 43 y.o.   MRN: [id number]  Chief Complaint  Patient presents with  . Chest Pain  . Hypertension    HPI  43 y.o. 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   Patient was [facility] to [facility] in [facility]/2019 with chest pain and hypertensive urgency. HS trop X2 were negative.    It appears that she does not have a PCP and had not been on antihypertensives. She was placed on amlodipine and hydrochlorothiazide. Blood pressure is now better controlled.   She usually does not have pain at rest. She works at [employer], where she has to walk quite a bit. She has had retrosternal chest pain, worse with walking. She also reports dyspnea on exertion. Denies orthopnea, PND, leg edema.   Patient has family history significant for stroke and MI in 42s and 38s. She also has strong family history of hypertension. She has not had lipid panel before.   Past Medical History:  Diagnosis Date  . Anemia   . Anxiety   . Colon cancer ([place])    rectal  . Complication of anesthesia    during colonoscopy and wisdom teeth extraction  . Family history of breast cancer   . HTN (hypertension)   . Migraine   . Stroke ([place])    sickle cell trait     Past Surgical History:  Procedure Laterality Date  . ABDOMINAL HYSTERECTOMY     due to uterine fibroids  . AUGMENTATION MAMMAPLASTY Bilateral   . ENDOMETRIAL ABLATION    . FLEXIBLE SIGMOIDOSCOPY N/A 04/12/2017   Procedure: FLEXIBLE SIGMOIDOSCOPY;  Surgeon: Milus Banister, MD;  Location: [facility];  Service: Endoscopy;  Laterality: N/A;  . FLEXIBLE SIGMOIDOSCOPY N/A 07/18/2017   Procedure: FLEXIBLE  SIGMOIDOSCOPY EXAM UNDER ANESTHESIA;  Surgeon: Ileana Roup, MD;  Location: [facility];  Service: General;  Laterality: N/A;  . FLEXIBLE SIGMOIDOSCOPY  07/26/2017   Procedure: FLEXIBLE SIGMOIDOSCOPY;  Surgeon: Ileana Roup, MD;  Location: [facility];  Service: General;;  . FLEXIBLE SIGMOIDOSCOPY N/A 10/19/2017   Procedure: FLEXIBLE SIGMOIDOSCOPY;  Surgeon: Ileana Roup, MD;  Location: [facility];  Service: General;  Laterality: N/A;  . ILEOSTOMY N/A 11/28/2017   Procedure: CLOSURE OF LOOP ILEOSTOMY ERAS PATHWAY;  Surgeon: Ileana Roup, MD;  Location: [facility];  Service: General;  Laterality: N/A;  . ILEOSTOMY REVISION     11-28-17  . LAPAROSCOPIC LOW ANTERIOR RESECTION N/A 07/26/2017   Procedure: LAPAROSCOPIC VS OPEN  LOW ANTERIOR RESECTION WITH Peggs LOOP ILEOSTOMY;  Surgeon: Ileana Roup, MD;  Location: [facility];  Service: General;  Laterality: N/A;  . PROCTOSCOPY  07/26/2017   Procedure: PROCTOSCOPY;  Surgeon: Ileana Roup, MD;  Location: [facility];  Service: General;;  . TONSILLECTOMY    . TUBAL LIGATION    . WISDOM TOOTH EXTRACTION       Social History   Socioeconomic History  . Marital status: Single    Spouse name: Not on file  . Number of children: 3  . Years of education: Not on file  . Highest education level: Not on file  Occupational History  . Not on file  Social Needs  . Financial  resource strain: Not on file  . Food insecurity    Worry: Not on file    Inability: Not on file  . Transportation needs    Medical: Not on file    Non-medical: Not on file  Tobacco Use  . Smoking status: Never Smoker  . Smokeless tobacco: Never Used  Substance and Sexual Activity  . Alcohol use: Yes    Alcohol/week: 1.0 standard drinks    Types: 1 Glasses of wine per week    Comment: in social settings  . Drug use: No  . Sexual activity: Yes  Lifestyle  . Physical activity    Days per week: Not on file    Minutes per session: Not on  file  . Stress: Not on file  Relationships  . Social Herbalist on phone: Not on file    Gets together: Not on file    Attends religious service: Not on file    Active member of club or organization: Not on file    Attends meetings of clubs or organizations: Not on file    Relationship status: Not on file  . Intimate partner violence    Fear of current or ex partner: Not on file    Emotionally abused: Not on file    Physically abused: Not on file    Forced sexual activity: Not on file  Other Topics Concern  . Not on file  Social History Narrative   Pt lives in single story home (on the second floor) with her 3 children and her significant other   Has bachelors degree   Works in Press photographer      Family History  Problem Relation Age of Onset  . Heart attack Mother   . Diabetes Mother   . Hypertension Mother   . Breast cancer Other 75  . Hypertension Father   . Stroke Father   . Seizures Father   . Diabetes Maternal Grandmother   . Asthma Son   . Asthma Son   . Colon cancer Neg Hx   . Esophageal cancer Neg Hx   . Rectal cancer Neg Hx   . Stomach cancer Neg Hx      Current Outpatient Medications on File Prior to Visit  Medication Sig Dispense Refill  . acetaminophen (TYLENOL) 500 MG tablet Take 500-1,000 mg by mouth every 6 (six) hours as needed (for headaches).     Marland Kitchen amLODipine (NORVASC) 10 MG tablet Take 1 tablet (10 mg total) by mouth daily. 30 tablet 0  . [employer] FIBER GUMMIES PO Take 2 each by mouth daily.    . hydrochlorothiazide (HYDRODIURIL) 25 MG tablet Take 1 tablet (25 mg total) by mouth daily. 30 tablet 0  . pregabalin (LYRICA) 75 MG capsule Take 1 cap in Am, 2 caps in PM 90 capsule 5   No current facility-administered medications on file prior to visit.     Cardiovascular studies:  EKG 03/27/2019: Sinus rhythm 70 bpm.  Normal EKG.   EKG 02/12/2019: Sinus rhythm 78 bpm. Nonspecific ST-T abnormality inferior leads.    Recent labs: Results for  Donna Brandt, Donna Brandt (MRN [id number]) as of 03/27/2019 10:34  Ref. Range 02/12/2019 12:55 02/12/2019 14:52 02/12/2019 63:01  BASIC METABOLIC PANEL Unknown Rpt (A)    Sodium Latest Ref Range: 135 - 145 mmol/L 139    Potassium Latest Ref Range: 3.5 - 5.1 mmol/L 4.0    Chloride Latest Ref Range: 98 - 111 mmol/L 103    CO2 Latest Ref Range:  22 - 32 mmol/L 24    Glucose Latest Ref Range: 70 - 99 mg/dL 93    BUN Latest Ref Range: 6 - 20 mg/dL 8    Creatinine Latest Ref Range: 0.44 - 1.00 mg/dL 0.56    Calcium Latest Ref Range: 8.9 - 10.3 mg/dL 10.6 (H)    Anion gap Latest Ref Range: 5 - 15  12    GFR, Est Non African American Latest Ref Range: >60 mL/min >60    GFR, Est African American Latest Ref Range: >60 mL/min >60    B Natriuretic Peptide Latest Ref Range: 0.0 - 100.0 pg/mL   19.4  Troponin I (High Sensitivity) Latest Ref Range: <18 ng/L <2 <2    Results for Donna Brandt, Donna Brandt (MRN [id number]) as of 03/27/2019 10:34  Ref. Range 02/12/2019 12:55  WBC Latest Ref Range: 4.0 - 10.5 K/uL 4.4  RBC Latest Ref Range: 3.87 - 5.11 MIL/uL 5.47 (H)  Hemoglobin Latest Ref Range: 12.0 - 15.0 g/dL 14.1  HCT Latest Ref Range: 36.0 - 46.0 % 42.3  MCV Latest Ref Range: 80.0 - 100.0 fL 77.3 (L)  MCH Latest Ref Range: 26.0 - 34.0 pg 25.8 (L)  MCHC Latest Ref Range: 30.0 - 36.0 g/dL 33.3  RDW Latest Ref Range: 11.5 - 15.5 % 13.0  Platelets Latest Ref Range: 150 - 400 K/uL 215  nRBC Latest Ref Range: 0.0 - 0.2 % 0.0    Review of Systems  Constitution: Negative for decreased appetite, malaise/fatigue, weight gain and weight loss.  HENT: Negative for congestion.   Eyes: Negative for visual disturbance.  Cardiovascular: Positive for chest pain and dyspnea on exertion. Negative for leg swelling, palpitations and syncope.  Respiratory: Positive for shortness of breath. Negative for cough.   Endocrine: Negative for cold intolerance.  Hematologic/Lymphatic: Does not bruise/bleed easily.  Skin: Negative for itching and rash.   Musculoskeletal: Negative for myalgias.  Gastrointestinal: Negative for abdominal pain, nausea and vomiting.  Genitourinary: Negative for dysuria.  Neurological: Negative for dizziness and weakness.  Psychiatric/Behavioral: The patient is not nervous/anxious.   All other systems reviewed and are negative.       Vitals:   03/27/19 1405  BP: (!) 155/106  Pulse: 70  SpO2: 98%     Body mass index is 31.47 kg/m. Filed Weights   03/27/19 1405  Weight: 88.5 kg    Objective:   Physical Exam  Constitutional: She is oriented to person, place, and time. She appears well-developed and well-nourished. No distress.  HENT:  Head: Normocephalic and atraumatic.  Eyes: Pupils are equal, round, and reactive to light. Conjunctivae are normal.  Neck: No JVD present.  Cardiovascular: Normal rate, regular rhythm and intact distal pulses.  Pulmonary/Chest: Effort normal and breath sounds normal. She has no wheezes. She has no rales.  Abdominal: Soft. Bowel sounds are normal. There is no rebound.  Musculoskeletal:        General: No edema.  Lymphadenopathy:    She has no cervical adenopathy.  Neurological: She is alert and oriented to person, place, and time. No cranial nerve deficit.  Skin: Skin is warm and dry.  Psychiatric: She has a normal mood and affect.  Nursing note and vitals reviewed.         Assessment & Recommendations:   43 y.o. 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   Chest pain, dyspnea on exertion: While hypertension is likely etiology, she has risk factors for CAD-primarily due to family history. Start carvedilol 6.25 mg bid. Recommend echocardiogram and coronary CT angiogram. CT angiogram with calcium score will also help in risk stratification with lipid  therapy.  Further recommendations after above testing.    Thank you for referring the patient to [place]. Please feel free to contact with any questions.  Nigel Mormon, MD [facility] Pager: [phone] Office: [phone] If no answer Cell [phone]

## 2019-04-01 ENCOUNTER — Ambulatory Visit: Payer: BC Managed Care – PPO | Admitting: Psychology

## 2019-04-01 ENCOUNTER — Encounter: Payer: Self-pay | Admitting: Psychology

## 2019-04-01 ENCOUNTER — Ambulatory Visit (INDEPENDENT_AMBULATORY_CARE_PROVIDER_SITE_OTHER): Payer: BC Managed Care – PPO | Admitting: Psychology

## 2019-04-01 ENCOUNTER — Other Ambulatory Visit: Payer: Self-pay

## 2019-04-01 DIAGNOSIS — F330 Major depressive disorder, recurrent, mild: Secondary | ICD-10-CM | POA: Diagnosis not present

## 2019-04-01 DIAGNOSIS — R413 Other amnesia: Secondary | ICD-10-CM

## 2019-04-01 DIAGNOSIS — F4322 Adjustment disorder with anxiety: Secondary | ICD-10-CM

## 2019-04-01 NOTE — Progress Notes (Signed)
   Neuropsychology Note   Donna Brandt completed 120 minutes of neuropsychological testing with technician, Milana Kidney, B.S., under the supervision of Dr. Christia Reading, Ph.D., licensed psychologist. The patient did not appear overtly distressed by the testing session, per behavioral observation or via self-report to the technician. Rest breaks were offered.    In considering the patient's current level of functioning, level of presumed impairment, nature of symptoms, emotional and behavioral responses during the interview, level of literacy, and observed level of motivation/effort, a battery of tests was selected and communicated to the psychometrician.   Communication between the psychologist and technician was ongoing throughout the testing session and changes were made as deemed necessary based on patient performance on testing, technician observations and additional pertinent factors such as those listed above.   Donna Brandt will return within approximately two weeks for an interactive feedback session with Dr. Melvyn Novas at which time his test performances, clinical impressions, and treatment recommendations will be reviewed in detail. The patient understands she can contact our office should she require our assistance before this time.   Full report to follow.  120 minutes were spent face-to-face with patient administering standardized tests and 15 minutes were spent scoring (technician). [CPT [id number], [id number]

## 2019-04-01 NOTE — Progress Notes (Signed)
NEUROPSYCHOLOGICAL EVALUATION [place]. [place] Department of Neurology  Reason for Referral:   Donna Brandt is a 43 y.o. African-American female referred by Ellouise Newer, M.D., to characterize her current cognitive functioning and assist with diagnostic clarity and treatment planning in the context of subjective cognitive difficulties stemming from a history of colon cancer with subsequent radiation and oral chemotherapy treatment, as well as potential psychiatric comorbidities.  Assessment and Plan:   Clinical Impression(s): Overall, Donna Brandt's pattern of performance is suggestive of neuropsychological functioning within normal limits. Subtle weaknesses were observed across unstructured tasks assessing verbal memory (i.e., retrieval) and executive functioning. However, these scores largely remained within normal limits. A normative weakness was also exhibited across confrontation naming task. Scores across cognitive domains including processing speed, basic and complex attention, other aspects of executive functioning not described above, receptive language, verbal fluency, visuospatial abilities, structured verbal memory, and visual learning and memory were within normal limits.  Factors which can create and maintain cognitive inefficiencies include a prior history of cancer-related radiation treatment and oral chemotherapy medication side effects, as well as significant psychiatric distress. Regarding the latter, responses across self-report measures suggested moderate levels of both anxiety and depression occurring over the previous 1-2 weeks. As such, it is likely that a combination of these factors are responsible for day-to-day cognitive difficulties which Donna Brandt has been experiencing presently, with the primary culprit being ongoing psychiatric distress.  Recommendations: -A combination of medication and psychotherapy has been shown to be most effective  at treating symptoms of anxiety and depression. As such, Donna Brandt is encouraged to speak with her prescribing physician regarding possible medication adjustments to address these symptoms. Additionally, she should consider engaging in short-term individual psychotherapy to help process difficulties surrounding her post-cancer adjustment and develop coping strategies for dealing with distress.   -When possible, she may benefit from information being broken up into small, manageable pieces. She may also find it helpful to articulate the material in her own words and in a context to promote encoding at the onset of a new task.  -Memory can be improved using internal strategies such as rehearsal, repetition, chunking, mnemonics, association, and imagery. External strategies such as written notes in a consistently used memory journal, visual and nonverbal auditory cues such as a calendar on the refrigerator or appointments with alarm, such as on a cell phone, can also help maximize recall.    To address problems with complex attention, she may wish to consider:   -Avoiding external distractions (TV, music, background noise/conversations, etc.) when needing to concentrate   -Writing down complicated information and using checklists, rather than holding everything in mind   -Attempting and completing one task at a time before moving on to the next step/task (i.e., no multi-tasking)   -Verbalizing aloud each step of a task to maintain focus   -Reducing the amount of information considered at one time  Review of Records:   Donna Brandt was seen by [facility] Ellouise Newer, M.D.) on 03/03/2019 for follow-up of symptoms of neuropathy and subjective cognitive decline. Regarding the latter, cognitive difficulties were said to surround short-term memory. Examples included difficulty remembering names, completing sentences, and remembering what she was supposed to do. Initially, memory concerns were  attributed to medication side effects (gabapentin). However, Donna Brandt clarified that gabapentin likely exacerbated already present difficulties. Subsequently, she was referred for a comprehensive neuropsychological evaluation to characterize her cognitive abilities and assist with diagnostic clarity and future treatment planning.  Brain MRI on 04/18/2018 was unremarkable.  Past Medical History:  Diagnosis Date   Anemia    Anxiety    Colon cancer ([place])    rectal   Complication of anesthesia    during colonoscopy and wisdom teeth extraction   HTN (hypertension)    Migraine     Past Surgical History:  Procedure Laterality Date   ABDOMINAL HYSTERECTOMY     due to uterine fibroids   AUGMENTATION MAMMAPLASTY Bilateral    ENDOMETRIAL ABLATION     FLEXIBLE SIGMOIDOSCOPY N/A 04/12/2017   Procedure: FLEXIBLE SIGMOIDOSCOPY;  Surgeon: Milus Banister, MD;  Location: [facility];  Service: Endoscopy;  Laterality: N/A;   FLEXIBLE SIGMOIDOSCOPY N/A 07/18/2017   Procedure: FLEXIBLE SIGMOIDOSCOPY EXAM UNDER ANESTHESIA;  Surgeon: Ileana Roup, MD;  Location: [facility];  Service: General;  Laterality: N/A;   FLEXIBLE SIGMOIDOSCOPY  07/26/2017   Procedure: FLEXIBLE SIGMOIDOSCOPY;  Surgeon: Ileana Roup, MD;  Location: [facility];  Service: General;;   FLEXIBLE SIGMOIDOSCOPY N/A 10/19/2017   Procedure: FLEXIBLE SIGMOIDOSCOPY;  Surgeon: Ileana Roup, MD;  Location: [facility];  Service: General;  Laterality: N/A;   ILEOSTOMY N/A 11/28/2017   Procedure: CLOSURE OF LOOP ILEOSTOMY ERAS PATHWAY;  Surgeon: Ileana Roup, MD;  Location: [facility];  Service: General;  Laterality: N/A;   ILEOSTOMY REVISION     11-28-17   LAPAROSCOPIC LOW ANTERIOR RESECTION N/A 07/26/2017   Procedure: LAPAROSCOPIC VS OPEN  LOW ANTERIOR RESECTION WITH [place];  Surgeon: Ileana Roup, MD;  Location: [facility];  Service: General;  Laterality: N/A;   PROCTOSCOPY   07/26/2017   Procedure: PROCTOSCOPY;  Surgeon: Ileana Roup, MD;  Location: [facility];  Service: General;;   TONSILLECTOMY     TUBAL LIGATION     WISDOM TOOTH EXTRACTION      Family History  Problem Relation Age of Onset   Heart attack Mother    Diabetes Mother    Hypertension Mother    Stroke Mother    Breast cancer Other 34   Hypertension Father    Stroke Father    Seizures Father    Diabetes Maternal Grandmother    Asthma Son    Asthma Son    Colon cancer Neg Hx    Esophageal cancer Neg Hx    Rectal cancer Neg Hx    Stomach cancer Neg Hx      Current Outpatient Medications:    acetaminophen (TYLENOL) 500 MG tablet, Take 500-1,000 mg by mouth every 6 (six) hours as needed (for headaches). , Disp: , Rfl:    amLODipine (NORVASC) 10 MG tablet, Take 1 tablet (10 mg total) by mouth daily., Disp: 30 tablet, Rfl: 0   carvedilol (COREG) 6.25 MG tablet, Take 1 tablet (6.25 mg total) by mouth 2 (two) times daily., Disp: 60 tablet, Rfl: 11   [employer] FIBER GUMMIES PO, Take 2 each by mouth daily., Disp: , Rfl:    hydrochlorothiazide (HYDRODIURIL) 25 MG tablet, Take 1 tablet (25 mg total) by mouth daily., Disp: 30 tablet, Rfl: 0   pregabalin (LYRICA) 75 MG capsule, Take 1 cap in Am, 2 caps in PM, Disp: 90 capsule, Rfl: 5  Clinical Interview:   Cognitive Symptoms: Decreased short-term memory: Endorsed. Donna Brandt. Duy described difficulties remembering the names of familiar individuals and details of previous conversations. She also noted frequently forgetting words she intends to use in conversation, as well as losing her train of thought and "going blank."   Decreased long-term memory:  Denied. Decreased attention/concentration: Endorsed. She described difficulties maintaining her focus, paying attention, and losing her train of thought, as well as increased distractibility. Reduced processing speed: Endorsed. She described her mental processing as a "lot slower" and  feeling "less sharp."  Difficulties with executive functions: Endorsed. Difficulties surrounding organization and indecisiveness were reported. She denied trouble with impulsivity or making poor or unsafe judgments.  Difficulties with emotion regulation: Denied. Difficulties with receptive language: Endorsed. However, difficulties were primarily attributed to subjective deficits in processing speed. Difficulties with word finding: Endorsed. However, difficulties were primarily attributed to subjective difficulties with memory. Decreased visuoperceptual ability: Denied.  Trajectory of deficits: Cognitive deficits were said to first become noticeable following treatment for colon cancer (2018) and a second related surgery in April 2019. Since then, these difficulties were said to exhibit a gradual decline. However, Donna Brandt did acknowledge it being hard to tell how worse they are relative to prior to her cancer diagnosis and subsequent treatment.  Difficulties completing ADLs: Denied.  Additional Medical History: History of traumatic brain injury/concussion: Denied. History of stroke: Denied. History of seizure activity: Denied. History of known exposure to toxins: Denied. Symptoms of chronic pain: Endorsed. 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 Experience of frequent headaches/migraines: Endorsed. She reported a remote history of migraine headaches; however, symptoms have improved over time after being prescribed various medications. Currently, she described frequent "dull" headaches, which were largely attributed to stress. Frequent instances of dizziness/vertigo: Denied.  Sensory changes: Denied. Balance/coordination difficulties: Generally denied. While balance was described as "good now," she did acknowledge a history of falls as recent as a few months prior due to symptoms of neuropathy.  Other motor difficulties:  Denied.  Other medical conditions: Endorsed. Donna Brandt was diagnosed with colon (rectal) cancer in 2018. She completed 10.5 weeks of radiation treatment (5 days per week), while also taking oral chemotherapy medications (Xeloda). She noted that she was unable to continue oral chemotherapy medications post-radiation (stopped in January 2019) due to neuropathy exacerbations.  Sleep History: Estimated hours obtained each night: 6 hours. Difficulties falling asleep: Denied. Difficulties staying asleep: Denied. Feels rested and refreshed upon awakening: Denied.  History of snoring: Endorsed. History of waking up gasping for air: Denied. Witnessed breath cessation while asleep: Denied.  History of vivid dreaming: Denied. Excessive movement while asleep: Denied. Instances of acting out her dreams: Denied.  Psychiatric/Behavioral Health History: Depression: Endorsed. Donna Brandt reported a history of "slight" depression, which was present prior to her cancer diagnosis and subsequent treatment. She reported taking anti-depressant medications with positive effect. Currently, she described her mood as "ok." Residual stressors were said to surround challenges getting her "body back to normal" following cancer treatment. Current or remote suicidal ideation, intent, or plan was denied.  Anxiety: Endorsed. She reported "slight" symptoms of anxiety, which were attributed to her cancer diagnosis and subsequent treatment. These symptoms were also said to be treated well with current medications. Mania: Denied. Trauma History: Denied. Visual/auditory hallucinations: Denied. Delusional thoughts: Denied.  Tobacco: Denied. Alcohol: Donna Brandt reported occasionally consuming alcoholic beverages in social settings. She denied a history of problematic alcohol use, abuse, or dependence.  Recreational drugs: Denied. Caffeine: Endorsed. However, caffeine consumption was generally limited to 0-2 caffeinated  sodas per week.  Academic/Vocational History: Highest level of educational attainment: 16 years. 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 She described herself as an average (B/C)  student in academic settings.  History of developmental delay: Denied. History of grade repetition: Denied. History of class failures: Denied. Enrollment in special education courses: Denied. History of diagnosed specific learning disability: Denied. History of ADHD: Denied.  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   Evaluation Results:   Behavioral Observations: Donna Brandt was unaccompanied, arrived to her appointment on time, and was appropriately dressed and groomed. Observed gait and station were within normal limits. Gross motor functioning appeared intact upon informal observation and no abnormal movements (e.g., tremors) were noted. Her affect was generally relaxed and positive, but did range appropriately given the subject being discussed during the clinical interview or the task at hand during testing procedures. Spontaneous speech was fluent and word finding difficulties were not observed during the clinical interview or testing procedures. Sustained attention was appropriate throughout. Thought processes were coherent, organized, and normal in content. Task engagement was adequate and she persisted well when challenged. Overall, Donna Brandt was cooperative with the clinical interview and subsequent testing procedures.   Adequacy of Effort: The validity of neuropsychological testing is limited by the extent to which the individual being tested may be assumed to have exerted adequate effort during testing. Donna Brandt expressed her intention to perform to the best of her abilities and exhibited adequate task engagement and persistence. Scores across stand-alone and  embedded performance validity measures were within expectation. As such, the results of the current evaluation are believed to be a valid representation of Donna Brandt's current cognitive functioning.  Test Results: Donna Brandt was fully oriented at the time of the current evaluation.  Intellectual abilities based upon educational and vocational attainment were estimated to be in the average range. Premorbid abilities were estimated to be within the well below average range based upon a single-word reading test (TOPF).   Processing speed was within normal limits. Basic attention was within normal limits. More complex attention (e.g., working memory) was within normal limits. Performance across tasks assessing executive functions (e.g., cognitive flexibility, response inhibition, nonverbal abstract reasoning, analytical problem solving) were generally within normal limits. Subtle difficulties (e.g., perseverative response patterns) were observed across an unstructured task assessing concept formation and pattern recognition (WCST), as well as a task assessing verbal cognitive flexibility.  Assessed receptive language abilities were within normal limits. Likewise, Donna Brandt. [place]ot did not exhibit any difficulties comprehending task instructions and answered all questions asked of her appropriately. Assessed expressive language (e.g., verbal fluency and confrontation naming) was variable but believed to be within normal limits. Confrontation naming represented a normative weakness. However, Donna Brandt. [place] answered 27/31 responses correctly and benefited from the provision of phonemic cues. Assessed phonemic and semantic fluency were within normal limits.   Assessed visuospatial/visuoconstructional abilities were within normal limits.    Learning (i.e., encoding) of novel verbal and visual information was within normal limits. Spontaneous delayed recall (i.e., retrieval) of previously learned information was  generally commensurate with performance across initial learning trials. However, a relative weakness was exhibited across a unstructured list learning task. Retention rates were appropriate across memory measures. Performance across recognition tasks was likewise appropriate, suggesting evidence for appropriate information consolidation.   Results of emotional screening instruments suggested that recent symptoms of generalized anxiety were in the moderate range, while symptoms of depression were also within the moderate range.   Tables of Scores:   Note: This summary of test scores accompanies the interpretive report and should not be considered in isolation without reference  to the appropriate sections in the text. Descriptors are based on appropriate normative data and may be adjusted based on clinical judgment. The terms impaired and within normal limits (WNL) are used when a more specific level of functioning cannot be determined.       Effort Testing:   DESCRIPTOR       [employer] (ACS) Word Choice: --- --- Within Expectation  CVLT-III Forced Choice Recognition: --- --- Within Expectation  BVMT-R Retention Percentage: --- --- Within Expectation       Orientation:      Raw Score Percentile   NAB Orientation, Form 1 29/29 65 Average       Estimated Intellectual Functioning:           Standard Score Percentile   Test of Premorbid Functioning (TOPF): 23 7 Well Below Average       Memory:          Wechsler Memory Scale (WMS-IV):                       Raw Score (Scaled Score) Percentile     Logical Memory I 22/50 (9) 37 Average    Logical Memory II 19/50 (9) 37 Average    Logical Memory Recognition 23/30 26-50 Average       [place] Verbal Learning Test (CVLT-III), Standard Form: Raw Score (Scaled/Standard Score) Percentile     Total Trials 1-5 46/80 32 Average    List B 6/16 (11) 63 Average    Short-Delay Free Recall 7/16 (7) 16 Below Average    Short-Delay  Cued Recall 9/16 (7) 16 Below Average    Long Delay Free Recall 7/16 (6) 9 Below Average    Long Delay Cued Recall 7/16 (6) 9 Below Average      Recognition Hits 14/16 (7) 16 Below Average      False Positive Errors 1 (10) 50 Average       Brief Visuospatial Memory Test (BVMT-R), Form 1: Raw Score (T Score) Percentile     Total Trials 1-3 25/36 (50) 50 Average    Delayed Recall 10/12 (53) 62 Average    Recognition Discrimination Index 6 >16 Within Normal Limits      Recognition Hits 6/6 >16 Within Normal Limits      False Positive Errors 0 >16 Within Normal Limits        Attention/Executive Function:          Trail Making Test (TMT): Raw Score (T Score) Percentile     Part A 24 secs., 0 errors (53) 62 Average    Part B 46 secs., 0 errors (61) 86 Above Average        Scaled Score Percentile   WAIS-IV Coding: 9 37 Average       NAB Attention Module, Form 1: T Score Percentile     Digits Forward 42 21 Below Average    Digits Backwards 52 58 Average       D-KEFS Color-Word Interference Test: Raw Score (Scaled Score) Percentile     Color Naming 24 secs. (12) 75 Above Average    Word Reading 22 secs. (10) 50 Average    Inhibition 48 secs. (12) 75 Above Average      Total Errors 1 error (10) 50 Average    Inhibition/Switching 57 secs. (11) 63 Average      Total Errors 0 errors (12) 75 Above Average       D-KEFS Verbal Fluency Test: Raw Score (Scaled Score)  Percentile     Letter Total Correct 38 (10) 50 Average    Category Total Correct 45 (12) 75 Above Average    Category Switching Total Correct 12 (8) 25 Average    Category Switching Accuracy 8 (6) 9 Below Average       D-KEFS 20 Questions Test: Scaled Score Percentile     Total Weighted Achievement Score 9 37 Average    Initial Abstraction Score 10 50 Average       [place] Card Sorting Test [facility]): Raw Score Percentile     Categories (trials) 3 (64) >16 Within Normal Limits    Total Errors 20 18 Below Average     Perseverative Errors 11 8 Well Below Average    Non-Perseverative Errors 9 18 Below Average    Failure to Maintain Set 1 --- ---       Language:          NAB Language Module, Form 1: T Score Percentile     Auditory Comprehension 53 62 Average    Naming 23 <1 Exceptionally Low       Visuospatial/Visuoconstruction:          NAB Spatial Module, Form 1: T Score Percentile     Figure Drawing Copy 45 31 Average    Figure Drawing Immediate Recall 61 86 Above Average        Scaled Score Percentile   WAIS-IV Matrix Reasoning: 9 37 Average  WAIS-IV Visual Puzzles: 8 25 Average       Mood and Personality:      Raw Score Percentile   Beck Depression Inventory - II: 20 --- Moderate  PROMIS Anxiety Questionnaire: 25 --- Moderate   Informed Consent and Coding/Compliance:   Donna Brandt. [place]en was provided with a verbal description of the nature and purpose of the present neuropsychological evaluation. Also reviewed were the foreseeable risks and/or discomforts and benefits of the procedure, limits of confidentiality, and mandatory reporting requirements of this provider. The patient was given the opportunity to ask questions and receive answers about the evaluation. Oral consent to participate was provided by the patient.   This evaluation was conducted by Christia Reading, Ph.D., licensed clinical neuropsychologist. Donna Brandt. Vancleave completed a 30-minute clinical interview, billed as one unit [phone], and 135 minutes of cognitive testing, billed as one unit [phone] and four additional units [phone]. Psychometrist Milana Kidney, B.S. assisted Dr. Melvyn Novas with test administration and scoring procedures. As a separate and discrete service, Dr. Melvyn Novas spent a total of 180 minutes in interpretation and report writing, billed as one unit 96132 and two units 96133.

## 2019-04-04 ENCOUNTER — Telehealth (HOSPITAL_COMMUNITY): Payer: Self-pay | Admitting: Emergency Medicine

## 2019-04-04 NOTE — Telephone Encounter (Signed)
Reaching out to patient to offer assistance regarding upcoming cardiac imaging study; pt verbalizes understanding of appt date/time, parking situation and where to check in, pre-test NPO status and medications ordered, and verified current allergies; name and call back number provided for further questions should they arise Donna Faulkenberry RN Navigator Cardiac Imaging [facility] [phone] office [phone] cell 

## 2019-04-07 ENCOUNTER — Ambulatory Visit (HOSPITAL_COMMUNITY): Admission: RE | Admit: 2019-04-07 | Payer: BC Managed Care – PPO | Source: Ambulatory Visit

## 2019-04-07 ENCOUNTER — Other Ambulatory Visit: Payer: Self-pay

## 2019-04-07 ENCOUNTER — Ambulatory Visit (HOSPITAL_COMMUNITY)
Admission: RE | Admit: 2019-04-07 | Discharge: 2019-04-07 | Disposition: A | Discharge status: Home / Self Care | Payer: BC Managed Care – PPO | Source: Ambulatory Visit | Attending: Cardiology | Admitting: Cardiology

## 2019-04-07 DIAGNOSIS — R072 Precordial pain: Secondary | ICD-10-CM | POA: Diagnosis not present

## 2019-04-07 MED ORDER — METOPROLOL TARTRATE 5 MG/5ML IV SOLN
5.0000 mg | INTRAVENOUS | Status: DC | PRN
  Administered 2019-04-07: 5 mg via INTRAVENOUS
  Filled 2019-04-07: qty 5

## 2019-04-07 MED ORDER — IOHEXOL 350 MG/ML SOLN
80.0000 mL | Freq: Once | INTRAVENOUS | Status: AC | PRN
  Administered 2019-04-07: 10:00:00 80 mL via INTRAVENOUS

## 2019-04-07 MED ORDER — NITROGLYCERIN 0.4 MG SL SUBL
0.8000 mg | SUBLINGUAL_TABLET | Freq: Once | SUBLINGUAL | Status: AC
  Administered 2019-04-07: 0.8 mg via SUBLINGUAL
  Filled 2019-04-07: qty 25

## 2019-04-07 MED ORDER — NITROGLYCERIN 0.4 MG SL SUBL
SUBLINGUAL_TABLET | SUBLINGUAL | Status: AC
  Filled 2019-04-07: qty 2

## 2019-04-07 MED ORDER — METOPROLOL TARTRATE 5 MG/5ML IV SOLN
INTRAVENOUS | Status: AC
  Filled 2019-04-07: qty 20

## 2019-04-07 NOTE — Progress Notes (Signed)
CT scan completed. Tolerated well. D/C home walking awake and alert. In no distress 

## 2019-04-08 ENCOUNTER — Encounter: Payer: Self-pay | Admitting: Psychology

## 2019-04-08 ENCOUNTER — Other Ambulatory Visit: Payer: Self-pay

## 2019-04-08 ENCOUNTER — Ambulatory Visit (INDEPENDENT_AMBULATORY_CARE_PROVIDER_SITE_OTHER): Payer: BC Managed Care – PPO | Admitting: Psychology

## 2019-04-08 DIAGNOSIS — F331 Major depressive disorder, recurrent, moderate: Secondary | ICD-10-CM

## 2019-04-08 DIAGNOSIS — F4322 Adjustment disorder with anxiety: Secondary | ICD-10-CM

## 2019-04-08 DIAGNOSIS — F339 Major depressive disorder, recurrent, unspecified: Secondary | ICD-10-CM | POA: Insufficient documentation

## 2019-04-08 HISTORY — DX: Adjustment disorder with anxiety: F43.22

## 2019-04-08 NOTE — Progress Notes (Signed)
CT scan of heart arteries show no blockages/calcium buildup. Chest pain not related to the heart. She may keep appt on 9/23 for BP follow up, if she wishes. If BP controlled, okay to cancel 9/23 follow up and keep as needed follow up.   Thanks MJP

## 2019-04-08 NOTE — Progress Notes (Signed)
   NEUROPSYCHOLOGICAL EVALUATION - [place]. [place] Department of Neurology  Reason for Referral:   Donna Brandt a 43 y.o. African-American female referred by Ellouise Newer, M.D.,to characterize hercurrent cognitive functioning and assist with diagnostic clarity and treatment planning in the context of subjective cognitive difficulties stemming from a history of colon cancer with subsequent radiation and oral chemotherapy treatment, as well as potential psychiatric comorbidities.  Feedback:   [name] completed a comprehensive neuropsychological evaluation on 04/01/2019. Briefly, results suggested neuropsychological functioning within normal limits. Subtle weaknesses were observed across unstructured tasks assessing verbal memory (i.e., retrieval) and executive functioning. However, these scores largely remained within normal limits. Factors which can create and maintain cognitive inefficiencies include a prior history of cancer-related radiation treatment and oral chemotherapy medication side effects, as well as significant psychiatric distress. Regarding the latter, responses across self-report measures suggested moderate levels of both anxiety and depression occurring over the previous 1-2 weeks. It is likely that a combination of these factors are responsible for day-to-day cognitive difficulties which [name] has been experiencing presently. Recommendations included medication and/or psychotherapy for treating ongoing psychiatric symptoms.  [name] was unaccompanied. Content of the current session focused on possible reasons for day-to-day difficulties which she has been experiencing. [name] was given the opportunity to ask questions and all her questions were answered. she was also encouraged to reach out should additional questions arise. A copy of her report was provided at the conclusion of the visit.      A total of 15 minutes were  spent with [name] during the current feedback session.

## 2019-04-08 NOTE — Progress Notes (Signed)
S/w pt she will cancel appt and call us if she needs [place].

## 2019-04-11 ENCOUNTER — Other Ambulatory Visit: Payer: BC Managed Care – PPO

## 2019-04-16 ENCOUNTER — Telehealth: Payer: BC Managed Care – PPO | Admitting: Cardiology

## 2019-04-21 ENCOUNTER — Other Ambulatory Visit: Payer: BC Managed Care – PPO

## 2019-04-30 ENCOUNTER — Telehealth: Payer: BC Managed Care – PPO | Admitting: Cardiology

## 2019-06-05 IMAGING — MR MR THORACIC SPINE WO/W CM
5 of 8 series · 26 of 48 positions shown · IV contrast (15 ml multihance)
Comparison: None.

CLINICAL DATA: Rectal cancer. Neuropathy and frequent falls. Hyper
reflexia.

EXAM:
MRI CERVICAL AND THORACIC SPINE WITHOUT AND WITH CONTRAST
TECHNIQUE: Multiplanar and multiecho pulse sequences of the cervical spine, to
include the craniocervical junction and cervicothoracic junction,
and thoracic spine, were obtained without and with intravenous
contrast.
CONTRAST:  15mL MULTIHANCE GADOBENATE DIMEGLUMINE 529 MG/ML IV SOLN

[Series 3: T1 · sagittal · 3.0mm · 0.41mm/px · 4 of 13 slices shown (1 of 2)]
[im 1/13]
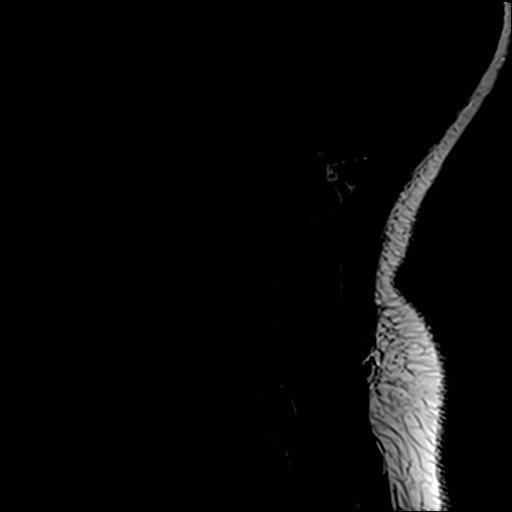
[im 5/13]
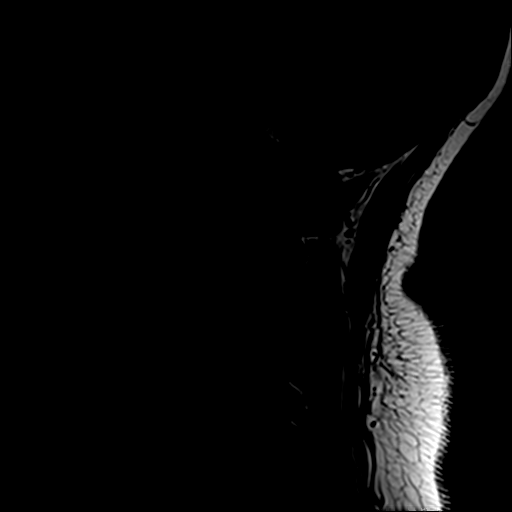
[im 9/13]
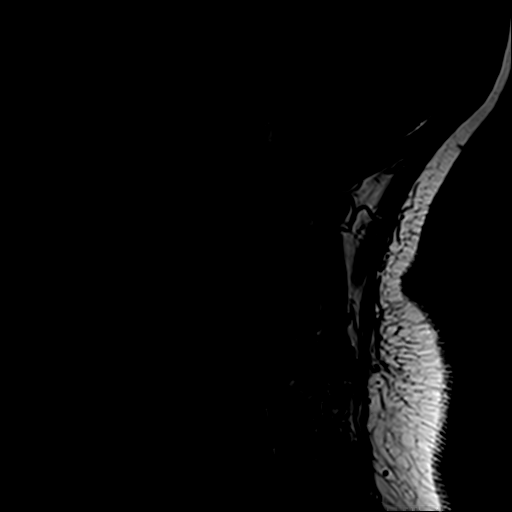
[im 13/13]
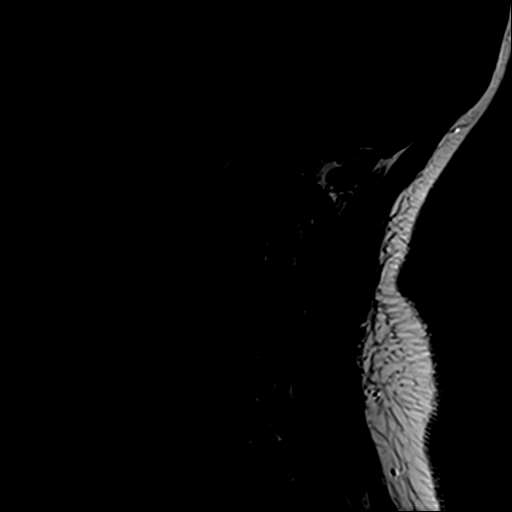

[Series 4: T2 · sagittal · 3.0mm · 0.41mm/px · 4 of 13 slices shown (1 of 2)]
[im 1/13]
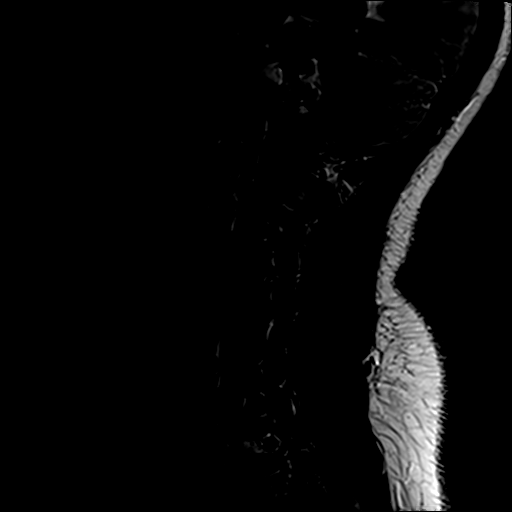
[im 5/13]
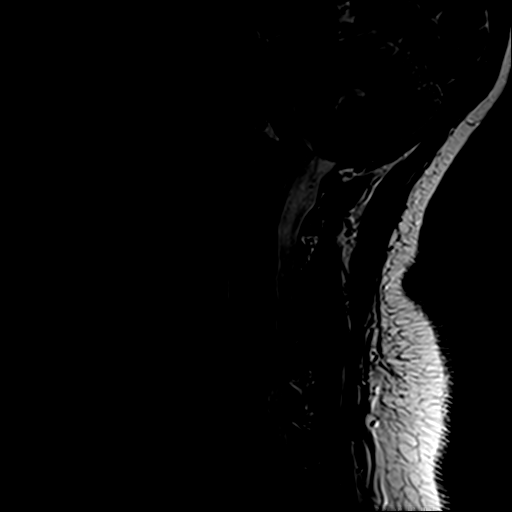
[im 9/13]
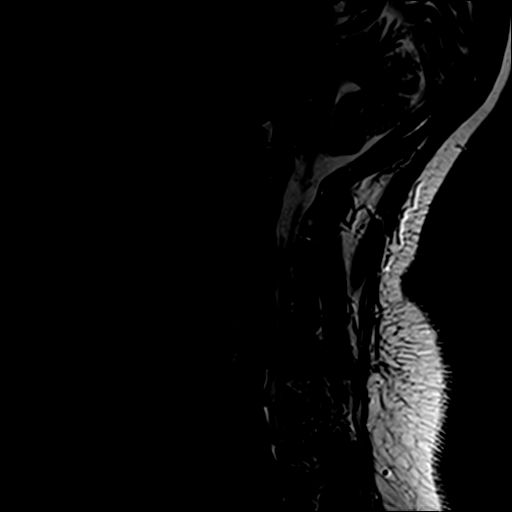
[im 13/13]
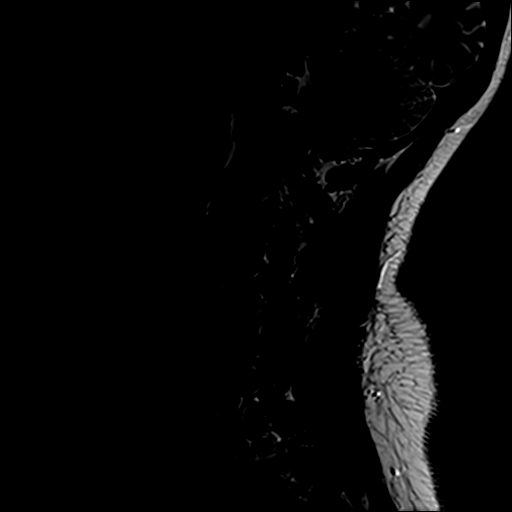

[Series 6: T2 · axial · 3.0mm · 0.70mm/px · z∈[-62,+31]mm · 8 of 26 slices shown (2 of 2)]
[im 1/26]
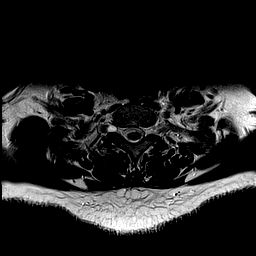
[im 4/26]
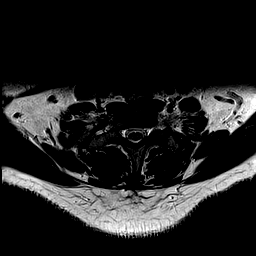
[im 8/26]
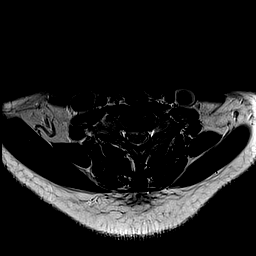
[im 11/26]
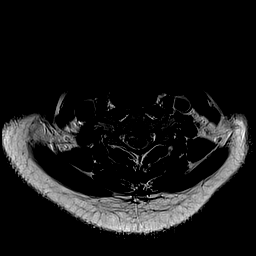
[im 15/26]
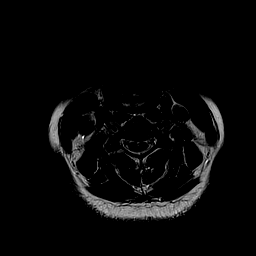
[im 18/26]
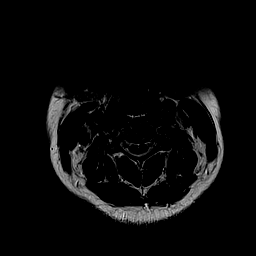
[im 22/26]
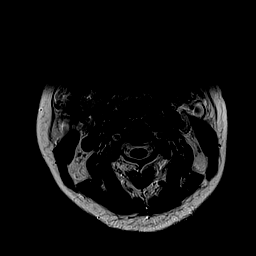
[im 26/26]
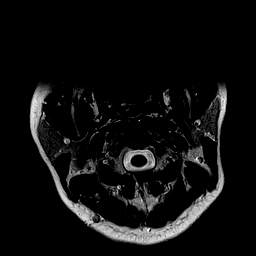

[Series 7: T1 · axial · 3.0mm · 0.35mm/px · z∈[-49,+46]mm · 8 of 26 slices shown (2 of 2)]
[im 1/26]
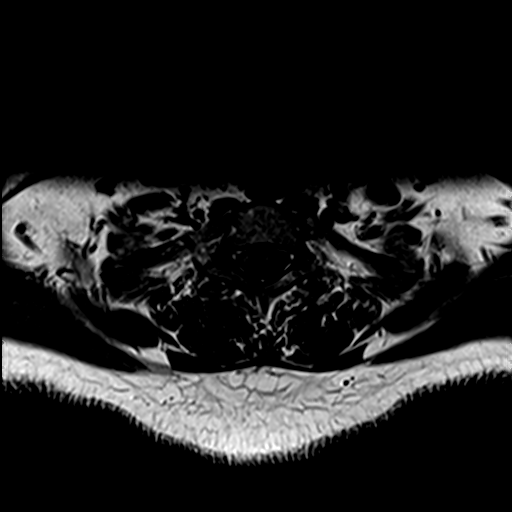
[im 4/26]
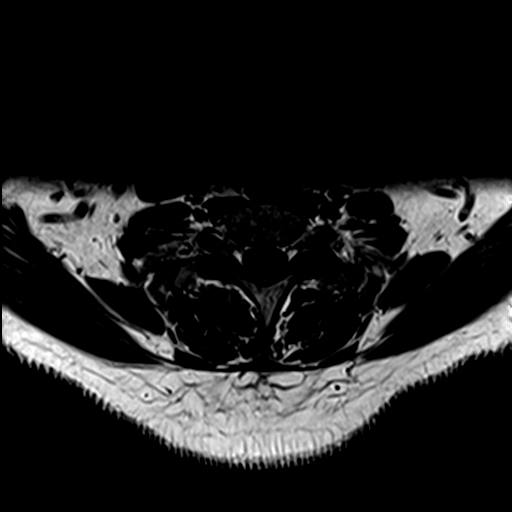
[im 8/26]
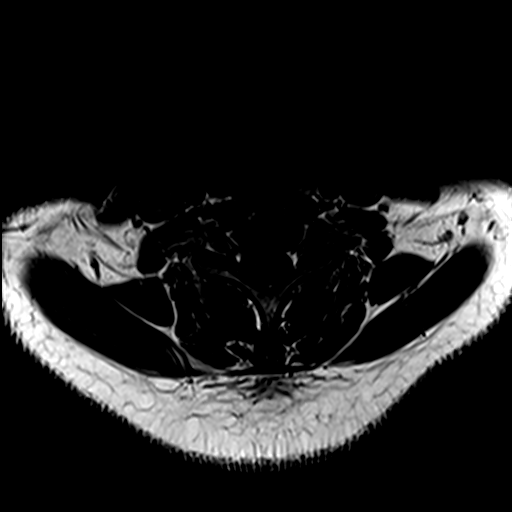
[im 11/26]
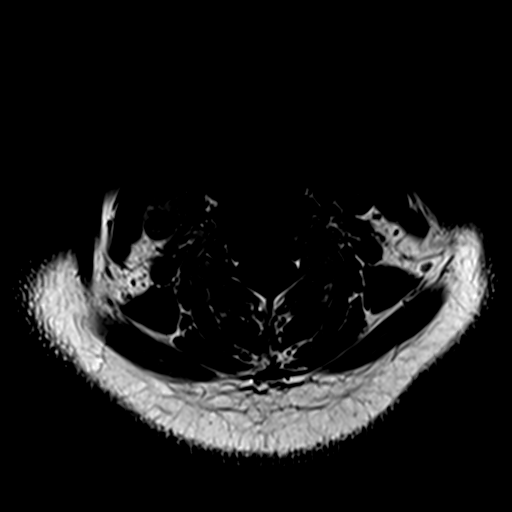
[im 15/26]
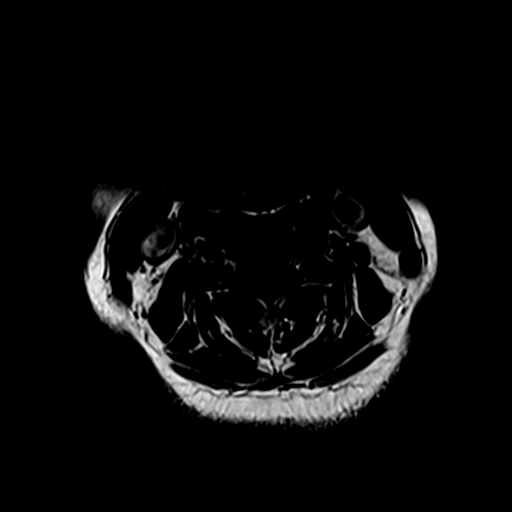
[im 18/26]
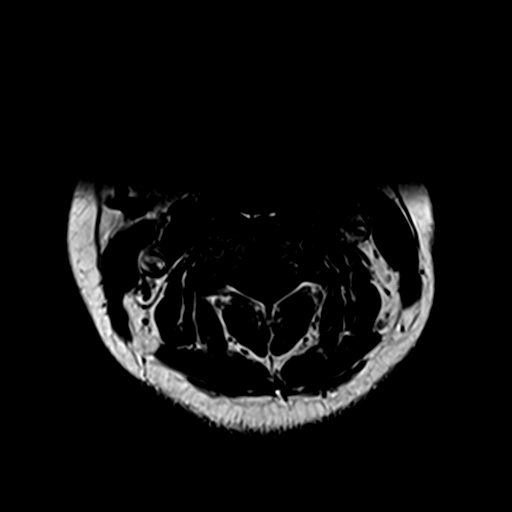
[im 22/26]
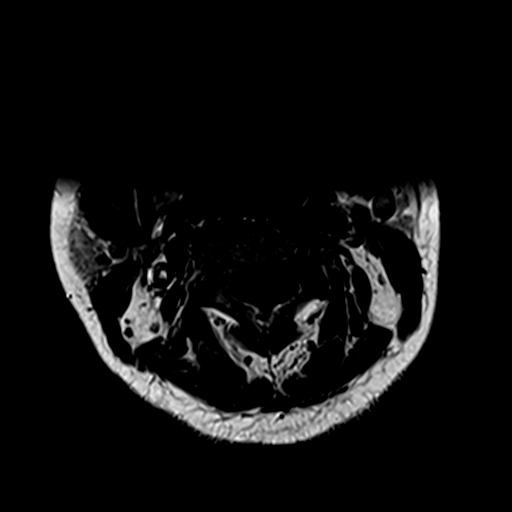
[im 26/26]
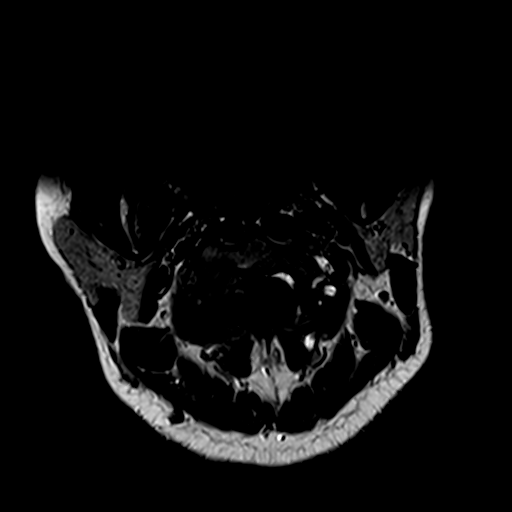

[Series 8: T1 fat-sat post-contrast · sagittal · 3.0mm · 0.82mm/px · 2 of 13 slices shown]
[im 1/13]
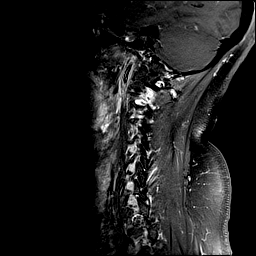
[im 5/13]
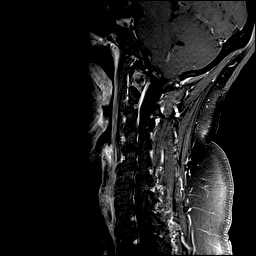

[26 of 48 positions shown; findings below may reference images not displayed]

FINDINGS: MRI CERVICAL SPINE FINDINGS

Alignment: Normal alignment. Straightening of the cervical lordosis
with mild kyphosis at C5-6

Vertebrae: Negative for fracture or mass. No evidence of metastatic
disease.

Cord: Normal cord signal and normal enhancement.

Posterior Fossa, vertebral arteries, paraspinal tissues: Negative

Disc levels:

Shallow central and left-sided disc protrusion at C5-6. Cord
flattening central and left-sided with mild spinal stenosis. No
other disc protrusion or significant stenosis.

MRI THORACIC SPINE FINDINGS

Alignment:  Normal

Vertebrae: Negative for fracture or mass. Normal enhancement. No
metastatic deposits identified.

Paraspinal and other soft tissues: No paraspinous soft tissue mass
or fluid collection. No pleural effusion.

Cord: Normal signal and morphology. Normal enhancement. Negative for
cord compression.

Disc levels:

Small central disc protrusion at T3-4 without stenosis. Mild disc
degeneration T6-7 and T7-8 without disc protrusion or stenosis.
IMPRESSION: 1. Central and left-sided disc protrusion at C5-6 with cord
flattening and mild spinal stenosis left greater than right. No
other significant cervical stenosis.
2. Mild thoracic disc degeneration without stenosis
3. Negative for metastatic disease to the cervical or thoracic
spine.

## 2019-06-24 IMAGING — XA Imaging study
1 series · 1 of 1 positions shown · IV contrast (isovue)
Comparison: None.

CLINICAL DATA: Post lumbar puncture headache.

EXAM:
LUMBAR EPIDUROGRAM AND BLOOD PATCH
FLUOROSCOPY TIME:  6 seconds corresponding to a Dose Area Product of
12.82 Gy*m2
TECHNIQUE: The skin entry site from previous lumbar puncture was localized
corresponding to the RIGHT-sided space between the L3 and L4 spinous
processes. An appropriate skin entry site was determined. Operator
donned sterile gloves and mask. Skin site was marked, prepped with
Betadine, and draped in usual sterile fashion, and infiltrated
locally with 1% lidocaine. The 20 gauge Crawford needle was advanced
into the lumbar posterior epidural space near the midline using a
right interlaminar approach at L3-D0using loss of resistance
technique. Diagnostic injection of 2mL Isovue-M 200 demonstrates
good epidural spread above and below the needle tip and crossing the
midline. No intravascular or subarachnoid component. 15 ml of
autologous blood were then administered as lumbar epidural blood
patch. No immediate complication.

[Series 1: ortho standard · 1 of 1 slices shown]
[im 1/1]
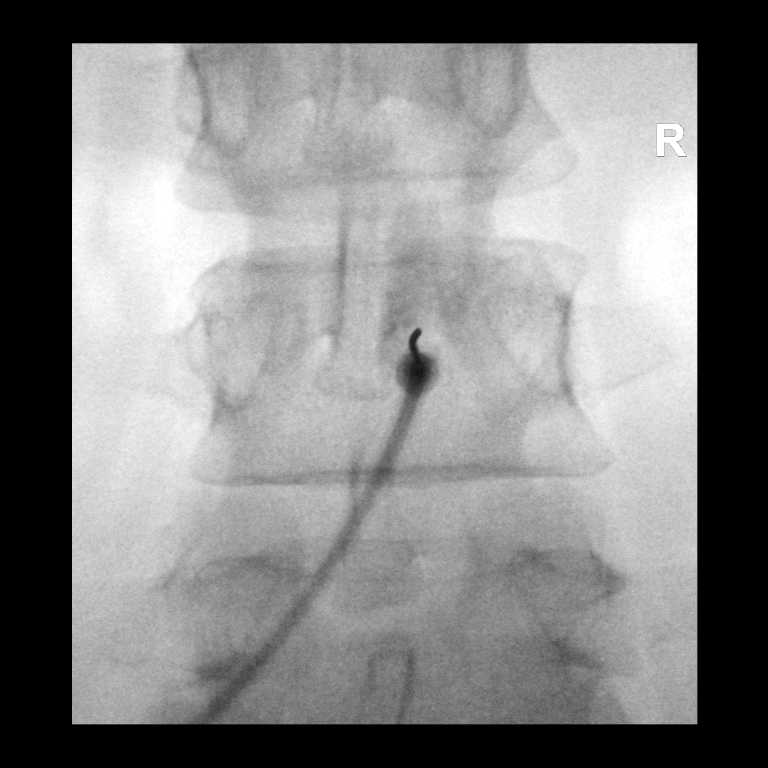

[1 of 1 positions shown; findings below may reference images not displayed]

IMPRESSION: 1. Technically successful lumbar epidural blood patch under
fluoroscopy. The patient was counseled on the importance of laying
flat for an additional 24 hours and maintaining good P.O. fluid
intake.

## 2019-08-18 ENCOUNTER — Other Ambulatory Visit: Payer: Self-pay

## 2019-08-18 ENCOUNTER — Inpatient Hospital Stay: Payer: Medicaid Other

## 2019-08-18 ENCOUNTER — Inpatient Hospital Stay: Payer: Medicaid Other | Attending: Oncology | Admitting: Oncology

## 2019-08-18 VITALS — BP 142/96 | HR 83 | Temp 97.3°F | Resp 16 | Ht 66.0 in | Wt 201.6 lb

## 2019-08-18 DIAGNOSIS — Z79899 Other long term (current) drug therapy: Secondary | ICD-10-CM | POA: Insufficient documentation

## 2019-08-18 DIAGNOSIS — Z23 Encounter for immunization: Secondary | ICD-10-CM | POA: Insufficient documentation

## 2019-08-18 DIAGNOSIS — I10 Essential (primary) hypertension: Secondary | ICD-10-CM | POA: Insufficient documentation

## 2019-08-18 DIAGNOSIS — R159 Full incontinence of feces: Secondary | ICD-10-CM | POA: Insufficient documentation

## 2019-08-18 DIAGNOSIS — L271 Localized skin eruption due to drugs and medicaments taken internally: Secondary | ICD-10-CM | POA: Diagnosis not present

## 2019-08-18 DIAGNOSIS — Z923 Personal history of irradiation: Secondary | ICD-10-CM | POA: Diagnosis not present

## 2019-08-18 DIAGNOSIS — T451X5A Adverse effect of antineoplastic and immunosuppressive drugs, initial encounter: Secondary | ICD-10-CM | POA: Diagnosis not present

## 2019-08-18 DIAGNOSIS — R531 Weakness: Secondary | ICD-10-CM | POA: Diagnosis not present

## 2019-08-18 DIAGNOSIS — G629 Polyneuropathy, unspecified: Secondary | ICD-10-CM | POA: Insufficient documentation

## 2019-08-18 DIAGNOSIS — F329 Major depressive disorder, single episode, unspecified: Secondary | ICD-10-CM | POA: Insufficient documentation

## 2019-08-18 DIAGNOSIS — C20 Malignant neoplasm of rectum: Secondary | ICD-10-CM

## 2019-08-18 MED ORDER — INFLUENZA VAC A&B SA ADJ QUAD 0.5 ML IM PRSY: 0.5000 mL | PREFILLED_SYRINGE | Freq: Once | INTRAMUSCULAR | Status: DC

## 2019-08-18 MED ORDER — INFLUENZA VAC SPLIT QUAD 0.5 ML IM SUSY
PREFILLED_SYRINGE | INTRAMUSCULAR | Status: AC
  Filled 2019-08-18: qty 0.5

## 2019-08-18 MED ORDER — INFLUENZA VAC SPLIT QUAD 0.5 ML IM SUSY
0.5000 mL | PREFILLED_SYRINGE | Freq: Once | INTRAMUSCULAR | Status: AC
  Administered 2019-08-18: 16:00:00 0.5 mL via INTRAMUSCULAR

## 2019-08-18 NOTE — Progress Notes (Signed)
[facility] OFFICE PROGRESS NOTE   Diagnosis: Rectal cancer  INTERVAL HISTORY:   Ms. Stelzner returns for a scheduled visit.  She reports a good appetite and energy level.  Neuropathy symptoms are much improved.  She no longer has numbness or tingling in the extremities.  She has occasional leg weakness.  Her chief complaint is irregular bowel habits.  She has frequency/urgency that is worse after ambulating.  She has episodes of fecal incontinence.  She is taking fiber Gummies.  Imodium helps slow the stool frequency.  She is not taking Imodium prophylactically.  Objective:  Vital signs in last 24 hours:  Blood pressure (!) 142/96, pulse 83, temperature (!) 97.3 F (36.3 C), temperature source Temporal, resp. rate 16, height '5\' 6"'$  (1.676 m), weight 201 lb 9.6 oz (91.4 kg), SpO2 100 %.    Limited physical examination secondary to distancing with the Covid pandemic Lymphatics: No cervical, supraclavicular, axillary, or inguinal nodes GI: No mass, nontender, no hepatosplenomegaly Vascular: No leg edema  Lab Results:  Lab Results  Component Value Date   WBC 4.4 02/12/2019   HGB 14.1 02/12/2019   HCT 42.3 02/12/2019   MCV 77.3 (L) 02/12/2019   PLT 215 02/12/2019   NEUTROABS 2.4 11/16/2017    CMP  Lab Results  Component Value Date   NA 139 02/12/2019   K 4.0 02/12/2019   CL 103 02/12/2019   CO2 24 02/12/2019   GLUCOSE 93 02/12/2019   BUN 8 02/12/2019   CREATININE 0.56 02/12/2019   CALCIUM 10.6 (H) 02/12/2019   PROT 7.7 04/10/2018   ALBUMIN 2.8 (L) 07/09/2018   AST 21 11/16/2017   ALT 23 11/16/2017   ALKPHOS 114 11/16/2017   BILITOT 0.4 11/16/2017   GFRNONAA >60 02/12/2019   GFRAA >60 02/12/2019    Lab Results  Component Value Date   CEA1 <1.00 02/14/2019    Medications: I have reviewed the patient's current medications.   Assessment/Plan: 1. Rectal cancer, clinical stage T3b,N0,M0  colonoscopy 03/06/2017-biopsy of a rectal mass revealed  fragments of an adenomatous lesion with high-grade dysplasia, definite submucosa not available to evaluate for invasion  Proctoscopy/biopsy 03/23/2017 confirmed a mass at 6-7 centimeters from the anal verge, biopsy revealed polypoid colorectal mucosa with detached fragments of adenomatous mucosa  Staging CTs 03/16/2017, anterior rectal mass, no evidence of metastatic disease, no adenopathy,  MRI 03/21/2017-T3b,N0rectal tumor  Initiationof radiation and concurrent Xeloda 04/23/2017.Completed 05/30/2017.  Laparoscopic low anterior resection and diverting ileostomy 07/26/2017,ypT2,ypN0tumor with negative surgical margins, normal mismatch repair protein expression  Cycle 1 adjuvant Xeloda 08/20/2017  Xeloda placed on hold beginning 08/28/2017 due to bilateral leg numbness  Xeloda discontinued  Flexible sigmoidoscopy 10/19/2017- patent end-to-end colocolonic anastomosis characterized by healthy-appearing mucosa. No specimens collected.  Surveillance colonoscopy 07/16/2018- distal rectum colo-colonic anastomosis normal.  Examination of the colon to the cecum otherwise normal.  No polyps or cancers.  Repeat colonoscopy in 3 years for surveillance.  2. Hypertension  3. 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 MRI lumbar spine 09/05/2017-no evidence of metastatic disease to the lumbosacral spine. No stenosis or neural compression. Fatty marrow changes from mid L5 through the sacrum presumably secondary to previous radiation.Leg strength improved on exam 09/06/2017. Per patient report numbness improved, still present over the soles of both feet.  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  Neuropathy improved.  6.Ileostomy reversal 11/28/2017    Disposition: Ms. Fazekas is in clinical remission from rectal cancer.  She has  irregular bowel habits and fecal incontinence.  She  does not wish to go back to the pelvic physical therapy clinic.  She plans to schedule an appointment with Dr. Dema Severin.  She will try Imodium prophylactically to see if this helps.  Ms. Utsey will return for an office visit and CEA in 6 months.  Betsy Coder, MD  08/18/2019  4:38 PM

## 2019-08-19 ENCOUNTER — Telehealth: Payer: Self-pay | Admitting: Oncology

## 2019-08-19 LAB — CEA (IN HOUSE-CHCC): CEA (CHCC-In House): <1 ng/mL (ref 0.00–5.00)

## 2019-08-19 NOTE — Telephone Encounter (Signed)
Scheduled per los. Called and left msg. Mailed printout  °

## 2019-08-20 ENCOUNTER — Telehealth: Payer: Self-pay | Admitting: *Deleted

## 2019-08-20 NOTE — Telephone Encounter (Signed)
Notified of normal CEA results. 

## 2019-08-20 NOTE — Telephone Encounter (Signed)
-----   Message from Ladell Pier, MD sent at 08/19/2019  4:08 PM EST ----- Please call patient, CEA is normal

## 2019-09-03 ENCOUNTER — Ambulatory Visit: Payer: Medicaid Other | Attending: Internal Medicine

## 2019-09-03 DIAGNOSIS — Z20822 Contact with and (suspected) exposure to covid-19: Secondary | ICD-10-CM

## 2019-09-05 LAB — NOVEL CORONAVIRUS, NAA: SARS-CoV-2, NAA: NOT DETECTED

## 2019-09-10 ENCOUNTER — Ambulatory Visit: Payer: BC Managed Care – PPO | Admitting: Neurology

## 2019-09-17 ENCOUNTER — Telehealth: Payer: Self-pay

## 2019-09-17 DIAGNOSIS — Z1159 Encounter for screening for other viral diseases: Secondary | ICD-10-CM

## 2019-09-17 DIAGNOSIS — C20 Malignant neoplasm of rectum: Secondary | ICD-10-CM | POA: Diagnosis not present

## 2019-09-17 NOTE — Telephone Encounter (Signed)
-----   Message from Milus Banister, MD sent at 09/17/2019 12:26 PM EST ----- Regarding: RE: Flex sig She is not due for full surveillance colonoscopy until the end of 2022 (3 year interval) and so I think its probably safe to go ahead with just a flex sig. I'm happy to do that in our [place].  If you can let her know about the plan, we'll contact her to arrange.  Thanks  Adrienne Mocha, Can you reach out to her tomorrow or Friday (allowing Dr. Dema Severin some time to let her know the plan) about flex sig [facility], change in bowels, minor bleeding, history of rectal cancer.  Thanks  DJ  ----- Message ----- From: Ileana Roup, MD Sent: 09/17/2019  12:13 PM EST To: Milus Banister, MD Subject: Flex sig                                       Melissa Montane -  Ms. Hollomon is a mutual patient, super nice 44yo - hx of rectal cancer with chemoXRT, LAR, stapled colo-distal rectal anastomosis 07/2017. She had a scope with you prior to loop ileostomy closure that looked good. She has been somewhat lost to follow-up with me since we closed her ileostomy but following with Brad. 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 I'm working on improving that now. That said, she has had some unusual issues with BRBPR and pelvic pain that "feels like" when she had rectal cancer. Habitus limits a great distal rectal exam digitally - I can feel the posterior half of the anastomosis that felt normal. Given her hx and symptoms, would you be willing to repeat a flex sig on her (or colon if you feel necessary); I had done a flex sig on her in the past but [facility] facility fees seem to be a lot higher than [facility]!  I've also ordered her surveillance CT scans which she is due for  [facility]

## 2019-09-18 NOTE — Telephone Encounter (Signed)
Left message on machine to call back  

## 2019-09-19 NOTE — Telephone Encounter (Signed)
The pt as been scheduled for flex, previsit and COVID testing.  The pt is aware and all questions answered the pt verbalized understanding.

## 2019-09-27 ENCOUNTER — Other Ambulatory Visit: Payer: Self-pay | Admitting: Family Medicine

## 2019-09-27 DIAGNOSIS — C20 Malignant neoplasm of rectum: Secondary | ICD-10-CM

## 2019-09-30 ENCOUNTER — Other Ambulatory Visit: Payer: Self-pay

## 2019-09-30 ENCOUNTER — Ambulatory Visit (AMBULATORY_SURGERY_CENTER): Payer: Self-pay | Admitting: *Deleted

## 2019-09-30 VITALS — Temp 97.6°F | Ht 66.0 in | Wt 201.0 lb

## 2019-09-30 DIAGNOSIS — Z85048 Personal history of other malignant neoplasm of rectum, rectosigmoid junction, and anus: Secondary | ICD-10-CM

## 2019-09-30 DIAGNOSIS — Z01818 Encounter for other preprocedural examination: Secondary | ICD-10-CM

## 2019-09-30 NOTE — Progress Notes (Signed)
Patient is here in-person for PV. Patient denies any allergies to eggs or soy. Patient denies any problems with anesthesia/sedation. Patient denies any oxygen use at home. Patient denies taking any diet/weight loss medications or blood thinners. Patient is not being treated for MRSA or C-diff.   COVID-19 screening test is on 2/25, the pt is aware. Pt is aware that care partner will wait in the car during procedure; if they feel like they will be too hot or cold to wait in the car; they may wait in the 4 th floor lobby. Patient is aware to bring only one care partner. We want them to wear a mask (we do not have any that we can provide them), practice social distancing, and we will check their temperatures when they get here.  I did remind the patient that their care partner needs to stay in the parking lot the entire time and have a cell phone available, we will call them when the pt is ready for discharge. Patient will wear mask into building.

## 2019-10-01 ENCOUNTER — Other Ambulatory Visit: Payer: Self-pay | Admitting: Surgery

## 2019-10-01 DIAGNOSIS — C20 Malignant neoplasm of rectum: Secondary | ICD-10-CM

## 2019-10-02 ENCOUNTER — Ambulatory Visit (INDEPENDENT_AMBULATORY_CARE_PROVIDER_SITE_OTHER): Payer: Medicaid Other

## 2019-10-02 DIAGNOSIS — Z1159 Encounter for screening for other viral diseases: Secondary | ICD-10-CM

## 2019-10-03 LAB — SARS CORONAVIRUS 2 (TAT 6-24 HRS): SARS Coronavirus 2: NEGATIVE

## 2019-10-06 ENCOUNTER — Ambulatory Visit (AMBULATORY_SURGERY_CENTER): Payer: Medicaid Other | Admitting: Gastroenterology

## 2019-10-06 ENCOUNTER — Encounter: Payer: Self-pay | Admitting: Gastroenterology

## 2019-10-06 ENCOUNTER — Other Ambulatory Visit: Payer: Self-pay

## 2019-10-06 VITALS — BP 132/81 | HR 67 | Temp 97.8°F | Resp 18 | Ht 66.0 in | Wt 201.0 lb

## 2019-10-06 DIAGNOSIS — Z85048 Personal history of other malignant neoplasm of rectum, rectosigmoid junction, and anus: Secondary | ICD-10-CM

## 2019-10-06 MED ORDER — SODIUM CHLORIDE 0.9 % IV SOLN: 500.0000 mL | Freq: Once | INTRAVENOUS | Status: DC

## 2019-10-06 MED ORDER — LOPERAMIDE HCL 2 MG PO TABS: 2.0000 mg | ORAL_TABLET | Freq: Every day | ORAL | 0 refills | Status: DC

## 2019-10-06 NOTE — Progress Notes (Signed)
PT taken to PACU. Monitors in place. VSS. Report given to RN. 

## 2019-10-06 NOTE — Progress Notes (Signed)
Pt's states no medical or surgical changes since previsit or office visit. 

## 2019-10-06 NOTE — Patient Instructions (Signed)
Start using one imodium daily in the morning for the next 2-3 weeks then notify Dr. Ardis Hughs with response of medication.   YOU HAD AN ENDOSCOPIC PROCEDURE TODAY AT [facility]:   Refer to the procedure report that was given to you for any specific questions about what was found during the examination.  If the procedure report does not answer your questions, please call your gastroenterologist to clarify.  If you requested that your care partner not be given the details of your procedure findings, then the procedure report has been included in a sealed envelope for you to review at your convenience later.  YOU SHOULD EXPECT: Some feelings of bloating in the abdomen. Passage of more gas than usual.  Walking can help get rid of the air that was put into your GI tract during the procedure and reduce the bloating. If you had a lower endoscopy (such as a colonoscopy or flexible sigmoidoscopy) you may notice spotting of blood in your stool or on the toilet paper. If you underwent a bowel prep for your procedure, you may not have a normal bowel movement for a few days.  Please Note:  You might notice some irritation and congestion in your nose or some drainage.  This is from the oxygen used during your procedure.  There is no need for concern and it should clear up in a day or so.  SYMPTOMS TO REPORT IMMEDIATELY:   Following lower endoscopy (colonoscopy or flexible sigmoidoscopy):  Excessive amounts of blood in the stool  Significant tenderness or worsening of abdominal pains  Swelling of the abdomen that is new, acute  Fever of 100F or higher  For urgent or emergent issues, a gastroenterologist can be reached at any hour by calling [phone].   DIET:  We do recommend a small meal at first, but then you may proceed to your regular diet.  Drink plenty of fluids but you should avoid alcoholic beverages for 24 hours.  ACTIVITY:  You should plan to take it easy for the rest of today  and you should NOT DRIVE or use heavy machinery until tomorrow (because of the sedation medicines used during the test).    FOLLOW UP: Our staff will call the number listed on your records 48-72 hours following your procedure to check on you and address any questions or concerns that you may have regarding the information given to you following your procedure. If we do not reach you, we will leave a message.  We will attempt to reach you two times.  During this call, we will ask if you have developed any symptoms of COVID 19. If you develop any symptoms (ie: fever, flu-like symptoms, shortness of breath, cough etc.) before then, please call [phone].  If you test positive for Covid 19 in the 2 weeks post procedure, please call and report this information to [place].    If any biopsies were taken you will be contacted by phone or by letter within the next 1-3 weeks.  Please call us at [phone] if you have not heard about the biopsies in 3 weeks.    SIGNATURES/CONFIDENTIALITY: You and/or your care partner have signed paperwork which will be entered into your electronic medical record.  These signatures attest to the fact that that the information above on your After Visit Summary has been reviewed and is understood.  Full responsibility of the confidentiality of this discharge information lies with you and/or your care-partner.

## 2019-10-06 NOTE — Op Note (Signed)
[facility] Patient Name: Donna Brandt Procedure Date: 10/06/2019 3:16 PM MRN: [id number] Endoscopist: Milus Banister , MD Age: 44 Referring MD:  Date of Birth: 11-Oct-1975 Gender: Female Account #: [id number] Procedure:                Flexible Sigmoidoscopy Indications:              Hematochezia; Rectal Adenocarcinoma diagnosed 2018                            in [place], underwent neoadjuvant chemo/XRT and                            then LAR with ileostomy 2018, eventual ileostomy                            takedown. Colonoscopy 07/2018 Dr. Ardis Hughs normal                            examination, normal colo-colonic anastomosis about                            2cm from anal verge. Medicines:                Monitored Anesthesia Care Procedure:                Pre-Anesthesia Assessment:                           - Prior to the procedure, a History and Physical                            was performed, and patient medications and                            allergies were reviewed. The patient's tolerance of                            previous anesthesia was also reviewed. The risks                            and benefits of the procedure and the sedation                            options and risks were discussed with the patient.                            All questions were answered, and informed consent                            was obtained. Prior Anticoagulants: The patient has                            taken no previous anticoagulant or antiplatelet  agents. ASA Grade Assessment: II - A patient with                            mild systemic disease. After reviewing the risks                            and benefits, the patient was deemed in                            satisfactory condition to undergo the procedure.                           After obtaining informed consent, the scope was                            passed under direct vision.  The Colonoscope was                            introduced through the anus and advanced to the the                            splenic flexure. The flexible sigmoidoscopy was                            accomplished without difficulty. The patient                            tolerated the procedure well. The quality of the                            bowel preparation was good. Scope In: Scope Out: Findings:                 Low but otherwise normal appearing colo-colonic                            anastomosis (1-2cm from the anal verge).                           Very small 'rectal' vault precluded safe retroflex                            view of the anus.                           Examination to the splenic flexure was otherwise                            normal. Complications:            No immediate complications. Estimated blood loss:                            None. Estimated Blood Loss:     Estimated blood loss: none. Impression:  Low but otherwise normal appearing colo-colonic                            anastomosis (1-2cm from the anal verge).                           Very small 'rectal' vault precluded safe retroflex                            view of the anus.                           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 She will call my office in 2-3 weeks to                            report on her response. Recommendation:           - Discharge patient to home. Milus Banister, MD 10/06/2019 4:08:00 PM This report has been signed electronically.

## 2019-10-07 ENCOUNTER — Ambulatory Visit
Admission: RE | Admit: 2019-10-07 | Discharge: 2019-10-07 | Disposition: A | Discharge status: Home / Self Care | Payer: Medicaid Other | Source: Ambulatory Visit | Attending: Family Medicine | Admitting: Family Medicine

## 2019-10-07 DIAGNOSIS — C20 Malignant neoplasm of rectum: Secondary | ICD-10-CM

## 2019-10-07 MED ORDER — IOPAMIDOL (ISOVUE-300) INJECTION 61%
100.0000 mL | Freq: Once | INTRAVENOUS | Status: AC | PRN
  Administered 2019-10-07: 100 mL via INTRAVENOUS

## 2019-10-08 ENCOUNTER — Telehealth: Payer: Self-pay | Admitting: *Deleted

## 2019-10-08 NOTE — Telephone Encounter (Signed)
Left message

## 2019-10-08 NOTE — Telephone Encounter (Signed)
No answer for post procedure call back. Left message for patient and will call back later this afternoon.

## 2019-10-25 DIAGNOSIS — H40033 Anatomical narrow angle, bilateral: Secondary | ICD-10-CM | POA: Diagnosis not present

## 2019-10-25 DIAGNOSIS — G44209 Tension-type headache, unspecified, not intractable: Secondary | ICD-10-CM | POA: Diagnosis not present

## 2019-11-12 DIAGNOSIS — C20 Malignant neoplasm of rectum: Secondary | ICD-10-CM | POA: Diagnosis not present

## 2019-12-16 DIAGNOSIS — M791 Myalgia, unspecified site: Secondary | ICD-10-CM | POA: Diagnosis not present

## 2019-12-16 DIAGNOSIS — R519 Headache, unspecified: Secondary | ICD-10-CM | POA: Diagnosis not present

## 2019-12-16 DIAGNOSIS — R5383 Other fatigue: Secondary | ICD-10-CM | POA: Diagnosis not present

## 2020-01-10 DIAGNOSIS — H1013 Acute atopic conjunctivitis, bilateral: Secondary | ICD-10-CM | POA: Diagnosis not present

## 2020-01-31 DIAGNOSIS — H04123 Dry eye syndrome of bilateral lacrimal glands: Secondary | ICD-10-CM | POA: Diagnosis not present

## 2020-02-16 ENCOUNTER — Inpatient Hospital Stay: Payer: Medicaid Other | Attending: Nurse Practitioner | Admitting: Nurse Practitioner

## 2020-02-16 ENCOUNTER — Other Ambulatory Visit: Payer: Self-pay

## 2020-02-16 ENCOUNTER — Encounter: Payer: Self-pay | Admitting: Nurse Practitioner

## 2020-02-16 ENCOUNTER — Inpatient Hospital Stay: Payer: Medicaid Other

## 2020-02-16 VITALS — BP 170/82 | HR 72 | Temp 97.5°F | Resp 18 | Ht 66.0 in | Wt 201.6 lb

## 2020-02-16 DIAGNOSIS — R531 Weakness: Secondary | ICD-10-CM | POA: Diagnosis not present

## 2020-02-16 DIAGNOSIS — G629 Polyneuropathy, unspecified: Secondary | ICD-10-CM | POA: Diagnosis not present

## 2020-02-16 DIAGNOSIS — C20 Malignant neoplasm of rectum: Secondary | ICD-10-CM

## 2020-02-16 DIAGNOSIS — R519 Headache, unspecified: Secondary | ICD-10-CM | POA: Insufficient documentation

## 2020-02-16 DIAGNOSIS — Z923 Personal history of irradiation: Secondary | ICD-10-CM | POA: Insufficient documentation

## 2020-02-16 DIAGNOSIS — Z79899 Other long term (current) drug therapy: Secondary | ICD-10-CM | POA: Insufficient documentation

## 2020-02-16 DIAGNOSIS — I10 Essential (primary) hypertension: Secondary | ICD-10-CM | POA: Insufficient documentation

## 2020-02-16 DIAGNOSIS — F329 Major depressive disorder, single episode, unspecified: Secondary | ICD-10-CM | POA: Insufficient documentation

## 2020-02-16 LAB — CEA (IN HOUSE-CHCC): CEA (CHCC-In House): <1 ng/mL (ref 0.00–5.00)

## 2020-02-16 NOTE — Progress Notes (Signed)
  [facility] OFFICE PROGRESS NOTE   Diagnosis: Rectal cancer  INTERVAL HISTORY:   Donna Brandt returns as scheduled.  Bowel habits continue to be irregular/erratic.  She continues to note blood intermittently with bowel movements.  No abdominal pain.  Neuropathy symptoms continue to be improved.  She reports her blood pressure is currently elevated due to work/stress.  At present she has a mild headache.  No visual disturbance.  No shortness of breath or chest pain.  Objective:  Vital signs in last 24 hours:  Blood pressure (!) 170/82, pulse 72, temperature (!) 97.5 F (36.4 C), temperature source Temporal, resp. rate 18, height '5\' 6"'$  (1.676 m), weight 201 lb 9.6 oz (91.4 kg), SpO2 100 %.    Lymphatics: No palpable cervical, supraclavicular, axillary or inguinal lymph nodes. Resp: Lungs clear bilaterally. Cardio: Regular rate and rhythm. GI: Abdomen soft and nontender.  No hepatomegaly. Vascular: No leg edema.    Lab Results:  Lab Results  Component Value Date   WBC 4.4 02/12/2019   HGB 14.1 02/12/2019   HCT 42.3 02/12/2019   MCV 77.3 (L) 02/12/2019   PLT 215 02/12/2019   NEUTROABS 2.4 11/16/2017    Imaging:  No results found.  Medications: I have reviewed the patient's current medications.  Assessment/Plan: 1. Rectal cancer, clinical stage T3b,N0,M0  colonoscopy 03/06/2017-biopsy of a rectal mass revealed fragments of an adenomatous lesion with high-grade dysplasia, definite submucosa not available to evaluate for invasion  Proctoscopy/biopsy 03/23/2017 confirmed a mass at 6-7 centimeters from the anal verge, biopsy revealed polypoid colorectal mucosa with detached fragments of adenomatous mucosa  Staging CTs 03/16/2017, anterior rectal mass, no evidence of metastatic disease, no adenopathy,  MRI 03/21/2017-T3b,N0rectal tumor  Initiationof radiation and concurrent Xeloda 04/23/2017.Completed 05/30/2017.  Laparoscopic low anterior resection  and diverting ileostomy 07/26/2017,ypT2,ypN0tumor with negative surgical margins, normal mismatch repair protein expression  Cycle 1 adjuvant Xeloda 08/20/2017  Xeloda placed on hold beginning 08/28/2017 due to bilateral leg numbness  Xeloda discontinued  Flexible sigmoidoscopy 10/19/2017-patent end-to-end colocolonic anastomosis characterized by healthy-appearing mucosa. No specimens collected.  Surveillance colonoscopy 07/16/2018- distal rectum colo-colonic anastomosis normal.  Examination of the colon to the cecum otherwise normal.  No polyps or cancers.  Repeat colonoscopy in 3 years for surveillance.  2. Hypertension  3. 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 MRI lumbar spine 09/05/2017-no evidence of metastatic disease to the lumbosacral spine. No stenosis or neural compression. Fatty marrow changes from mid L5 through the sacrum presumably secondary to previous radiation.Leg strength improved on exam 09/06/2017. Per patient report numbness improved, still present over the soles of both feet.Evaluated by neurology 04/10/2018-diagnosed with neuropathy, started on Lyrica.  Neuropathy improved.  6.Ileostomy reversal 11/28/2017  Disposition: Donna Brandt remains in clinical remission from rectal cancer.  We will follow-up on the CEA from today.  We discussed pelvic physical therapy for the continued irregular/erratic bowel habits.  She agrees, referral made.  Blood pressure elevated in the office today.  She is on 3 blood pressure medications.  Blood pressure was better when checked manually.  She does not have a PCP.  We made an urgent referral to establish with a primary care provider for blood pressure management.  She will return for a CEA and follow-up visit in 6 months.  Plan reviewed with Dr. Benay Spice.    Ned Card ANP/GNP-BC   02/16/2020  4:48 PM

## 2020-02-16 NOTE — Progress Notes (Signed)
Placed referral for Pelvic Floor Rehab in Epic, specified [facility] and sent Earlie Counts PT a message as well.

## 2020-02-18 ENCOUNTER — Ambulatory Visit: Payer: Medicaid Other | Admitting: Physical Therapy

## 2020-02-18 ENCOUNTER — Telehealth: Payer: Self-pay

## 2020-02-18 NOTE — Telephone Encounter (Signed)
TC to pt per Ned Card NP to let her know that her CEA is stable in normal range. Follow-up as scheduled.  Pt verbalized understanding.

## 2020-03-29 ENCOUNTER — Ambulatory Visit: Payer: Medicaid Other | Attending: Physical Therapy | Admitting: Physical Therapy

## 2020-04-05 ENCOUNTER — Ambulatory Visit: Payer: Medicaid Other | Admitting: Physical Therapy

## 2020-04-16 ENCOUNTER — Ambulatory Visit: Payer: Medicaid Other | Admitting: Physical Therapy

## 2020-04-23 ENCOUNTER — Encounter: Payer: Medicaid Other | Admitting: Physical Therapy

## 2020-06-13 DIAGNOSIS — H5213 Myopia, bilateral: Secondary | ICD-10-CM | POA: Diagnosis not present

## 2020-08-11 ENCOUNTER — Telehealth: Payer: Self-pay | Admitting: *Deleted

## 2020-08-11 NOTE — Telephone Encounter (Signed)
Patient concerned she may have the flu or COVID. Asking where to get tested? Provided the phone # for [facility] testing site to make appointment. Informed her she is far enough out from her cancer that this will not impact her risk of complications.

## 2020-08-17 ENCOUNTER — Ambulatory Visit: Payer: Medicaid Other | Admitting: Oncology

## 2020-08-17 ENCOUNTER — Other Ambulatory Visit: Payer: Medicaid Other

## 2020-08-19 ENCOUNTER — Inpatient Hospital Stay: Payer: Medicaid Other | Admitting: Oncology

## 2020-08-19 ENCOUNTER — Inpatient Hospital Stay: Payer: Medicaid Other | Attending: Oncology

## 2020-08-19 ENCOUNTER — Encounter: Payer: Self-pay | Admitting: *Deleted

## 2020-08-19 NOTE — Progress Notes (Signed)
No show today for f/u. Scheduling message sent to reschedule for mid-Feb.

## 2020-08-20 ENCOUNTER — Telehealth: Payer: Self-pay | Admitting: Oncology

## 2020-08-20 NOTE — Telephone Encounter (Signed)
Rescheduled appointment per 1/13 schedule message. Patient is aware of changes.

## 2020-09-11 DIAGNOSIS — L918 Other hypertrophic disorders of the skin: Secondary | ICD-10-CM | POA: Diagnosis not present

## 2020-09-11 DIAGNOSIS — Z131 Encounter for screening for diabetes mellitus: Secondary | ICD-10-CM | POA: Diagnosis not present

## 2020-09-11 DIAGNOSIS — Z13220 Encounter for screening for lipoid disorders: Secondary | ICD-10-CM | POA: Diagnosis not present

## 2020-09-11 DIAGNOSIS — Z20822 Contact with and (suspected) exposure to covid-19: Secondary | ICD-10-CM | POA: Diagnosis not present

## 2020-09-11 DIAGNOSIS — Z1159 Encounter for screening for other viral diseases: Secondary | ICD-10-CM | POA: Diagnosis not present

## 2020-09-11 DIAGNOSIS — Z85048 Personal history of other malignant neoplasm of rectum, rectosigmoid junction, and anus: Secondary | ICD-10-CM | POA: Diagnosis not present

## 2020-09-11 DIAGNOSIS — Z114 Encounter for screening for human immunodeficiency virus [HIV]: Secondary | ICD-10-CM | POA: Diagnosis not present

## 2020-09-11 DIAGNOSIS — Z0000 Encounter for general adult medical examination without abnormal findings: Secondary | ICD-10-CM | POA: Diagnosis not present

## 2020-09-11 DIAGNOSIS — I10 Essential (primary) hypertension: Secondary | ICD-10-CM | POA: Diagnosis not present

## 2020-09-13 DIAGNOSIS — D229 Melanocytic nevi, unspecified: Secondary | ICD-10-CM | POA: Diagnosis not present

## 2020-09-13 DIAGNOSIS — Z01419 Encounter for gynecological examination (general) (routine) without abnormal findings: Secondary | ICD-10-CM | POA: Diagnosis not present

## 2020-09-13 DIAGNOSIS — Z90710 Acquired absence of both cervix and uterus: Secondary | ICD-10-CM | POA: Diagnosis not present

## 2020-09-13 DIAGNOSIS — I10 Essential (primary) hypertension: Secondary | ICD-10-CM | POA: Diagnosis not present

## 2020-09-13 DIAGNOSIS — E119 Type 2 diabetes mellitus without complications: Secondary | ICD-10-CM | POA: Diagnosis not present

## 2020-09-23 ENCOUNTER — Inpatient Hospital Stay (HOSPITAL_BASED_OUTPATIENT_CLINIC_OR_DEPARTMENT_OTHER): Payer: Medicaid Other | Admitting: Oncology

## 2020-09-23 ENCOUNTER — Other Ambulatory Visit: Payer: Self-pay

## 2020-09-23 ENCOUNTER — Inpatient Hospital Stay: Payer: Medicaid Other | Attending: Oncology

## 2020-09-23 ENCOUNTER — Telehealth: Payer: Self-pay | Admitting: Oncology

## 2020-09-23 VITALS — BP 119/79 | HR 85 | Temp 97.8°F | Resp 17 | Ht 66.0 in | Wt 200.0 lb

## 2020-09-23 DIAGNOSIS — R531 Weakness: Secondary | ICD-10-CM | POA: Diagnosis not present

## 2020-09-23 DIAGNOSIS — Z79899 Other long term (current) drug therapy: Secondary | ICD-10-CM | POA: Diagnosis not present

## 2020-09-23 DIAGNOSIS — T451X5A Adverse effect of antineoplastic and immunosuppressive drugs, initial encounter: Secondary | ICD-10-CM | POA: Insufficient documentation

## 2020-09-23 DIAGNOSIS — Z923 Personal history of irradiation: Secondary | ICD-10-CM | POA: Insufficient documentation

## 2020-09-23 DIAGNOSIS — Z8616 Personal history of COVID-19: Secondary | ICD-10-CM | POA: Insufficient documentation

## 2020-09-23 DIAGNOSIS — L271 Localized skin eruption due to drugs and medicaments taken internally: Secondary | ICD-10-CM | POA: Insufficient documentation

## 2020-09-23 DIAGNOSIS — I10 Essential (primary) hypertension: Secondary | ICD-10-CM | POA: Insufficient documentation

## 2020-09-23 DIAGNOSIS — C20 Malignant neoplasm of rectum: Secondary | ICD-10-CM | POA: Insufficient documentation

## 2020-09-23 DIAGNOSIS — G629 Polyneuropathy, unspecified: Secondary | ICD-10-CM | POA: Diagnosis not present

## 2020-09-23 DIAGNOSIS — F32A Depression, unspecified: Secondary | ICD-10-CM | POA: Insufficient documentation

## 2020-09-23 LAB — CEA (IN HOUSE-CHCC): CEA (CHCC-In House): <1 ng/mL (ref 0.00–5.00)

## 2020-09-23 NOTE — Telephone Encounter (Signed)
Scheduled appointments per 2/17 los. Spoke to patient who is aware of appointments date and times. Gave patient card with appointments written down.

## 2020-09-23 NOTE — Progress Notes (Signed)
[place] OFFICE PROGRESS NOTE   Diagnosis: Rectal cancer  INTERVAL HISTORY:   Donna Brandt returns as scheduled.  She feels well.  She continues to have irregular bowel habits.  No rectal bleeding.  She has not completed pelvic physical therapy.  She was diagnosed with COVID-19 in January.  No rectal bleeding.  Neuropathy symptoms remain improved.  Objective:  Vital signs in last 24 hours:  Blood pressure 119/79, pulse 85, temperature 97.8 F (36.6 C), temperature source Tympanic, resp. rate 17, height _0  (1.676 m), weight 200 lb (90.7 kg), SpO2 100 %.    Lymphatics: No cervical, supraclavicular, axillary, or inguinal nodes Resp: Lungs clear bilaterally Cardio: Regular rate and rhythm GI: No mass, nontender, no hepatosplenomegaly Vascular: No leg edema  Lab Results:  Lab Results  Component Value Date   WBC 4.4 02/12/2019   HGB 14.1 02/12/2019   HCT 42.3 02/12/2019   MCV 77.3 (L) 02/12/2019   PLT 215 02/12/2019   NEUTROABS 2.4 11/16/2017    CMP  Lab Results  Component Value Date   NA 139 02/12/2019   K 4.0 02/12/2019   CL 103 02/12/2019   CO2 24 02/12/2019   GLUCOSE 93 02/12/2019   BUN 8 02/12/2019   CREATININE 0.56 02/12/2019   CALCIUM 10.6 (H) 02/12/2019   PROT 7.7 04/10/2018   ALBUMIN 2.8 (L) 07/09/2018   AST 21 11/16/2017   ALT 23 11/16/2017   ALKPHOS 114 11/16/2017   BILITOT 0.4 11/16/2017   GFRNONAA >60 02/12/2019   GFRAA >60 02/12/2019    Lab Results  Component Value Date   CEA1 <1.00 02/16/2020    Medications: I have reviewed the patient's current medications.   Assessment/Plan: 1. Rectal cancer, clinical stage T3b,N0,M0  colonoscopy 03/06/2017-biopsy of a rectal mass revealed fragments of an adenomatous lesion with high-grade dysplasia, definite submucosa not available to evaluate for invasion  Proctoscopy/biopsy 03/23/2017 confirmed a mass at 6-7 centimeters from the anal verge, biopsy revealed polypoid colorectal  mucosa with detached fragments of adenomatous mucosa  Staging CTs 03/16/2017, anterior rectal mass, no evidence of metastatic disease, no adenopathy,  MRI 03/21/2017-T3b,N0rectal tumor  Initiationof radiation and concurrent Xeloda 04/23/2017.Completed 05/30/2017.  Laparoscopic low anterior resection and diverting ileostomy 07/26/2017,ypT2,ypN0tumor with negative surgical margins, normal mismatch repair protein expression  Cycle 1 adjuvant Xeloda 08/20/2017  Xeloda placed on hold beginning 08/28/2017 due to bilateral leg numbness  Xeloda discontinued  Flexible sigmoidoscopy 10/19/2017-patent end-to-end colocolonic anastomosis characterized by healthy-appearing mucosa. No specimens collected.  Surveillance colonoscopy 07/16/2018- distal rectum colo-colonic anastomosis normal.  Examination of the colon to the cecum otherwise normal.  No polyps or cancers.  Repeat colonoscopy in 3 years for surveillance.  2. Hypertension  3. 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 MRI lumbar spine 09/05/2017-no evidence of metastatic disease to the lumbosacral spine. No stenosis or neural compression. Fatty marrow changes from mid L5 through the sacrum presumably secondary to previous radiation.Leg strength improved on exam 09/06/2017. Per patient report numbness improved, still present over the soles of both feet.Evaluated by neurology 04/10/2018-diagnosed with neuropathy, started on Lyrica.  Neuropathy improved.  6.Ileostomy reversal 11/28/2017    Disposition: Donna Brandt is in clinical remission from rectal cancer.  We will follow up on the CEA from today.  She will be due for a surveillance colonoscopy later this year.  I made a referral to Dr. Ardis Hughs.  Donna Brandt continues to have irregular bowel habits, likely secondary to surgery and pelvic radiation.  We  made a referral to the pelvic physical therapy  clinic.  She will return for an office visit and CEA in 6 months.  Betsy Coder, MD  09/23/2020  8:12 AM

## 2020-09-24 DIAGNOSIS — L918 Other hypertrophic disorders of the skin: Secondary | ICD-10-CM | POA: Diagnosis not present

## 2020-09-24 DIAGNOSIS — A630 Anogenital (venereal) warts: Secondary | ICD-10-CM | POA: Diagnosis not present

## 2020-10-08 ENCOUNTER — Other Ambulatory Visit: Payer: Self-pay

## 2020-10-08 ENCOUNTER — Ambulatory Visit: Payer: Medicaid Other | Attending: Oncology | Admitting: Physical Therapy

## 2020-10-08 ENCOUNTER — Encounter: Payer: Self-pay | Admitting: Physical Therapy

## 2020-10-08 DIAGNOSIS — R252 Cramp and spasm: Secondary | ICD-10-CM | POA: Diagnosis not present

## 2020-10-08 DIAGNOSIS — R279 Unspecified lack of coordination: Secondary | ICD-10-CM | POA: Diagnosis not present

## 2020-10-08 NOTE — Therapy (Signed)
[facility] [address] [place], [place], [address] Phone: [phone]   Fax:  [phone]  Physical Therapy Treatment  Patient Details  Name: Donna Brandt MRN: [id number] Date of Birth: 1975/11/04 Referring Provider (PT): Ladell Pier, MD   Encounter Date: 10/08/2020   PT End of Session - 10/08/20 1108    Visit Number 1    Date for PT Re-Evaluation 12/31/20    Authorization Type medicaid healthy blue    PT Start Time 0801    PT Stop Time 0839    PT Time Calculation (min) 38 min    Activity Tolerance Patient tolerated treatment well    Behavior During Therapy [facility] for tasks assessed/performed           Past Medical History:  Diagnosis Date  . Adjustment disorder with anxious mood 04/08/2019  . Anemia   . Colon cancer ([place]) 2018   rectal  . Complication of anesthesia    during colonoscopy woke up! and wisdom teeth extraction  . HTN (hypertension)   . Major depressive disorder, recurrent ([place])   . Migraine   . Sickle cell trait [facility])     Past Surgical History:  Procedure Laterality Date  . ABDOMINAL HYSTERECTOMY     due to uterine fibroids  . AUGMENTATION MAMMAPLASTY Bilateral   . COLONOSCOPY  07/16/2018  . ENDOMETRIAL ABLATION    . FLEXIBLE SIGMOIDOSCOPY N/A 04/12/2017   Procedure: FLEXIBLE SIGMOIDOSCOPY;  Surgeon: Milus Banister, MD;  Location: [facility];  Service: Endoscopy;  Laterality: N/A;  . FLEXIBLE SIGMOIDOSCOPY N/A 07/18/2017   Procedure: FLEXIBLE SIGMOIDOSCOPY EXAM UNDER ANESTHESIA;  Surgeon: Ileana Roup, MD;  Location: [facility];  Service: General;  Laterality: N/A;  . FLEXIBLE SIGMOIDOSCOPY  07/26/2017   Procedure: FLEXIBLE SIGMOIDOSCOPY;  Surgeon: Ileana Roup, MD;  Location: [facility];  Service: General;;  . FLEXIBLE SIGMOIDOSCOPY N/A 10/19/2017   Procedure: FLEXIBLE SIGMOIDOSCOPY;  Surgeon: Ileana Roup, MD;  Location: [facility];  Service: General;   Laterality: N/A;  . ILEOSTOMY N/A 11/28/2017   Procedure: CLOSURE OF LOOP ILEOSTOMY ERAS PATHWAY;  Surgeon: Ileana Roup, MD;  [facility];  Service: General;  Laterality: N/A;  . ILEOSTOMY REVISION     11-28-17  . LAPAROSCOPIC LOW ANTERIOR RESECTION N/A 07/26/2017   Procedure: LAPAROSCOPIC VS OPEN  LOW ANTERIOR RESECTION WITH [place] LOOP ILEOSTOMY;  Surgeon: Ileana Roup, MD;  [facility];  Service: General;  Laterality: N/A;  . PROCTOSCOPY  07/26/2017   Procedure: PROCTOSCOPY;  Surgeon: Ileana Roup, MD;  [facility];  Service: General;;  . TONSILLECTOMY    . TUBAL LIGATION    . WISDOM TOOTH EXTRACTION      There were no vitals filed for this visit.   Subjective Assessment - 10/08/20 0803    Subjective Pt states she is having constipation and gas and bloating.  I noticed certain foods are very gassy.  Pt states she is having diarrhea about 2x/week, small amounts back and forth 2x/week.  The days when having a lot has to sit for 45-60 minutes    Pertinent History radiation for rectal cancer; ostomy and reversal    Currently in Pain? No/denies              [facility] PT Assessment - 10/08/20 0001      Assessment   Medical Diagnosis C20 (ICD-10-CM) - Rectal cancer [facility])    Referring Provider (PT) Ladell Pier, MD  Onset Date/Surgical Date --   3.5 years ago; CA in 2018   Prior Therapy No      Precautions   Precautions None      Balance Screen   Has the patient fallen in the past 6 months No      [place] residence    Living Arrangements Children   3 kids     Prior Function   Level of Independence Independent    Vocation Full time employment      Cognition   Overall Cognitive Status Within Functional Limits for tasks assessed      Posture/Postural Control   Posture/Postural Control Postural limitations    Postural Limitations Increased lumbar lordosis;Decreased thoracic kyphosis;Anterior  pelvic tilt      ROM / Strength   AROM / PROM / Strength AROM;PROM;Strength      AROM   Overall AROM Comments lumbar 50%      PROM   Overall PROM Comments hip flexion 80%      Strength   Overall Strength Comments hip abduction Rt 4/5; adduction Lt 4/5      Flexibility   Soft Tissue Assessment /Muscle Length yes    Hamstrings Lt 70%; Rt 80%      Palpation   Palpation comment lumbar and gluteals tight      Ambulation/Gait   Gait Pattern Decreased stride length                      Pelvic Floor Special Questions - 10/08/20 0001    Prior Pelvic/Prostate Exam Yes    Prior Pregnancies Yes    Number of Vaginal Deliveries 3    Any difficulty with labor and deliveries No    Urinary Leakage No    Urinary urgency Yes    Fecal incontinence Yes   2x/month sometimes while sleeping   Prolapse --   hemorrhoids and burning, no prolapse   Pelvic Floor Internal Exam pt identity confirmed; informed consent to do internal assessment    Exam Type Rectal    Palpation puborectalis tight and TTP    Strength weak squeeze, no lift    Strength # of reps 1   6 quick flicks only 2 that were 2/5 strength   Strength # of seconds 1    Tone high             [facility] Adult PT Treatment/Exercise - 10/08/20 0001      Self-Care   Self-Care Other Self-Care Comments    Other Self-Care Comments  toileting and squatty potty discussed                  PT Education - 10/08/20 1109    Education Details toileting techniques provided    Person(s) Educated Patient    Methods Explanation    Comprehension Verbalized understanding               PT Long Term Goals - 10/08/20 1058      PT LONG TERM GOAL #1   Title Pt will be ind with advanced HEP for return to walking/jogging program    Time 12    Period Weeks    Status New    Target Date 12/31/20      PT LONG TERM GOAL #2   Title Pt will have BM that is more complete and can finish in less than 10 minutes due to improved  pelvic floor and core muscle coordination  Baseline takes 45-60 minutes    Time 12    Period Weeks    Status New    Target Date 12/31/20      PT LONG TERM GOAL #3   Title Pt will be able to walk for at least 30 minutes without the urge to have a BM    Baseline 15 minutes    Time 12    Period Weeks    Status New    Target Date 12/31/20      PT LONG TERM GOAL #4   Title Pt will report 50% less bouts of diarrhea due to improved pelvic floor functional resting tone    Baseline high tone and diarrhea 2 days/week    Time 12    Period Weeks    Status New    Target Date 12/31/20      PT LONG TERM GOAL #5   Title pt will report 60% less pain with gas or bowel movement    Baseline has pain due to skin irritation    Time 12    Period Weeks    Status New    Target Date 12/31/20                 Plan - 10/08/20 0840    Clinical Impression Statement Pt is 45 y/o female s/p ostomy reversal 3 years ago after a bout with rectal cancer.  Pt is still having a lot of difficulty with BMs and has constipation, diarrhea, frequency, and leakage.  Pt has LE weakness and cannot walk more than 15 minutes to due excessive urgency.  Pt has 2/5 MMT of pelvic floor and unable to hold >1 sec for one rep than has only a flicker. Pt's resting tone is very high and 2 quick flicks in 10 seconds due to lowered ability to relax.  Tone is elevated after doing quick flicks.  Pt has tension in h/s, lumbar, and gluteals with decreased ROM and flexibilty as noted above.  Pt will benefit from skilled PT for improved soft tissue function, strength, and endurance so she can perform normal functional activities and resume normal exercise as part of healthy lifestyle.    Personal Factors and Comorbidities Comorbidity 3+;Time since onset of injury/illness/exacerbation    Comorbidities radiation, ostomy reversal; chemo treatments, 3 vaginal deliveries    Examination-Activity Limitations Toileting;Continence     Examination-Participation Restrictions Community Activity;Occupation    Stability/Clinical Decision Making Evolving/Moderate complexity    Clinical Decision Making Moderate    Rehab Potential Excellent    PT Frequency 1x / week    PT Duration 12 weeks    PT Treatment/Interventions ADLs/Self Care Home Management;Biofeedback;Cryotherapy;Electrical Stimulation;Moist Heat;Traction;Gait training;Therapeutic activities;Therapeutic exercise;Neuromuscular re-education;Patient/family education;Manual techniques;Passive range of motion;Dry needling;Taping    PT Next Visit Plan breathing and bulging, STM to lumbar hip and gluteal stretching    PT Home Exercise Plan toileting techniques    Consulted and Agree with Plan of Care Patient           Patient will benefit from skilled therapeutic intervention in order to improve the following deficits and impairments:  Pain,Postural dysfunction,Impaired flexibility,Increased fascial restricitons,Decreased strength,Decreased coordination,Impaired tone,Decreased range of motion  Visit Diagnosis: Cramp and spasm  Unspecified lack of coordination     Problem List Patient Active Problem List   Diagnosis Date Noted  . Adjustment disorder with anxious mood 04/08/2019  . Major depressive disorder, recurrent ([place])   . Exertional dyspnea 03/27/2019  . Chest pain 02/12/2019  . HTN (hypertension)   .  Rectal cancer ([place]) 07/26/2017  . Adenocarcinoma of rectum ([place]) 04/10/2017    Jule Ser, PT 10/08/2020, 11:16 AM  [facility] [address], [place], [place], [address] Phone: [phone]   Fax:  [phone]  Name: Donna Brandt MRN: [id number] Date of Birth: March 31, 1976

## 2020-10-08 NOTE — Patient Instructions (Addendum)
Toileting Techniques for Bowel Movements (Defecation) Using your belly (abdomen) and pelvic floor muscles to have a bowel movement is usually instinctive.  Sometimes people can have problems with these muscles and have to relearn proper defecation (emptying) techniques.  If you have weakness in your muscles, organs that are falling out, decreased sensation in your pelvis, or ignore your urge to go, you may find yourself straining to have a bowel movement.  You are straining if you are: . holding your breath or taking in a huge gulp of air and holding it  . keeping your lips and jaw tensed and closed tightly . turning red in the face because of excessive pushing or forcing . developing or worsening your  hemorrhoids . getting faint while pushing . not emptying completely and have to defecate many times a day  If you are straining, you are actually making it harder for yourself to have a bowel movement.  Many people find they are pulling up with the pelvic floor muscles and closing off instead of opening the anus. Due to lack pelvic floor relaxation and coordination the abdominal muscles, one has to work harder to push the feces out.  Many people have never been taught how to defecate efficiently and effectively.  Notice what happens to your body when you are having a bowel movement.  While you are sitting on the toilet pay attention to the following areas: . Jaw and mouth position . Angle of your hips   . Whether your feet touch the ground or not . Arm placement  . Spine position . Waist . Belly tension . Anus (opening of the anal canal)  An Evacuation/Defecation Plan   Here are the 4 basic points:  1. Lean forward enough for your elbows to rest on your knees 2. Support your feet on the floor or use a low stool if your feet don't touch the floor  3. Push out your belly as if you have swallowed a beach ball--you should feel a widening of your waist 4. Open and relax your pelvic floor  muscles, rather than tightening around the anus       The following conditions my require modifications to your toileting posture:  . If you have had surgery in the past that limits your back, hip, pelvic, knee or ankle flexibility . Constipation   Your healthcare practitioner may make the following additional suggestions and adjustments:  1) Sit on the toilet  a) Make sure your feet are supported. b) Notice your hip angle and spine position--most people find it effective to lean forward or raise their knees, which can help the muscles around the anus to relax  c) When you lean forward, place your forearms on your thighs for support  2) Relax suggestions a) Breath deeply in through your nose and out slowly through your mouth as if you are smelling the flowers and blowing out the candles. b) To become aware of how to relax your muscles, contracting and releasing muscles can be helpful.  Pull your pelvic floor muscles in tightly by using the image of holding back gas, or closing around the anus (visualize making a circle smaller) and lifting the anus up and in.  Then release the muscles and your anus should drop down and feel open. Repeat 5 times ending with the feeling of relaxation. c) Keep your pelvic floor muscles relaxed; let your belly bulge out. d) The digestive tract starts at the mouth and ends at the anal opening, so  be sure to relax both ends of the tube.  Place your tongue on the roof of your mouth with your teeth separated.  This helps relax your mouth and will help to relax the anus at the same time.  3) Empty (defecation) a) Keep your pelvic floor and sphincter relaxed, then bulge your anal muscles.  Make the anal opening wide.  b) Stick your belly out as if you have swallowed a beach ball. c) Make your belly wall hard using your belly muscles while continuing to breathe. Doing this makes it easier to open your anus. d) Breath out and give a grunt (or try using other sounds  such as ahhhh, shhhhh, ohhhh or grrrrrrr).  4) Finish a) As you finish your bowel movement, pull the pelvic floor muscles up and in.  This will leave your anus in the proper place rather than remaining pushed out and down. If you leave your anus pushed out and down, it will start to feel as though that is normal and give you incorrect signals about needing to have a bowel movement.

## 2020-10-12 DIAGNOSIS — R140 Abdominal distension (gaseous): Secondary | ICD-10-CM | POA: Diagnosis not present

## 2020-10-12 DIAGNOSIS — C20 Malignant neoplasm of rectum: Secondary | ICD-10-CM | POA: Diagnosis not present

## 2020-10-12 DIAGNOSIS — R194 Change in bowel habit: Secondary | ICD-10-CM | POA: Diagnosis not present

## 2020-10-12 DIAGNOSIS — R143 Flatulence: Secondary | ICD-10-CM | POA: Diagnosis not present

## 2020-10-15 ENCOUNTER — Encounter: Payer: Self-pay | Admitting: Physical Therapy

## 2020-10-15 ENCOUNTER — Ambulatory Visit: Payer: Medicaid Other | Admitting: Physical Therapy

## 2020-10-15 ENCOUNTER — Other Ambulatory Visit: Payer: Self-pay

## 2020-10-15 DIAGNOSIS — R252 Cramp and spasm: Secondary | ICD-10-CM

## 2020-10-15 DIAGNOSIS — R279 Unspecified lack of coordination: Secondary | ICD-10-CM | POA: Diagnosis not present

## 2020-10-15 NOTE — Patient Instructions (Signed)
Access Code: [email or web address] URL: [email or web address] Date: 10/15/2020 Prepared by: Jari Favre  Exercises Diaphragmatic Breathing in Supported Child's Pose with Pelvic Floor Relaxation - 1 x daily - 7 x weekly - 3 sets - 10 reps Diaphragmatic Breathing in Child's Pose with Pelvic Floor Relaxation - 1 x daily - 7 x weekly - 1 sets - 5 reps - 30 sec hold Supine Piriformis Stretch - 1 x daily - 7 x weekly - 1 sets - 3 reps - 30 sec hold Supine Piriformis Stretch with Leg Straight - 1 x daily - 7 x weekly - 1 sets - 3 reps - 30 sec hold Supine Hamstring Stretch - 1 x daily - 7 x weekly - 1 sets - 3 reps - 30 sec hold

## 2020-10-15 NOTE — Therapy (Signed)
[facility] [address], [place], [place], [address] Phone: [phone]   Fax:  [phone]  Physical Therapy Treatment  Patient Details  Name: Donna Brandt MRN: [id number] Date of Birth: 05-23-1976 Referring Provider (PT): Ladell Pier, MD   Encounter Date: 10/15/2020   PT End of Session - 10/15/20 0845    Visit Number 2    Date for PT Re-Evaluation 12/31/20    Authorization Type medicaid healthy blue    PT Start Time 0805    PT Stop Time 0843    PT Time Calculation (min) 38 min    Activity Tolerance Patient tolerated treatment well    Behavior During Therapy [facility] for tasks assessed/performed           Past Medical History:  Diagnosis Date  . Adjustment disorder with anxious mood 04/08/2019  . Anemia   . Colon cancer ([place]) 2018   rectal  . Complication of anesthesia    during colonoscopy woke up! and wisdom teeth extraction  . HTN (hypertension)   . Major depressive disorder, recurrent ([place])   . Migraine   . Sickle cell trait [facility])     Past Surgical History:  Procedure Laterality Date  . ABDOMINAL HYSTERECTOMY     due to uterine fibroids  . AUGMENTATION MAMMAPLASTY Bilateral   . COLONOSCOPY  07/16/2018  . ENDOMETRIAL ABLATION    . FLEXIBLE SIGMOIDOSCOPY N/A 04/12/2017   Procedure: FLEXIBLE SIGMOIDOSCOPY;  Surgeon: Milus Banister, MD;  Location: [facility];  Service: Endoscopy;  Laterality: N/A;  . FLEXIBLE SIGMOIDOSCOPY N/A 07/18/2017   Procedure: FLEXIBLE SIGMOIDOSCOPY EXAM UNDER ANESTHESIA;  Surgeon: Ileana Roup, MD;  Location: [facility];  Service: General;  Laterality: N/A;  . FLEXIBLE SIGMOIDOSCOPY  07/26/2017   Procedure: FLEXIBLE SIGMOIDOSCOPY;  Surgeon: Ileana Roup, MD;  [facility];  Service: General;;  . FLEXIBLE SIGMOIDOSCOPY N/A 10/19/2017   Procedure: FLEXIBLE SIGMOIDOSCOPY;  Surgeon: Ileana Roup, MD;  Location: [facility];  Service: General;   Laterality: N/A;  . ILEOSTOMY N/A 11/28/2017   Procedure: CLOSURE OF LOOP ILEOSTOMY ERAS PATHWAY;  Surgeon: Ileana Roup, MD;  [facility];  Service: General;  Laterality: N/A;  . ILEOSTOMY REVISION     11-28-17  . LAPAROSCOPIC LOW ANTERIOR RESECTION N/A 07/26/2017   Procedure: LAPAROSCOPIC VS OPEN  LOW ANTERIOR RESECTION WITH [place] LOOP ILEOSTOMY;  Surgeon: Ileana Roup, MD;  [facility];  Service: General;  Laterality: N/A;  . PROCTOSCOPY  07/26/2017   Procedure: PROCTOSCOPY;  Surgeon: Ileana Roup, MD;  [facility];  Service: General;;  . TONSILLECTOMY    . TUBAL LIGATION    . WISDOM TOOTH EXTRACTION      There were no vitals filed for this visit.   Subjective Assessment - 10/15/20 0843    Subjective About the same    Pertinent History radiation for rectal cancer; ostomy and reversal    Currently in Pain? No/denies                             [facility] Adult PT Treatment/Exercise - 10/15/20 0001      Exercises   Exercises Lumbar      Lumbar Exercises: Stretches   Other Lumbar Stretch Exercise all stretches as seen in intial HEP educated and performed - breathing and bulging with stretches; h/s, piriformis, child pose      Manual Therapy   Manual Therapy  Myofascial release;Soft tissue mobilization    Soft tissue mobilization lumbar and gluteal    Myofascial Release abdomen around colon and rectum                  PT Education - 10/15/20 0838    Education Details Access Code: [email or web address]    Person(s) Educated Patient    Methods Explanation;Demonstration;Tactile cues;Verbal cues;Handout    Comprehension Verbalized understanding;Returned demonstration               PT Long Term Goals - 10/15/20 0843      PT LONG TERM GOAL #1   Title Pt will be ind with advanced HEP for return to walking/jogging program    Status On-going                 Plan - 10/15/20 7124    Clinical Impression Statement  Pt at initial treatment and no progress noted yet.  Pt had release in left abdomen more than Rt. Pt did well with stretching and breathing.  Added to initial HEP. Pt will benefit from skilled PT per plan of care    PT Treatment/Interventions ADLs/Self Care Home Management;Biofeedback;Cryotherapy;Electrical Stimulation;Moist Heat;Traction;Gait training;Therapeutic activities;Therapeutic exercise;Neuromuscular re-education;Patient/family education;Manual techniques;Passive range of motion;Dry needling;Taping    PT Next Visit Plan f/u on initial HEP; abdominal release, internal STM/biofeedback    PT Home Exercise Plan toileting techniques    Consulted and Agree with Plan of Care Patient           Patient will benefit from skilled therapeutic intervention in order to improve the following deficits and impairments:  Pain,Postural dysfunction,Impaired flexibility,Increased fascial restricitons,Decreased strength,Decreased coordination,Impaired tone,Decreased range of motion  Visit Diagnosis: Cramp and spasm  Unspecified lack of coordination     Problem List Patient Active Problem List   Diagnosis Date Noted  . Adjustment disorder with anxious mood 04/08/2019  . Major depressive disorder, recurrent ([place])   . Exertional dyspnea 03/27/2019  . Chest pain 02/12/2019  . HTN (hypertension)   . Rectal cancer ([place]) 07/26/2017  . Adenocarcinoma of rectum ([place]) 04/10/2017    Jule Ser, PT 10/15/2020, 8:45 AM  [facility] [address] [place], [place], [address] Phone: [phone]   Fax:  [phone]  Name: Donna Brandt MRN: [id number] Date of Birth: 1975/12/27

## 2020-10-16 DIAGNOSIS — I10 Essential (primary) hypertension: Secondary | ICD-10-CM | POA: Diagnosis not present

## 2020-10-16 DIAGNOSIS — F329 Major depressive disorder, single episode, unspecified: Secondary | ICD-10-CM | POA: Diagnosis not present

## 2020-10-16 DIAGNOSIS — E119 Type 2 diabetes mellitus without complications: Secondary | ICD-10-CM | POA: Diagnosis not present

## 2020-10-28 ENCOUNTER — Ambulatory Visit: Payer: Medicaid Other | Admitting: Physical Therapy

## 2020-10-29 ENCOUNTER — Other Ambulatory Visit: Payer: Self-pay

## 2020-10-29 ENCOUNTER — Encounter: Payer: Self-pay | Admitting: Physical Therapy

## 2020-10-29 ENCOUNTER — Ambulatory Visit: Payer: Medicaid Other | Admitting: Physical Therapy

## 2020-10-29 DIAGNOSIS — R279 Unspecified lack of coordination: Secondary | ICD-10-CM

## 2020-10-29 DIAGNOSIS — R252 Cramp and spasm: Secondary | ICD-10-CM

## 2020-10-29 NOTE — Therapy (Signed)
[facility] [address] [place], [place], [address] Phone: [phone]   Fax:  [phone]  Physical Therapy Treatment  Patient Details  Name: Donna Brandt MRN: [id number] Date of Birth: May 01, 1976 Referring Provider (PT): Ladell Pier, MD   Encounter Date: 10/29/2020   PT End of Session - 10/29/20 0932    Visit Number 3    Date for PT Re-Evaluation 12/31/20    Authorization Type medicaid healthy blue    PT Start Time 0850    PT Stop Time 0929    PT Time Calculation (min) 39 min    Activity Tolerance Patient tolerated treatment well    Behavior During Therapy [facility] for tasks assessed/performed           Past Medical History:  Diagnosis Date  . Adjustment disorder with anxious mood 04/08/2019  . Anemia   . Colon cancer ([place]) 2018   rectal  . Complication of anesthesia    during colonoscopy woke up! and wisdom teeth extraction  . HTN (hypertension)   . Major depressive disorder, recurrent ([place])   . Migraine   . Sickle cell trait [facility])     Past Surgical History:  Procedure Laterality Date  . ABDOMINAL HYSTERECTOMY     due to uterine fibroids  . AUGMENTATION MAMMAPLASTY Bilateral   . COLONOSCOPY  07/16/2018  . ENDOMETRIAL ABLATION    . FLEXIBLE SIGMOIDOSCOPY N/A 04/12/2017   Procedure: FLEXIBLE SIGMOIDOSCOPY;  Surgeon: Milus Banister, MD;  Location[facility];  Service: Endoscopy;  Laterality: N/A;  . FLEXIBLE SIGMOIDOSCOPY N/A 07/18/2017   Procedure: FLEXIBLE SIGMOIDOSCOPY EXAM UNDER ANESTHESIA;  Surgeon: Ileana Roup, MD;  [facility];  Service: General;  Laterality: N/A;  . FLEXIBLE SIGMOIDOSCOPY  07/26/2017   Procedure: FLEXIBLE SIGMOIDOSCOPY;  Surgeon: Ileana Roup, MD;  [facility];  Service: General;;  . FLEXIBLE SIGMOIDOSCOPY N/A 10/19/2017   Procedure: FLEXIBLE SIGMOIDOSCOPY;  Surgeon: Ileana Roup, MD;  [facility];  Service: General;   Laterality: N/A;  . ILEOSTOMY N/A 11/28/2017   Procedure: CLOSURE OF LOOP ILEOSTOMY ERAS PATHWAY;  Surgeon: Ileana Roup, MD;  [facility];  Service: General;  Laterality: N/A;  . ILEOSTOMY REVISION     11-28-17  . LAPAROSCOPIC LOW ANTERIOR RESECTION N/A 07/26/2017   Procedure: LAPAROSCOPIC VS OPEN  LOW ANTERIOR RESECTION WITH [place] LOOP ILEOSTOMY;  Surgeon: Ileana Roup, MD;  [facility];  Service: General;  Laterality: N/A;  . PROCTOSCOPY  07/26/2017   Procedure: PROCTOSCOPY;  Surgeon: Ileana Roup, MD;  [facility];  Service: General;;  . TONSILLECTOMY    . TUBAL LIGATION    . WISDOM TOOTH EXTRACTION      There were no vitals filed for this visit.   Subjective Assessment - 10/29/20 0853    Subjective Pt is on a new medicine that is causing loose stools    Pertinent History radiation for rectal cancer; ostomy and reversal    Currently in Pain? No/denies                          Pelvic Floor Special Questions - 10/29/20 0001    Pelvic Floor Internal Exam pt identity confirmed; informed consent to do internal assessment             [facility] Adult PT Treatment/Exercise - 10/29/20 0001      Manual Therapy   Manual Therapy Internal Pelvic Floor  Soft tissue mobilization Lt gluteals    Myofascial Release lumbar paraspinals    Internal Pelvic Floor anal sphincters and puborectalis soft tissue and MFR; gentle coccyx mobs            Trigger Point Dry Needling - 10/29/20 0001    Consent Given? Yes    Education Handout Provided --   verbally reviewed education   Muscles Treated Back/Hip Lumbar multifidi    Lumbar multifidi Response Palpable increased muscle length                     PT Long Term Goals - 10/29/20 0937      PT LONG TERM GOAL #1   Title Pt will be ind with advanced HEP for return to walking/jogging program    Status On-going      PT LONG TERM GOAL #2   Title Pt will have BM that is more  complete and can finish in less than 10 minutes due to improved pelvic floor and core muscle coordination    Status On-going      PT LONG TERM GOAL #3   Title Pt will be able to walk for at least 30 minutes without the urge to have a BM    Status On-going      PT LONG TERM GOAL #4   Title Pt will report 50% less bouts of diarrhea due to improved pelvic floor functional resting tone    Status On-going      PT LONG TERM GOAL #5   Title pt will report 60% less pain with gas or bowel movement    Status On-going                 Plan - 10/29/20 0932    Clinical Impression Statement Pt had palpable soft tissue pliability with all STM and dry needling today.  Pt educated in using pelvic wand and soft tissue release to anal sphincters and puborectalis to work on at home    PT Treatment/Interventions ADLs/Self Care Home Management;Biofeedback;Cryotherapy;Electrical Stimulation;Moist Heat;Traction;Gait training;Therapeutic activities;Therapeutic exercise;Neuromuscular re-education;Patient/family education;Manual techniques;Passive range of motion;Dry needling;Taping    PT Next Visit Plan f/u on dry needling and may try gluteal DN, addaday, foam roll    PT Home Exercise Plan toileting techniques, self massage rectally    Consulted and Agree with Plan of Care Patient           Patient will benefit from skilled therapeutic intervention in order to improve the following deficits and impairments:  Pain,Postural dysfunction,Impaired flexibility,Increased fascial restricitons,Decreased strength,Decreased coordination,Impaired tone,Decreased range of motion  Visit Diagnosis: Cramp and spasm  Unspecified lack of coordination     Problem List Patient Active Problem List   Diagnosis Date Noted  . Adjustment disorder with anxious mood 04/08/2019  . Major depressive disorder, recurrent ([place])   . Exertional dyspnea 03/27/2019  . Chest pain 02/12/2019  . HTN (hypertension)   . Rectal  cancer ([place]) 07/26/2017  . 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 [place], [place], [address] Phone: [phone]   Fax:  [phone]  Name: Donna Brandt MRN: [id number] Date of Birth: January 21, 1976

## 2020-10-30 DIAGNOSIS — C20 Malignant neoplasm of rectum: Secondary | ICD-10-CM | POA: Diagnosis not present

## 2020-10-30 DIAGNOSIS — Z6833 Body mass index (BMI) 33.0-33.9, adult: Secondary | ICD-10-CM | POA: Diagnosis not present

## 2020-10-30 DIAGNOSIS — I10 Essential (primary) hypertension: Secondary | ICD-10-CM | POA: Diagnosis not present

## 2020-10-30 DIAGNOSIS — E876 Hypokalemia: Secondary | ICD-10-CM | POA: Diagnosis not present

## 2020-10-30 DIAGNOSIS — E7800 Pure hypercholesterolemia, unspecified: Secondary | ICD-10-CM | POA: Diagnosis not present

## 2020-10-30 DIAGNOSIS — E119 Type 2 diabetes mellitus without complications: Secondary | ICD-10-CM | POA: Diagnosis not present

## 2020-11-01 ENCOUNTER — Encounter: Payer: Self-pay | Admitting: Physical Therapy

## 2020-11-01 ENCOUNTER — Other Ambulatory Visit: Payer: Self-pay

## 2020-11-01 ENCOUNTER — Ambulatory Visit: Payer: Medicaid Other | Admitting: Physical Therapy

## 2020-11-01 DIAGNOSIS — R252 Cramp and spasm: Secondary | ICD-10-CM | POA: Diagnosis not present

## 2020-11-01 DIAGNOSIS — R279 Unspecified lack of coordination: Secondary | ICD-10-CM

## 2020-11-01 NOTE — Therapy (Signed)
[facility] [address] [place], [place], [address] Phone: [phone]   Fax:  [phone]  Physical Therapy Treatment  Patient Details  Name: Donna Brandt MRN: [id number] Date of Birth: 09/07/1975 Referring Provider (PT): Ladell Pier, MD   Encounter Date: 11/01/2020   PT End of Session - 11/01/20 1540    Visit Number 4    Date for PT Re-Evaluation 12/31/20    Authorization Type medicaid healthy blue    PT Start Time 1535    PT Stop Time 1614    PT Time Calculation (min) 39 min    Activity Tolerance Patient tolerated treatment well    Behavior During Therapy [facility] for tasks assessed/performed           Past Medical History:  Diagnosis Date  . Adjustment disorder with anxious mood 04/08/2019  . Anemia   . Colon cancer ([place]) 2018   rectal  . Complication of anesthesia    during colonoscopy woke up! and wisdom teeth extraction  . HTN (hypertension)   . Major depressive disorder, recurrent ([place])   . Migraine   . Sickle cell trait [facility])     Past Surgical History:  Procedure Laterality Date  . ABDOMINAL HYSTERECTOMY     due to uterine fibroids  . AUGMENTATION MAMMAPLASTY Bilateral   . COLONOSCOPY  07/16/2018  . ENDOMETRIAL ABLATION    . FLEXIBLE SIGMOIDOSCOPY N/A 04/12/2017   Procedure: FLEXIBLE SIGMOIDOSCOPY;  Surgeon: Milus Banister, MD;  Location: [facility];  Service: Endoscopy;  Laterality: N/A;  . FLEXIBLE SIGMOIDOSCOPY N/A 07/18/2017   Procedure: FLEXIBLE SIGMOIDOSCOPY EXAM UNDER ANESTHESIA;  Surgeon: Ileana Roup, MD;  [facility]: [facility];  Service: General;  Laterality: N/A;  . FLEXIBLE SIGMOIDOSCOPY  07/26/2017   Procedure: FLEXIBLE SIGMOIDOSCOPY;  Surgeon: Ileana Roup, MD;  [facility];  Service: General;;  . FLEXIBLE SIGMOIDOSCOPY N/A 10/19/2017   Procedure: FLEXIBLE SIGMOIDOSCOPY;  Surgeon: Ileana Roup, MD;  Location: [facility];  Service: General;   Laterality: N/A;  . ILEOSTOMY N/A 11/28/2017   Procedure: CLOSURE OF LOOP ILEOSTOMY ERAS PATHWAY;  Surgeon: Ileana Roup, MD;  [facility];  Service: General;  Laterality: N/A;  . ILEOSTOMY REVISION     11-28-17  . LAPAROSCOPIC LOW ANTERIOR RESECTION N/A 07/26/2017   Procedure: LAPAROSCOPIC VS OPEN  LOW ANTERIOR RESECTION WITH [place] LOOP ILEOSTOMY;  Surgeon: Ileana Roup, MD;  [facility];  Service: General;  Laterality: N/A;  . PROCTOSCOPY  07/26/2017   Procedure: PROCTOSCOPY;  Surgeon: Ileana Roup, MD;  [facility];  Service: General;;  . TONSILLECTOMY    . TUBAL LIGATION    . WISDOM TOOTH EXTRACTION      There were no vitals filed for this visit.   Subjective Assessment - 11/01/20 1537    Subjective I started going a lot over 45 minutes on Saturday and relieved on Sunday afternoon.  It was still a little bit and then finally it was diarrheah.  It was less pain    Pertinent History radiation for rectal cancer; ostomy and reversal    Currently in Pain? No/denies                             [facility] Adult PT Treatment/Exercise - 11/01/20 0001      Manual Therapy   Manual therapy comments pt informed and consent given to perform internal STM    Myofascial  Release abdomen around colon and rectum    Internal Pelvic Floor anal sphincters and puborectalis soft tissue and MFR; gentle coccyx mobs                       PT Long Term Goals - 10/29/20 8563      PT LONG TERM GOAL #1   Title Pt will be ind with advanced HEP for return to walking/jogging program    Status On-going      PT LONG TERM GOAL #2   Title Pt will have BM that is more complete and can finish in less than 10 minutes due to improved pelvic floor and core muscle coordination    Status On-going      PT LONG TERM GOAL #3   Title Pt will be able to walk for at least 30 minutes without the urge to have a BM    Status On-going      PT LONG TERM  GOAL #4   Title Pt will report 50% less bouts of diarrhea due to improved pelvic floor functional resting tone    Status On-going      PT LONG TERM GOAL #5   Title pt will report 60% less pain with gas or bowel movement    Status On-going                 Plan - 11/01/20 1738    Clinical Impression Statement Pt tolerated internal soft tissue release.  Pt had improved soft tiss without as much tension as previous session. Today's session also focused on abdominal fascial release.  Pt did well with some movement ilicited around ascending colon and sigmoid colon    PT Treatment/Interventions ADLs/Self Care Home Management;Biofeedback;Cryotherapy;Electrical Stimulation;Moist Heat;Traction;Gait training;Therapeutic activities;Therapeutic exercise;Neuromuscular re-education;Patient/family education;Manual techniques;Passive range of motion;Dry needling;Taping    PT Next Visit Plan revisit dry needling and may try gluteal DN, addaday, foam roll, fascial release and internal pelvic floor    PT Home Exercise Plan toileting techniques, self massage rectally    Consulted and Agree with Plan of Care Patient           Patient will benefit from skilled therapeutic intervention in order to improve the following deficits and impairments:  Pain,Postural dysfunction,Impaired flexibility,Increased fascial restricitons,Decreased strength,Decreased coordination,Impaired tone,Decreased range of motion  Visit Diagnosis: Cramp and spasm  Unspecified lack of coordination     Problem List Patient Active Problem List   Diagnosis Date Noted  . Adjustment disorder with anxious mood 04/08/2019  . Major depressive disorder, recurrent ([place])   . Exertional dyspnea 03/27/2019  . Chest pain 02/12/2019  . HTN (hypertension)   . Rectal cancer ([place]) 07/26/2017  . 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, [place], [place], [address] Phone: [phone]   Fax:  [phone]  Name: Donna Brandt MRN: [id number] Date of Birth: Jul 30, 1976

## 2020-11-08 DIAGNOSIS — F330 Major depressive disorder, recurrent, mild: Secondary | ICD-10-CM | POA: Diagnosis not present

## 2020-11-08 DIAGNOSIS — F411 Generalized anxiety disorder: Secondary | ICD-10-CM | POA: Diagnosis not present

## 2020-11-09 ENCOUNTER — Encounter: Payer: Self-pay | Admitting: Physical Therapy

## 2020-11-09 ENCOUNTER — Other Ambulatory Visit: Payer: Self-pay

## 2020-11-09 ENCOUNTER — Ambulatory Visit: Payer: Medicaid Other | Attending: Oncology | Admitting: Physical Therapy

## 2020-11-09 DIAGNOSIS — R252 Cramp and spasm: Secondary | ICD-10-CM | POA: Insufficient documentation

## 2020-11-09 DIAGNOSIS — R279 Unspecified lack of coordination: Secondary | ICD-10-CM | POA: Diagnosis not present

## 2020-11-09 NOTE — Therapy (Signed)
[facility] [address], [place], [place], [address] Phone: [phone]   Fax:  [phone]  Physical Therapy Treatment  Patient Details  Name: Donna Brandt MRN: [id number] Date of Birth: November 11, 1975 Referring Provider (PT): Ladell Pier, MD   Encounter Date: 11/09/2020   PT End of Session - 11/09/20 1456    Visit Number 5    Date for PT Re-Evaluation 12/31/20    Authorization Type medicaid healthy blue    PT Start Time 1400    PT Stop Time 1444    PT Time Calculation (min) 44 min    Activity Tolerance Patient tolerated treatment well    Behavior During Therapy [facility] for tasks assessed/performed           Past Medical History:  Diagnosis Date  . Adjustment disorder with anxious mood 04/08/2019  . Anemia   . Colon cancer ([place]) 2018   rectal  . Complication of anesthesia    during colonoscopy woke up! and wisdom teeth extraction  . HTN (hypertension)   . Major depressive disorder, recurrent ([place])   . Migraine   . Sickle cell trait [facility])     Past Surgical History:  Procedure Laterality Date  . ABDOMINAL HYSTERECTOMY     due to uterine fibroids  . AUGMENTATION MAMMAPLASTY Bilateral   . COLONOSCOPY  07/16/2018  . ENDOMETRIAL ABLATION    . FLEXIBLE SIGMOIDOSCOPY N/A 04/12/2017   Procedure: FLEXIBLE SIGMOIDOSCOPY;  Surgeon: Milus Banister, MD;  Location: [facility];  Service: Endoscopy;  Laterality: N/A;  . FLEXIBLE SIGMOIDOSCOPY N/A 07/18/2017   Procedure: FLEXIBLE SIGMOIDOSCOPY EXAM UNDER ANESTHESIA;  Surgeon: Ileana Roup, MD;  [facility];  Service: General;  Laterality: N/A;  . FLEXIBLE SIGMOIDOSCOPY  07/26/2017   Procedure: FLEXIBLE SIGMOIDOSCOPY;  Surgeon: Ileana Roup, MD;  [facility];  Service: General;;  . FLEXIBLE SIGMOIDOSCOPY N/A 10/19/2017   Procedure: FLEXIBLE SIGMOIDOSCOPY;  Surgeon: Ileana Roup, MD;  [facility];  Service: General;   Laterality: N/A;  . ILEOSTOMY N/A 11/28/2017   Procedure: CLOSURE OF LOOP ILEOSTOMY ERAS PATHWAY;  Surgeon: Ileana Roup, MD;  [facility];  Service: General;  Laterality: N/A;  . ILEOSTOMY REVISION     11-28-17  . LAPAROSCOPIC LOW ANTERIOR RESECTION N/A 07/26/2017   Procedure: LAPAROSCOPIC VS OPEN  LOW ANTERIOR RESECTION WITH [place] LOOP ILEOSTOMY;  Surgeon: Ileana Roup, MD;  [facility];  Service: General;  Laterality: N/A;  . PROCTOSCOPY  07/26/2017   Procedure: PROCTOSCOPY;  Surgeon: Ileana Roup, MD;  [facility];  Service: General;;  . TONSILLECTOMY    . TUBAL LIGATION    . WISDOM TOOTH EXTRACTION      There were no vitals filed for this visit.   Subjective Assessment - 11/09/20 1404    Subjective Pt states she is about the same    Pertinent History radiation for rectal cancer; ostomy and reversal    Currently in Pain? No/denies                             [facility] Adult PT Treatment/Exercise - 11/09/20 0001      Self-Care   Self-Care Other Self-Care Comments    Other Self-Care Comments  educated on creams/moisturizers to use perianally; educated on botox to ask if it might be used for her case; educated on using ice peri-anally      Manual Therapy  Manual therapy comments pt informed and consent given to perform internal STM    Soft tissue mobilization bilat gluteals and lumbar paraspinals    Internal Pelvic Floor anal sphincters and puborectalis soft tissue and MFR; gentle coccyx mobs            Trigger Point Dry Needling - 11/09/20 0001    Consent Given? Yes    Education Handout Provided Yes    Muscles Treated Back/Hip Piriformis;Gluteus medius;Lumbar multifidi    Gluteus Medius Response Twitch response elicited;Palpable increased muscle length    Piriformis Response Twitch response elicited;Palpable increased muscle length    Lumbar multifidi Response Palpable increased muscle length                      PT Long Term Goals - 11/09/20 1423      PT LONG TERM GOAL #1   Title Pt will be ind with advanced HEP for return to walking/jogging program    Status On-going      PT LONG TERM GOAL #2   Title Pt will have BM that is more complete and can finish in less than 10 minutes due to improved pelvic floor and core muscle coordination    Status On-going      PT LONG TERM GOAL #3   Title Pt will be able to walk for at least 30 minutes without the urge to have a BM    Status On-going      PT LONG TERM GOAL #4   Title Pt will report 50% less bouts of diarrhea due to improved pelvic floor functional resting tone    Status On-going      PT LONG TERM GOAL #5   Title pt will report 60% less pain with gas or bowel movement    Status On-going                 Plan - 11/09/20 1451    Clinical Impression Statement Pt was given self care education on lubricants, moisturizers, and creams and samples of desert harvest given.  Pt was very tight at internal sphincter and puborectalis today.  States she had some bleeding 2 days ago.  Discussed botox injections as possible treatment and will pass this on to her MD to see if he thinks it is something that might help.  Pt responded well to dry needling tolumbar and gluteals with increased soft tissue length    PT Treatment/Interventions ADLs/Self Care Home Management;Biofeedback;Cryotherapy;Electrical Stimulation;Moist Heat;Traction;Gait training;Therapeutic activities;Therapeutic exercise;Neuromuscular re-education;Patient/family education;Manual techniques;Passive range of motion;Dry needling;Taping    PT Next Visit Plan DN #3 lumbar and #2 to gluteals if she felt that helped, addaday and foam roll fascial release, continue pelvic floor release as long as not having a lot of skin irritations or possibly use desert harvest    PT Home Exercise Plan toileting techniques, self massage rectally    Consulted and Agree with Plan of Care  Patient           Patient will benefit from skilled therapeutic intervention in order to improve the following deficits and impairments:  Pain,Postural dysfunction,Impaired flexibility,Increased fascial restricitons,Decreased strength,Decreased coordination,Impaired tone,Decreased range of motion  Visit Diagnosis: Cramp and spasm  Unspecified lack of coordination     Problem List Patient Active Problem List   Diagnosis Date Noted  . Adjustment disorder with anxious mood 04/08/2019  . Major depressive disorder, recurrent ([place])   . Exertional dyspnea 03/27/2019  . Chest pain 02/12/2019  . HTN (hypertension)   .  Rectal cancer ([place]) 07/26/2017  . 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, [place], [place], [address] Phone: [phone]   Fax:  [phone]  Name: Donna Brandt MRN: [id number] Date of Birth: 04/20/1976

## 2020-11-18 ENCOUNTER — Encounter: Payer: Medicaid Other | Admitting: Physical Therapy

## 2020-11-24 ENCOUNTER — Encounter: Payer: Medicaid Other | Admitting: Physical Therapy

## 2020-11-26 ENCOUNTER — Ambulatory Visit: Payer: Medicaid Other | Admitting: Physical Therapy

## 2020-11-26 ENCOUNTER — Other Ambulatory Visit: Payer: Self-pay

## 2020-11-26 ENCOUNTER — Encounter: Payer: Self-pay | Admitting: Physical Therapy

## 2020-11-26 DIAGNOSIS — R279 Unspecified lack of coordination: Secondary | ICD-10-CM

## 2020-11-26 DIAGNOSIS — R252 Cramp and spasm: Secondary | ICD-10-CM

## 2020-11-26 NOTE — Therapy (Signed)
[facility] [address], [place], [place], [address] Phone: [phone]   Fax:  [phone]  Physical Therapy Treatment  Patient Details  Name: Donna Brandt MRN: [id number] Date of Birth: Mar 31, 1976 Referring Provider (PT): Ladell Pier, MD   Encounter Date: 11/26/2020   PT End of Session - 11/26/20 0841    Visit Number 6    Date for PT Re-Evaluation 12/31/20    Authorization Type medicaid healthy blue    PT Start Time 0805    PT Stop Time 0849    PT Time Calculation (min) 44 min    Activity Tolerance Patient tolerated treatment well    Behavior During Therapy [facility] for tasks assessed/performed           Past Medical History:  Diagnosis Date  . Adjustment disorder with anxious mood 04/08/2019  . Anemia   . Colon cancer ([place]) 2018   rectal  . Complication of anesthesia    during colonoscopy woke up! and wisdom teeth extraction  . HTN (hypertension)   . Major depressive disorder, recurrent ([place])   . Migraine   . Sickle cell trait [facility])     Past Surgical History:  Procedure Laterality Date  . ABDOMINAL HYSTERECTOMY     due to uterine fibroids  . AUGMENTATION MAMMAPLASTY Bilateral   . COLONOSCOPY  07/16/2018  . ENDOMETRIAL ABLATION    . FLEXIBLE SIGMOIDOSCOPY N/A 04/12/2017   Procedure: FLEXIBLE SIGMOIDOSCOPY;  Surgeon: Milus Banister, MD;  Location[facility];  Service: Endoscopy;  Laterality: N/A;  . FLEXIBLE SIGMOIDOSCOPY N/A 07/18/2017   Procedure: FLEXIBLE SIGMOIDOSCOPY EXAM UNDER ANESTHESIA;  Surgeon: Ileana Roup, MD;  [facility];  Service: General;  Laterality: N/A;  . FLEXIBLE SIGMOIDOSCOPY  07/26/2017   Procedure: FLEXIBLE SIGMOIDOSCOPY;  Surgeon: Ileana Roup, MD;  [facility];  Service: General;;  . FLEXIBLE SIGMOIDOSCOPY N/A 10/19/2017   Procedure: FLEXIBLE SIGMOIDOSCOPY;  Surgeon: Ileana Roup, MD;  [facility]: [facility];  Service: General;   Laterality: N/A;  . ILEOSTOMY N/A 11/28/2017   Procedure: CLOSURE OF LOOP ILEOSTOMY ERAS PATHWAY;  Surgeon: Ileana Roup, MD;  [facility];  Service: General;  Laterality: N/A;  . ILEOSTOMY REVISION     11-28-17  . LAPAROSCOPIC LOW ANTERIOR RESECTION N/A 07/26/2017   Procedure: LAPAROSCOPIC VS OPEN  LOW ANTERIOR RESECTION WITH [place] LOOP ILEOSTOMY;  Surgeon: Ileana Roup, MD;  [facility];  Service: General;  Laterality: N/A;  . PROCTOSCOPY  07/26/2017   Procedure: PROCTOSCOPY;  Surgeon: Ileana Roup, MD;  [facility];  Service: General;;  . TONSILLECTOMY    . TUBAL LIGATION    . WISDOM TOOTH EXTRACTION      There were no vitals filed for this visit.   Subjective Assessment - 11/26/20 0808    Subjective Pt states last week was the worst with just going constantly.    Pertinent History radiation for rectal cancer; ostomy and reversal    Currently in Pain? No/denies                             [facility] Adult PT Treatment/Exercise - 11/26/20 0001      Self-Care   Other Self-Care Comments  ice in glove and use to numb pain rectally      Neuro Re-ed    Neuro Re-ed Details  tactile cues for relax and bulge with diaphragmatic breathing; contrac and relax  with tactile cues      Manual Therapy   Manual therapy comments pt identity confirmed and informed consent given to perform internal STM    Internal Pelvic Floor anal sphincters and puborectalis soft tissue and MFR; gentle coccyx mobs                       PT Long Term Goals - 11/26/20 1002      PT LONG TERM GOAL #1   Title Pt will be ind with advanced HEP for return to walking/jogging program    Status On-going      PT LONG TERM GOAL #2   Title Pt will have BM that is more complete and can finish in less than 10 minutes due to improved pelvic floor and core muscle coordination    Status On-going      PT LONG TERM GOAL #3   Title Pt will be able to walk for  at least 30 minutes without the urge to have a BM    Status On-going                 Plan - 11/26/20 0956    Clinical Impression Statement Pt  tolerated soft tissue release well today, still holding a lot of tension.  Educated in using ice to numb before doing STM at home.  Pt was given TC and VC during practice relaxing and bulging with breathing.  Pt needs extra time for restoring function of pelvic floor and increase muscle length due to a lot of tension that has been issue ongoing for 4 years.  Pt will benefit from skilled PT to continue to address impairments and is expected to continue to make progress.    PT Treatment/Interventions ADLs/Self Care Home Management;Biofeedback;Cryotherapy;Electrical Stimulation;Moist Heat;Traction;Gait training;Therapeutic activities;Therapeutic exercise;Neuromuscular re-education;Patient/family education;Manual techniques;Passive range of motion;Dry needling;Taping    PT Next Visit Plan re-eval next; DN #3 lumbar and #2 to gluteals, continue pelvic floor release f/u on ice and botox rec?    PT Home Exercise Plan toileting techniques, self massage rectally, ice in glove    Recommended Other Services Botox?    Consulted and Agree with Plan of Care Patient           Patient will benefit from skilled therapeutic intervention in order to improve the following deficits and impairments:  Pain,Postural dysfunction,Impaired flexibility,Increased fascial restricitons,Decreased strength,Decreased coordination,Impaired tone,Decreased range of motion  Visit Diagnosis: Cramp and spasm  Unspecified lack of coordination     Problem List Patient Active Problem List   Diagnosis Date Noted  . Adjustment disorder with anxious mood 04/08/2019  . Major depressive disorder, recurrent ([place])   . Exertional dyspnea 03/27/2019  . Chest pain 02/12/2019  . HTN (hypertension)   . Rectal cancer ([place]) 07/26/2017  . 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, [place], [place], [address] Phone: [phone]   Fax:  [phone]  Name: Donna Brandt MRN: [id number] Date of Birth: 12-08-1975

## 2020-12-03 ENCOUNTER — Encounter: Payer: Self-pay | Admitting: Physical Therapy

## 2020-12-03 ENCOUNTER — Ambulatory Visit: Payer: Medicaid Other | Admitting: Physical Therapy

## 2020-12-03 ENCOUNTER — Other Ambulatory Visit: Payer: Self-pay

## 2020-12-03 DIAGNOSIS — R279 Unspecified lack of coordination: Secondary | ICD-10-CM

## 2020-12-03 DIAGNOSIS — R252 Cramp and spasm: Secondary | ICD-10-CM | POA: Diagnosis not present

## 2020-12-03 NOTE — Therapy (Signed)
[facility] [address] [place], [place], [address] Phone: [phone]   Fax:  [phone]  Physical Therapy Treatment  Patient Details  Name: Donna Brandt MRN: [id number] Date of Birth: 07-17-1976 Referring Provider (PT): Ladell Pier, MD   Encounter Date: 12/03/2020   PT End of Session - 12/03/20 0935    Visit Number 7    Date for PT Re-Evaluation 12/31/20    Authorization Type medicaid healthy blue    PT Start Time 0805    PT Stop Time 0845    PT Time Calculation (min) 40 min    Activity Tolerance Patient tolerated treatment well    Behavior During Therapy [facility] for tasks assessed/performed           Past Medical History:  Diagnosis Date  . Adjustment disorder with anxious mood 04/08/2019  . Anemia   . Colon cancer ([place]) 2018   rectal  . Complication of anesthesia    during colonoscopy woke up! and wisdom teeth extraction  . HTN (hypertension)   . Major depressive disorder, recurrent ([place])   . Migraine   . Sickle cell trait [facility])     Past Surgical History:  Procedure Laterality Date  . ABDOMINAL HYSTERECTOMY     due to uterine fibroids  . AUGMENTATION MAMMAPLASTY Bilateral   . COLONOSCOPY  07/16/2018  . ENDOMETRIAL ABLATION    . FLEXIBLE SIGMOIDOSCOPY N/A 04/12/2017   Procedure: FLEXIBLE SIGMOIDOSCOPY;  Surgeon: Milus Banister, MD;  Location: [facility];  Service: Endoscopy;  Laterality: N/A;  . FLEXIBLE SIGMOIDOSCOPY N/A 07/18/2017   Procedure: FLEXIBLE SIGMOIDOSCOPY EXAM UNDER ANESTHESIA;  Surgeon: Ileana Roup, MD;  Location: [facility];  Service: General;  Laterality: N/A;  . FLEXIBLE SIGMOIDOSCOPY  07/26/2017   Procedure: FLEXIBLE SIGMOIDOSCOPY;  Surgeon: Ileana Roup, MD;  [facility];  Service: General;;  . FLEXIBLE SIGMOIDOSCOPY N/A 10/19/2017   Procedure: FLEXIBLE SIGMOIDOSCOPY;  Surgeon: Ileana Roup, MD;  Location: [facility];  Service: General;   Laterality: N/A;  . ILEOSTOMY N/A 11/28/2017   Procedure: CLOSURE OF LOOP ILEOSTOMY ERAS PATHWAY;  Surgeon: Ileana Roup, MD;  [facility];  Service: General;  Laterality: N/A;  . ILEOSTOMY REVISION     11-28-17  . LAPAROSCOPIC LOW ANTERIOR RESECTION N/A 07/26/2017   Procedure: LAPAROSCOPIC VS OPEN  LOW ANTERIOR RESECTION WITH [place] LOOP ILEOSTOMY;  Surgeon: Ileana Roup, MD;  [facility];  Service: General;  Laterality: N/A;  . PROCTOSCOPY  07/26/2017   Procedure: PROCTOSCOPY;  Surgeon: Ileana Roup, MD;  [facility];  Service: General;;  . TONSILLECTOMY    . TUBAL LIGATION    . WISDOM TOOTH EXTRACTION      There were no vitals filed for this visit.   Subjective Assessment - 12/03/20 0806    Subjective Pt states it was a rough week with two accidents.  Pt has had less pain with BMs but less control.    Pertinent History radiation for rectal cancer; ostomy and reversal    Currently in Pain? No/denies                          Pelvic Floor Special Questions - 12/03/20 0001    Strength weak squeeze, no lift    Strength # of reps 1   6 quick flicks only 2 that were 2/5 strength   Strength # of seconds 1    Tone high  [place] Adult PT Treatment/Exercise - 12/03/20 0001      Self-Care   Other Self-Care Comments  foam noodle massage to perineum; foam roller massage to gluteals and lumber stretches      Therapeutic Activites    Therapeutic Activities Other Therapeutic Activities    Other Therapeutic Activities toileting after doing self massage; practice relax and breath - able to release gas no pain                  PT Education - 12/03/20 0840    Education Details Access Code: [email or web address]    Person(s) Educated Patient    Methods Explanation;Demonstration;Tactile cues;Verbal cues;Handout    Comprehension Verbalized understanding;Returned demonstration               PT Long Term Goals - 12/03/20  0807      PT LONG TERM GOAL #1   Title Pt will be ind with advanced HEP for return to walking/jogging program    Status On-going      PT LONG TERM GOAL #2   Title Pt will have BM that is more complete and can finish in less than 10 minutes due to improved pelvic floor and core muscle coordination    Baseline takes 45-60 minutes    Status On-going      PT LONG TERM GOAL #3   Title Pt will be able to walk for at least 30 minutes without the urge to have a BM    Baseline depends on what she has eaten and medication    Status On-going      PT LONG TERM GOAL #4   Title Pt will report 50% less bouts of diarrhea due to improved pelvic floor functional resting tone    Baseline has gotten worse    Status On-going      PT LONG TERM GOAL #5   Title pt will report 60% less pain with gas or bowel movement    Baseline lately pain has been 60% less but the urgency is more    Status Achieved                 Plan - 12/03/20 0926    Clinical Impression Statement Pt has complicated history with rectal cancer and ostomy reversal.  Pt has just began to make some progress with pain management and had good response from things added in today's treatment for her to do at home.  Pt continues to have weakness of the pelvic floor and decreased endurance. Pt will benefit from skilled PT to continue to work towards health of pelvic floor through improved soft tissue length along with pain management and gradual addition of strength exercises in order to improve functional activities needed for her job and mental health.    PT Treatment/Interventions ADLs/Self Care Home Management;Biofeedback;Cryotherapy;Electrical Stimulation;Moist Heat;Traction;Gait training;Therapeutic activities;Therapeutic exercise;Neuromuscular re-education;Patient/family education;Manual techniques;Passive range of motion;Dry needling;Taping    PT Next Visit Plan DN#3 lumbar and #2 gluteals as needed, 50% pelvic exercise with hold     PT Home Exercise Plan toileting techniques, self massage rectally, ice in glove    Consulted and Agree with Plan of Care Patient           Patient will benefit from skilled therapeutic intervention in order to improve the following deficits and impairments:  Pain,Postural dysfunction,Impaired flexibility,Increased fascial restricitons,Decreased strength,Decreased coordination,Impaired tone,Decreased range of motion  Visit Diagnosis: Cramp and spasm  Unspecified lack of coordination     Problem List Patient Active Problem List  Diagnosis Date Noted  . Adjustment disorder with anxious mood 04/08/2019  . Major depressive disorder, recurrent ([place])   . Exertional dyspnea 03/27/2019  . Chest pain 02/12/2019  . HTN (hypertension)   . Rectal cancer ([place]) 07/26/2017  . 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 [place], [place], [address] Phone: [phone]   Fax:  [phone]  Name: Donna Brandt MRN: [id number] Date of Birth: [birth year]

## 2020-12-03 NOTE — Patient Instructions (Signed)
Access Code: [email or web address] URL: [email or web address] Date: 12/03/2020 Prepared by: Jari Favre  Exercises Diaphragmatic Breathing in Supported Child's Pose with Pelvic Floor Relaxation - 1 x daily - 7 x weekly - 3 sets - 10 reps Diaphragmatic Breathing in Child's Pose with Pelvic Floor Relaxation - 1 x daily - 7 x weekly - 1 sets - 5 reps - 30 sec hold Supine Piriformis Stretch - 1 x daily - 7 x weekly - 1 sets - 3 reps - 30 sec hold Supine Piriformis Stretch with Leg Straight - 1 x daily - 7 x weekly - 1 sets - 3 reps - 30 sec hold Supine Hamstring Stretch - 1 x daily - 7 x weekly - 1 sets - 3 reps - 30 sec hold Piriformis Mobilization on Foam Roll - 1 x daily - 7 x weekly - 3 sets - 10 reps  Patient Education Trigger Point Dry Needling

## 2020-12-10 ENCOUNTER — Encounter: Payer: Self-pay | Admitting: Physical Therapy

## 2020-12-10 ENCOUNTER — Other Ambulatory Visit: Payer: Self-pay

## 2020-12-10 ENCOUNTER — Ambulatory Visit: Payer: Medicaid Other | Attending: Oncology | Admitting: Physical Therapy

## 2020-12-10 DIAGNOSIS — R252 Cramp and spasm: Secondary | ICD-10-CM | POA: Insufficient documentation

## 2020-12-10 DIAGNOSIS — R279 Unspecified lack of coordination: Secondary | ICD-10-CM | POA: Diagnosis not present

## 2020-12-10 NOTE — Therapy (Signed)
[facility] [address], [place], [place], [address] Phone: [phone]   Fax:  [phone]  Physical Therapy Treatment  Patient Details  Name: Donna Brandt MRN: [id number] Date of Birth: May 30, 1976 Referring Provider (PT): Ladell Pier, MD   Encounter Date: 12/10/2020   PT End of Session - 12/10/20 0906    Visit Number 8    Date for PT Re-Evaluation 12/31/20    Authorization Type medicaid healthy blue    PT Start Time 0807    PT Stop Time 0845    PT Time Calculation (min) 38 min    Activity Tolerance Patient tolerated treatment well    Behavior During Therapy [facility] for tasks assessed/performed           Past Medical History:  Diagnosis Date  . Adjustment disorder with anxious mood 04/08/2019  . Anemia   . Colon cancer ([place]) 2018   rectal  . Complication of anesthesia    during colonoscopy woke up! and wisdom teeth extraction  . HTN (hypertension)   . Major depressive disorder, recurrent ([place])   . Migraine   . Sickle cell trait [facility])     Past Surgical History:  Procedure Laterality Date  . ABDOMINAL HYSTERECTOMY     due to uterine fibroids  . AUGMENTATION MAMMAPLASTY Bilateral   . COLONOSCOPY  07/16/2018  . ENDOMETRIAL ABLATION    . FLEXIBLE SIGMOIDOSCOPY N/A 04/12/2017   Procedure: FLEXIBLE SIGMOIDOSCOPY;  Surgeon: Milus Banister, MD;  Location: [facility];  Service: Endoscopy;  Laterality: N/A;  . FLEXIBLE SIGMOIDOSCOPY N/A 07/18/2017   Procedure: FLEXIBLE SIGMOIDOSCOPY EXAM UNDER ANESTHESIA;  Surgeon: Ileana Roup, MD;  [facility]: [facility];  Service: General;  Laterality: N/A;  . FLEXIBLE SIGMOIDOSCOPY  07/26/2017   Procedure: FLEXIBLE SIGMOIDOSCOPY;  Surgeon: Ileana Roup, MD;  [facility];  Service: General;;  . FLEXIBLE SIGMOIDOSCOPY N/A 10/19/2017   Procedure: FLEXIBLE SIGMOIDOSCOPY;  Surgeon: Ileana Roup, MD;  [facility]: [facility];  Service: General;   Laterality: N/A;  . ILEOSTOMY N/A 11/28/2017   Procedure: CLOSURE OF LOOP ILEOSTOMY ERAS PATHWAY;  Surgeon: Ileana Roup, MD;  [facility];  Service: General;  Laterality: N/A;  . ILEOSTOMY REVISION     11-28-17  . LAPAROSCOPIC LOW ANTERIOR RESECTION N/A 07/26/2017   Procedure: LAPAROSCOPIC VS OPEN  LOW ANTERIOR RESECTION WITH [place] LOOP ILEOSTOMY;  Surgeon: Ileana Roup, MD;  [facility];  Service: General;  Laterality: N/A;  . PROCTOSCOPY  07/26/2017   Procedure: PROCTOSCOPY;  Surgeon: Ileana Roup, MD;  [facility];  Service: General;;  . TONSILLECTOMY    . TUBAL LIGATION    . WISDOM TOOTH EXTRACTION      There were no vitals filed for this visit.   Subjective Assessment - 12/10/20 0810    Subjective I had a rough night last night for 2 hours in the bathroom last night but then everything was out    Pertinent History radiation for rectal cancer; ostomy and reversal    Currently in Pain? No/denies                             [facility] Adult PT Treatment/Exercise - 12/10/20 0001      Lumbar Exercises: Stretches   Other Lumbar Stretch Exercise thoracic flexion and rotation      Manual Therapy   Manual therapy comments skilled palpation of trigger points and landmarks with  use of dry needles    Soft tissue mobilization thoracic paraspinlas, traps, rhomboids, levators            Trigger Point Dry Needling - 12/10/20 0001    Consent Given? Yes    Education Handout Provided Previously provided    Muscles Treated Back/Hip Thoracic multifidi    Thoracic multifidi response Twitch response elicited;Palpable increased muscle length                     PT Long Term Goals - 12/10/20 0923      PT LONG TERM GOAL #1   Title Pt will be ind with advanced HEP for return to walking/jogging program    Status On-going      PT LONG TERM GOAL #2   Title Pt will have BM that is more complete and can finish in less than 10  minutes due to improved pelvic floor and core muscle coordination    Baseline complete but took 2 hours    Status On-going      PT LONG TERM GOAL #3   Title Pt will be able to walk for at least 30 minutes without the urge to have a BM    Baseline depends on what she has eaten and medication    Status On-going      PT LONG TERM GOAL #4   Title Pt will report 50% less bouts of diarrhea due to improved pelvic floor functional resting tone    Baseline did not have diarrhea this week    Status On-going      PT LONG TERM GOAL #5   Title pt will report 60% less pain with gas or bowel movement    Status Achieved                 Plan - 12/10/20 0906    Clinical Impression Statement Pt able to feel fully empty with BM last night but it took over 2 hours.  Pt states this happens about 1x/week.  She did not have any accidents this week.  Pt does feel that the self massage techniques she has done has helped.  Pt gets tension through the thoracic and into her neck causing headaches when she hasn't gone for a while.  Pt was able to feel less tension in the upper back with STM and dry needling techniques.  She has decreased thoracic kyphosis and given thoracic flexion and rotation to continue to lengthen soft tissues that she lengthened during today's session.    PT Treatment/Interventions ADLs/Self Care Home Management;Biofeedback;Cryotherapy;Electrical Stimulation;Moist Heat;Traction;Gait training;Therapeutic activities;Therapeutic exercise;Neuromuscular re-education;Patient/family education;Manual techniques;Passive range of motion;Dry needling;Taping    PT Next Visit Plan continue with thoracic and moving up into cervical mobility    PT Home Exercise Plan toileting techniques, self massage rectally, ice in glove    Consulted and Agree with Plan of Care Patient           Patient will benefit from skilled therapeutic intervention in order to improve the following deficits and impairments:   Pain,Postural dysfunction,Impaired flexibility,Increased fascial restricitons,Decreased strength,Decreased coordination,Impaired tone,Decreased range of motion  Visit Diagnosis: Cramp and spasm  Unspecified lack of coordination     Problem List Patient Active Problem List   Diagnosis Date Noted  . Adjustment disorder with anxious mood 04/08/2019  . Major depressive disorder, recurrent ([place])   . Exertional dyspnea 03/27/2019  . Chest pain 02/12/2019  . HTN (hypertension)   . Rectal cancer ([place]) 07/26/2017  . Adenocarcinoma  of rectum ([place]) 04/10/2017    Jule Ser, PT 12/10/2020, 9:56 AM  [facility] [address], [place], [place], [address] Phone: [phone]   Fax:  [phone]  Name: Donna Brandt MRN: [id number] Date of Birth: March 11, 1976

## 2020-12-18 DIAGNOSIS — R5383 Other fatigue: Secondary | ICD-10-CM | POA: Diagnosis not present

## 2020-12-18 DIAGNOSIS — Z79899 Other long term (current) drug therapy: Secondary | ICD-10-CM | POA: Diagnosis not present

## 2020-12-18 DIAGNOSIS — E559 Vitamin D deficiency, unspecified: Secondary | ICD-10-CM | POA: Diagnosis not present

## 2020-12-18 DIAGNOSIS — I10 Essential (primary) hypertension: Secondary | ICD-10-CM | POA: Diagnosis not present

## 2020-12-18 DIAGNOSIS — Z1159 Encounter for screening for other viral diseases: Secondary | ICD-10-CM | POA: Diagnosis not present

## 2020-12-18 DIAGNOSIS — E7800 Pure hypercholesterolemia, unspecified: Secondary | ICD-10-CM | POA: Diagnosis not present

## 2020-12-18 DIAGNOSIS — C20 Malignant neoplasm of rectum: Secondary | ICD-10-CM | POA: Diagnosis not present

## 2020-12-18 DIAGNOSIS — Z6833 Body mass index (BMI) 33.0-33.9, adult: Secondary | ICD-10-CM | POA: Diagnosis not present

## 2020-12-18 DIAGNOSIS — E119 Type 2 diabetes mellitus without complications: Secondary | ICD-10-CM | POA: Diagnosis not present

## 2020-12-24 ENCOUNTER — Ambulatory Visit: Payer: Medicaid Other | Admitting: Physical Therapy

## 2020-12-24 ENCOUNTER — Telehealth: Payer: Self-pay | Admitting: Physical Therapy

## 2020-12-24 NOTE — Telephone Encounter (Signed)
Patient did not show for appointment.  Patient was called and PT left message to please call us back.  Gustavus Bryant, PT 12/24/20 8:28 AM

## 2020-12-31 ENCOUNTER — Ambulatory Visit: Payer: Medicaid Other | Admitting: Physical Therapy

## 2020-12-31 ENCOUNTER — Encounter: Payer: Self-pay | Admitting: Physical Therapy

## 2020-12-31 ENCOUNTER — Other Ambulatory Visit: Payer: Self-pay

## 2020-12-31 DIAGNOSIS — R279 Unspecified lack of coordination: Secondary | ICD-10-CM | POA: Diagnosis not present

## 2020-12-31 DIAGNOSIS — R252 Cramp and spasm: Secondary | ICD-10-CM | POA: Diagnosis not present

## 2020-12-31 NOTE — Therapy (Signed)
[facility] [address] [place], [place], [address] Phone: [phone]   Fax:  [phone]  Physical Therapy Treatment  Patient Details  Name: Dawnelle Warman MRN: [id number] Date of Birth: April 09, 1976 Referring Provider (PT): Ladell Pier, MD   Encounter Date: 12/31/2020   PT End of Session - 12/31/20 0940    Visit Number 9    Date for PT Re-Evaluation 12/31/20    Authorization Type medicaid healthy blue    PT Start Time 0803    PT Stop Time 0845    PT Time Calculation (min) 42 min    Activity Tolerance Patient tolerated treatment well    Behavior During Therapy [facility] for tasks assessed/performed           Past Medical History:  Diagnosis Date  . Adjustment disorder with anxious mood 04/08/2019  . Anemia   . Colon cancer ([place]) 2018   rectal  . Complication of anesthesia    during colonoscopy woke up! and wisdom teeth extraction  . HTN (hypertension)   . Major depressive disorder, recurrent ([place])   . Migraine   . Sickle cell trait [facility])     Past Surgical History:  Procedure Laterality Date  . ABDOMINAL HYSTERECTOMY     due to uterine fibroids  . AUGMENTATION MAMMAPLASTY Bilateral   . COLONOSCOPY  07/16/2018  . ENDOMETRIAL ABLATION    . FLEXIBLE SIGMOIDOSCOPY N/A 04/12/2017   Procedure: FLEXIBLE SIGMOIDOSCOPY;  Surgeon: Milus Banister, MD;  Location: [facility];  Service: Endoscopy;  Laterality: N/A;  . FLEXIBLE SIGMOIDOSCOPY N/A 07/18/2017   Procedure: FLEXIBLE SIGMOIDOSCOPY EXAM UNDER ANESTHESIA;  Surgeon: Ileana Roup, MD;  Location: [facility];  Service: General;  Laterality: N/A;  . FLEXIBLE SIGMOIDOSCOPY  07/26/2017   Procedure: FLEXIBLE SIGMOIDOSCOPY;  Surgeon: Ileana Roup, MD;  Location: [facility];  Service: General;;  . FLEXIBLE SIGMOIDOSCOPY N/A 10/19/2017   Procedure: FLEXIBLE SIGMOIDOSCOPY;  Surgeon: Ileana Roup, MD;  Location: [facility];  Service: General;   Laterality: N/A;  . ILEOSTOMY N/A 11/28/2017   Procedure: CLOSURE OF LOOP ILEOSTOMY ERAS PATHWAY;  Surgeon: Ileana Roup, MD;  [facility]: [facility];  Service: General;  Laterality: N/A;  . ILEOSTOMY REVISION     11-28-17  . LAPAROSCOPIC LOW ANTERIOR RESECTION N/A 07/26/2017   Procedure: LAPAROSCOPIC VS OPEN  LOW ANTERIOR RESECTION WITH [place] LOOP ILEOSTOMY;  Surgeon: Ileana Roup, MD;  [facility];  Service: General;  Laterality: N/A;  . PROCTOSCOPY  07/26/2017   Procedure: PROCTOSCOPY;  Surgeon: Ileana Roup, MD;  [facility]: [facility];  Service: General;;  . TONSILLECTOMY    . TUBAL LIGATION    . WISDOM TOOTH EXTRACTION      There were no vitals filed for this visit.   Subjective Assessment - 12/31/20 0806    Subjective Pt states she has been sick last week.  All week this week I was having small BMs and yesterday finally went and everything came out explosively.    Pertinent History radiation for rectal cancer; ostomy and reversal                             [facility] Adult PT Treatment/Exercise - 12/31/20 0001      Self-Care   Other Self-Care Comments  discussed using TENS, and for cervical tension foam roller and tennis ball behind base of skull      Manual Therapy   Soft  tissue mobilization thoracic paraspinlas, traps, rhomboids, levators, cervical            Trigger Point Dry Needling - 12/31/20 0001    Consent Given? Yes    Education Handout Provided Previously provided    Lumbar multifidi Response Palpable increased muscle length;Twitch response elicited    Thoracic multifidi response Palpable increased muscle length;Twitch response elicited                     PT Long Term Goals - 12/31/20 0814      PT LONG TERM GOAL #1   Title Pt will be ind with advanced HEP for return to walking/jogging program    Baseline ind with HEP    Status Achieved      PT LONG TERM GOAL #2   Title Pt will have BM that is more  complete and can finish in less than 10 minutes due to improved pelvic floor and core muscle coordination    Baseline 30 minutes usually but multiple; was 2 hours to 45 minutes    Status Partially Met      PT LONG TERM GOAL #3   Title Pt will be able to walk for at least 30 minutes without the urge to have a BM    Status Not Met      PT LONG TERM GOAL #4   Title Pt will report 50% less bouts of diarrhea due to improved pelvic floor functional resting tone    Status Not Met      PT LONG TERM GOAL #5   Title pt will report 60% less pain with gas or bowel movement    Status Achieved                 Plan - 12/31/20 0855    Clinical Impression Statement Pt is doing overally better but still having about the same amount of difficulty emptying completely.  Pt was given education on other ways to release tension to have improved BMs.  Pt will d/c today with HEP    PT Treatment/Interventions ADLs/Self Care Home Management;Biofeedback;Cryotherapy;Electrical Stimulation;Moist Heat;Traction;Gait training;Therapeutic activities;Therapeutic exercise;Neuromuscular re-education;Patient/family education;Manual techniques;Passive range of motion;Dry needling;Taping    PT Next Visit Plan d/c today    Consulted and Agree with Plan of Care Patient           Patient will benefit from skilled therapeutic intervention in order to improve the following deficits and impairments:  Pain,Postural dysfunction,Impaired flexibility,Increased fascial restricitons,Decreased strength,Decreased coordination,Impaired tone,Decreased range of motion  Visit Diagnosis: Cramp and spasm  Unspecified lack of coordination     Problem List Patient Active Problem List   Diagnosis Date Noted  . Adjustment disorder with anxious mood 04/08/2019  . Major depressive disorder, recurrent ([place])   . Exertional dyspnea 03/27/2019  . Chest pain 02/12/2019  . HTN (hypertension)   . Rectal cancer ([place]) 07/26/2017  .  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 [place], [place], [address] Phone: [phone]   Fax:  [phone]  Name: Arpi Diebold MRN: [id number] Date of Birth: [birth year]  PHYSICAL THERAPY DISCHARGE SUMMARY  Visits from Start of Care: 9  Current functional level related to goals / functional outcomes: See above details   Remaining deficits: See above   Education / Equipment: HEP  Plan: Patient agrees to discharge.  Patient goals were partially met. Patient is being discharged due  to lack of progress.  ?????     [employer], PT 12/31/20 9:47 AM

## 2021-01-06 DIAGNOSIS — R945 Abnormal results of liver function studies: Secondary | ICD-10-CM | POA: Diagnosis not present

## 2021-01-14 ENCOUNTER — Encounter: Payer: Medicaid Other | Admitting: Physical Therapy

## 2021-01-28 ENCOUNTER — Encounter: Payer: Medicaid Other | Admitting: Physical Therapy

## 2021-03-22 DIAGNOSIS — Z85048 Personal history of other malignant neoplasm of rectum, rectosigmoid junction, and anus: Secondary | ICD-10-CM | POA: Diagnosis not present

## 2021-03-22 DIAGNOSIS — R7989 Other specified abnormal findings of blood chemistry: Secondary | ICD-10-CM | POA: Diagnosis not present

## 2021-03-22 DIAGNOSIS — K760 Fatty (change of) liver, not elsewhere classified: Secondary | ICD-10-CM | POA: Diagnosis not present

## 2021-03-25 ENCOUNTER — Inpatient Hospital Stay: Payer: Medicaid Other

## 2021-03-25 ENCOUNTER — Inpatient Hospital Stay (HOSPITAL_BASED_OUTPATIENT_CLINIC_OR_DEPARTMENT_OTHER): Payer: Medicaid Other | Admitting: Nurse Practitioner

## 2021-03-25 ENCOUNTER — Inpatient Hospital Stay: Payer: Medicaid Other | Admitting: Nurse Practitioner

## 2021-03-25 ENCOUNTER — Other Ambulatory Visit: Payer: Medicaid Other

## 2021-03-25 ENCOUNTER — Encounter: Payer: Self-pay | Admitting: Nurse Practitioner

## 2021-03-25 ENCOUNTER — Other Ambulatory Visit: Payer: Self-pay

## 2021-03-25 ENCOUNTER — Inpatient Hospital Stay: Payer: Medicaid Other | Attending: Oncology

## 2021-03-25 VITALS — BP 144/98 | HR 71 | Temp 98.2°F | Resp 18 | Ht 66.0 in | Wt 203.4 lb

## 2021-03-25 DIAGNOSIS — T451X5A Adverse effect of antineoplastic and immunosuppressive drugs, initial encounter: Secondary | ICD-10-CM | POA: Insufficient documentation

## 2021-03-25 DIAGNOSIS — F32A Depression, unspecified: Secondary | ICD-10-CM | POA: Insufficient documentation

## 2021-03-25 DIAGNOSIS — G629 Polyneuropathy, unspecified: Secondary | ICD-10-CM | POA: Insufficient documentation

## 2021-03-25 DIAGNOSIS — L271 Localized skin eruption due to drugs and medicaments taken internally: Secondary | ICD-10-CM | POA: Insufficient documentation

## 2021-03-25 DIAGNOSIS — C20 Malignant neoplasm of rectum: Secondary | ICD-10-CM | POA: Diagnosis not present

## 2021-03-25 DIAGNOSIS — I10 Essential (primary) hypertension: Secondary | ICD-10-CM | POA: Diagnosis not present

## 2021-03-25 DIAGNOSIS — R197 Diarrhea, unspecified: Secondary | ICD-10-CM | POA: Diagnosis not present

## 2021-03-25 DIAGNOSIS — Z923 Personal history of irradiation: Secondary | ICD-10-CM | POA: Diagnosis not present

## 2021-03-25 DIAGNOSIS — Z79899 Other long term (current) drug therapy: Secondary | ICD-10-CM | POA: Insufficient documentation

## 2021-03-25 DIAGNOSIS — R531 Weakness: Secondary | ICD-10-CM | POA: Insufficient documentation

## 2021-03-25 LAB — CEA (ACCESS): CEA (CHCC): <1 ng/mL (ref 0.00–5.00)

## 2021-03-25 NOTE — Progress Notes (Signed)
  [place] OFFICE PROGRESS NOTE   Diagnosis: Rectal cancer  INTERVAL HISTORY:   Ms. Bowler returns as scheduled.  Bowel habits continue to be erratic.  She completed pelvic physical therapy and noted no significant improvement.  She developed increased diarrhea with a new diabetes medication.  The medication was discontinued and bowel habits are now back to baseline.  Neuropathy symptoms continue to be improved.  Objective:  Vital signs in last 24 hours:  Blood pressure (!) 144/98, pulse 71, temperature 98.2 F (36.8 C), temperature source Oral, resp. rate 18, height $RemoveBe'5\' 6"'YWuDQVPeR$  (1.676 m), weight 203 lb 6.4 oz (92.3 kg), SpO2 100 %.    Lymphatics: No palpable cervical, supraclavicular, axillary or inguinal lymph nodes. Resp: Lungs clear bilaterally. Cardio: Regular rate and rhythm. GI: Abdomen soft and nontender.  No hepatomegaly. Vascular: No leg edema.   Lab Results:  Lab Results  Component Value Date   WBC 4.4 02/12/2019   HGB 14.1 02/12/2019   HCT 42.3 02/12/2019   MCV 77.3 (L) 02/12/2019   PLT 215 02/12/2019   NEUTROABS 2.4 11/16/2017    Imaging:  No results found.  Medications: I have reviewed the patient's current medications.  Assessment/Plan: Rectal cancer, clinical stage T3b,N0,M0 colonoscopy 03/06/2017-biopsy of a rectal mass revealed fragments of an adenomatous lesion with high-grade dysplasia, definite submucosa not available to evaluate for invasion Proctoscopy/biopsy 03/23/2017 confirmed a mass at 6-7 centimeters from the anal verge, biopsy revealed polypoid colorectal mucosa with detached fragments of adenomatous mucosa Staging CTs 03/16/2017, anterior rectal mass, no evidence of metastatic disease, no adenopathy, MRI 03/21/2017- T3b,N0 rectal tumor Initiation of radiation and concurrent Xeloda 04/23/2017. Completed 05/30/2017. Laparoscopic low anterior resection and diverting ileostomy 07/26/2017,ypT2,ypN0 tumor with negative surgical  margins, normal mismatch repair protein expression Cycle 1 adjuvant Xeloda 08/20/2017 Xeloda placed on hold beginning 08/28/2017 due to bilateral leg numbness Xeloda discontinued Flexible sigmoidoscopy 10/19/2017- patent end-to-end colocolonic anastomosis characterized by healthy-appearing mucosa.  No specimens collected. Surveillance colonoscopy 07/16/2018- distal rectum colo-colonic anastomosis normal.  Examination of the colon to the cecum otherwise normal.  No polyps or cancers.  Repeat colonoscopy in 3 years for surveillance.   2.   Hypertension   3.   Depression   4.   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   Bilateral foot/leg numbness and weakness.  MRI lumbar spine 09/05/2017-no evidence of metastatic disease to the lumbosacral spine.  No stenosis or neural compression.  Fatty marrow changes from mid L5 through the sacrum presumably secondary to previous radiation.  Leg strength improved on exam 09/06/2017.  Per patient report numbness improved, still present over the soles of both feet.    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  Neuropathy improved.   6.   Ileostomy reversal 11/28/2017    Disposition: Ms. Pinch remains in clinical remission from rectal cancer.  We will follow-up on the CEA from today.  She continues surveillance colonoscopy with Dr. Ardis Hughs.  She will return for a CEA and follow-up visit in 6 months.    Ned Card ANP/GNP-BC   03/25/2021  2:41 PM

## 2021-03-28 LAB — CEA (IN HOUSE-CHCC): CEA (CHCC-In House): <1 ng/mL (ref 0.00–5.00)

## 2021-04-10 DIAGNOSIS — E119 Type 2 diabetes mellitus without complications: Secondary | ICD-10-CM | POA: Diagnosis not present

## 2021-04-10 DIAGNOSIS — Z79899 Other long term (current) drug therapy: Secondary | ICD-10-CM | POA: Diagnosis not present

## 2021-04-10 DIAGNOSIS — E7800 Pure hypercholesterolemia, unspecified: Secondary | ICD-10-CM | POA: Diagnosis not present

## 2021-04-10 DIAGNOSIS — R5383 Other fatigue: Secondary | ICD-10-CM | POA: Diagnosis not present

## 2021-04-10 DIAGNOSIS — E559 Vitamin D deficiency, unspecified: Secondary | ICD-10-CM | POA: Diagnosis not present

## 2021-04-10 DIAGNOSIS — I10 Essential (primary) hypertension: Secondary | ICD-10-CM | POA: Diagnosis not present

## 2021-04-10 DIAGNOSIS — Z1159 Encounter for screening for other viral diseases: Secondary | ICD-10-CM | POA: Diagnosis not present

## 2021-04-10 DIAGNOSIS — Z6833 Body mass index (BMI) 33.0-33.9, adult: Secondary | ICD-10-CM | POA: Diagnosis not present

## 2021-04-10 DIAGNOSIS — C20 Malignant neoplasm of rectum: Secondary | ICD-10-CM | POA: Diagnosis not present

## 2021-05-08 DIAGNOSIS — I10 Essential (primary) hypertension: Secondary | ICD-10-CM | POA: Diagnosis not present

## 2021-05-08 DIAGNOSIS — Z6834 Body mass index (BMI) 34.0-34.9, adult: Secondary | ICD-10-CM | POA: Diagnosis not present

## 2021-05-08 DIAGNOSIS — Z23 Encounter for immunization: Secondary | ICD-10-CM | POA: Diagnosis not present

## 2021-05-08 DIAGNOSIS — E669 Obesity, unspecified: Secondary | ICD-10-CM | POA: Diagnosis not present

## 2021-05-08 DIAGNOSIS — R7303 Prediabetes: Secondary | ICD-10-CM | POA: Diagnosis not present

## 2021-05-08 DIAGNOSIS — E7800 Pure hypercholesterolemia, unspecified: Secondary | ICD-10-CM | POA: Diagnosis not present

## 2021-05-08 DIAGNOSIS — C20 Malignant neoplasm of rectum: Secondary | ICD-10-CM | POA: Diagnosis not present

## 2021-05-09 DIAGNOSIS — G4710 Hypersomnia, unspecified: Secondary | ICD-10-CM | POA: Diagnosis not present

## 2021-05-09 DIAGNOSIS — G2581 Restless legs syndrome: Secondary | ICD-10-CM | POA: Diagnosis not present

## 2021-05-09 DIAGNOSIS — I10 Essential (primary) hypertension: Secondary | ICD-10-CM | POA: Diagnosis not present

## 2021-05-09 DIAGNOSIS — C20 Malignant neoplasm of rectum: Secondary | ICD-10-CM | POA: Diagnosis not present

## 2021-05-20 DIAGNOSIS — G4733 Obstructive sleep apnea (adult) (pediatric): Secondary | ICD-10-CM | POA: Diagnosis not present

## 2021-05-20 DIAGNOSIS — G4752 REM sleep behavior disorder: Secondary | ICD-10-CM | POA: Diagnosis not present

## 2021-05-20 DIAGNOSIS — G4736 Sleep related hypoventilation in conditions classified elsewhere: Secondary | ICD-10-CM | POA: Diagnosis not present

## 2021-05-20 DIAGNOSIS — R0902 Hypoxemia: Secondary | ICD-10-CM | POA: Diagnosis not present

## 2021-06-06 DIAGNOSIS — E7800 Pure hypercholesterolemia, unspecified: Secondary | ICD-10-CM | POA: Diagnosis not present

## 2021-06-06 DIAGNOSIS — I10 Essential (primary) hypertension: Secondary | ICD-10-CM | POA: Diagnosis not present

## 2021-06-06 DIAGNOSIS — G2581 Restless legs syndrome: Secondary | ICD-10-CM | POA: Diagnosis not present

## 2021-06-06 DIAGNOSIS — G4733 Obstructive sleep apnea (adult) (pediatric): Secondary | ICD-10-CM | POA: Diagnosis not present

## 2021-06-26 ENCOUNTER — Encounter: Payer: Self-pay | Admitting: Gastroenterology

## 2021-06-29 ENCOUNTER — Encounter (HOSPITAL_BASED_OUTPATIENT_CLINIC_OR_DEPARTMENT_OTHER): Payer: Self-pay | Admitting: Emergency Medicine

## 2021-06-29 ENCOUNTER — Other Ambulatory Visit: Payer: Self-pay

## 2021-06-29 ENCOUNTER — Emergency Department (HOSPITAL_BASED_OUTPATIENT_CLINIC_OR_DEPARTMENT_OTHER): Payer: Medicaid Other

## 2021-06-29 ENCOUNTER — Emergency Department (HOSPITAL_BASED_OUTPATIENT_CLINIC_OR_DEPARTMENT_OTHER)
Admission: EM | Admit: 2021-06-29 | Discharge: 2021-06-29 | Disposition: A | Discharge status: Home / Self Care | Payer: Medicaid Other | Attending: Emergency Medicine | Admitting: Emergency Medicine

## 2021-06-29 DIAGNOSIS — Z79899 Other long term (current) drug therapy: Secondary | ICD-10-CM | POA: Insufficient documentation

## 2021-06-29 DIAGNOSIS — M545 Low back pain, unspecified: Secondary | ICD-10-CM | POA: Insufficient documentation

## 2021-06-29 DIAGNOSIS — Z85038 Personal history of other malignant neoplasm of large intestine: Secondary | ICD-10-CM | POA: Diagnosis not present

## 2021-06-29 DIAGNOSIS — Z85048 Personal history of other malignant neoplasm of rectum, rectosigmoid junction, and anus: Secondary | ICD-10-CM | POA: Insufficient documentation

## 2021-06-29 DIAGNOSIS — Z91040 Latex allergy status: Secondary | ICD-10-CM | POA: Insufficient documentation

## 2021-06-29 DIAGNOSIS — I10 Essential (primary) hypertension: Secondary | ICD-10-CM | POA: Insufficient documentation

## 2021-06-29 DIAGNOSIS — M542 Cervicalgia: Secondary | ICD-10-CM | POA: Insufficient documentation

## 2021-06-29 DIAGNOSIS — Y92410 Unspecified street and highway as the place of occurrence of the external cause: Secondary | ICD-10-CM | POA: Insufficient documentation

## 2021-06-29 DIAGNOSIS — S199XXA Unspecified injury of neck, initial encounter: Secondary | ICD-10-CM | POA: Diagnosis not present

## 2021-06-29 DIAGNOSIS — R0789 Other chest pain: Secondary | ICD-10-CM | POA: Diagnosis not present

## 2021-06-29 MED ORDER — ACETAMINOPHEN 325 MG PO TABS
650.0000 mg | ORAL_TABLET | Freq: Once | ORAL | Status: AC
  Administered 2021-06-29: 650 mg via ORAL
  Filled 2021-06-29: qty 2

## 2021-06-29 MED ORDER — IBUPROFEN 400 MG PO TABS
600.0000 mg | ORAL_TABLET | Freq: Once | ORAL | Status: AC
  Administered 2021-06-29: 600 mg via ORAL
  Filled 2021-06-29: qty 1

## 2021-06-29 NOTE — Discharge Instructions (Signed)
Call your primary care doctor or specialist as discussed in the next 2-3 days.   Return immediately back to the ER if:  Your symptoms worsen within the next 12-24 hours. You develop new symptoms such as new fevers, persistent vomiting, new pain, shortness of breath, or new weakness or numbness, or if you have any other concerns.  
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
## 2021-07-01 ENCOUNTER — Telehealth: Payer: Self-pay

## 2021-07-01 NOTE — Telephone Encounter (Signed)
Transition Care Management Follow-up Telephone Call Date of discharge and from where: 06/29/2021-[facility] How have you been since you were released from the hospital? Patient stated she is doing ok. Any questions or concerns? No  Items Reviewed: Did the pt receive and understand the discharge instructions provided? Yes  Medications obtained and verified?  No medications  given at discharge Other? No  Any new allergies since your discharge? No  Dietary orders reviewed? No Do you have support at home? Yes   Home Care and Equipment/Supplies: Were home health services ordered? not applicable If so, what is the name of the agency? N/A  Has the agency set up a time to come to the patient's home? not applicable Were any new equipment or medical supplies ordered?  No What is the name of the medical supply agency? N/A Were you able to get the supplies/equipment? not applicable Do you have any questions related to the use of the equipment or supplies? No  Functional Questionnaire: (I = Independent and D = Dependent) ADLs: I  Bathing/Dressing- I  Meal Prep- I  Eating- I  Maintaining continence- I  Transferring/Ambulation- I  Managing Meds- I  Follow up appointments reviewed:  PCP Hospital f/u appt confirmed? No   Specialist Hospital f/u appt confirmed? No   Are transportation arrangements needed? No  If their condition worsens, is the pt aware to call PCP or go to the Emergency Dept.? Yes Was the patient provided with contact information for the PCP's office or ED? Yes Was to pt encouraged to call back with questions or concerns? Yes

## 2021-08-02 DIAGNOSIS — R7303 Prediabetes: Secondary | ICD-10-CM | POA: Diagnosis not present

## 2021-08-02 DIAGNOSIS — Z6836 Body mass index (BMI) 36.0-36.9, adult: Secondary | ICD-10-CM | POA: Diagnosis not present

## 2021-08-02 DIAGNOSIS — I10 Essential (primary) hypertension: Secondary | ICD-10-CM | POA: Diagnosis not present

## 2021-08-02 DIAGNOSIS — E7800 Pure hypercholesterolemia, unspecified: Secondary | ICD-10-CM | POA: Diagnosis not present

## 2021-08-02 DIAGNOSIS — C20 Malignant neoplasm of rectum: Secondary | ICD-10-CM | POA: Diagnosis not present

## 2021-08-04 ENCOUNTER — Encounter: Payer: Self-pay | Admitting: Gastroenterology

## 2021-08-12 DIAGNOSIS — I10 Essential (primary) hypertension: Secondary | ICD-10-CM | POA: Diagnosis not present

## 2021-08-12 DIAGNOSIS — F418 Other specified anxiety disorders: Secondary | ICD-10-CM | POA: Diagnosis not present

## 2021-08-12 DIAGNOSIS — G4733 Obstructive sleep apnea (adult) (pediatric): Secondary | ICD-10-CM | POA: Diagnosis not present

## 2021-08-12 DIAGNOSIS — Z6836 Body mass index (BMI) 36.0-36.9, adult: Secondary | ICD-10-CM | POA: Diagnosis not present

## 2021-08-17 ENCOUNTER — Other Ambulatory Visit: Payer: Self-pay | Admitting: Family Medicine

## 2021-08-17 DIAGNOSIS — Z1231 Encounter for screening mammogram for malignant neoplasm of breast: Secondary | ICD-10-CM

## 2021-08-23 ENCOUNTER — Ambulatory Visit (AMBULATORY_SURGERY_CENTER): Payer: Medicaid Other | Admitting: *Deleted

## 2021-08-23 ENCOUNTER — Other Ambulatory Visit: Payer: Self-pay

## 2021-08-23 VITALS — Ht 66.0 in | Wt 218.0 lb

## 2021-08-23 DIAGNOSIS — Z85048 Personal history of other malignant neoplasm of rectum, rectosigmoid junction, and anus: Secondary | ICD-10-CM

## 2021-08-23 MED ORDER — NA SULFATE-K SULFATE-MG SULF 17.5-3.13-1.6 GM/177ML PO SOLN: 2.0000 | Freq: Once | ORAL | 0 refills | Status: AC

## 2021-08-23 NOTE — Progress Notes (Signed)

## 2021-09-02 ENCOUNTER — Ambulatory Visit
Admission: RE | Admit: 2021-09-02 | Discharge: 2021-09-02 | Disposition: A | Discharge status: Home / Self Care | Payer: Medicaid Other | Source: Ambulatory Visit | Attending: Family Medicine | Admitting: Family Medicine

## 2021-09-02 DIAGNOSIS — Z1231 Encounter for screening mammogram for malignant neoplasm of breast: Secondary | ICD-10-CM

## 2021-09-06 ENCOUNTER — Other Ambulatory Visit: Payer: Self-pay | Admitting: Family Medicine

## 2021-09-06 DIAGNOSIS — R928 Other abnormal and inconclusive findings on diagnostic imaging of breast: Secondary | ICD-10-CM

## 2021-09-09 ENCOUNTER — Other Ambulatory Visit: Payer: Medicaid Other

## 2021-09-13 ENCOUNTER — Ambulatory Visit (AMBULATORY_SURGERY_CENTER): Payer: Medicaid Other | Admitting: Gastroenterology

## 2021-09-13 ENCOUNTER — Encounter: Payer: Self-pay | Admitting: Gastroenterology

## 2021-09-13 VITALS — BP 136/96 | HR 73 | Temp 98.4°F | Resp 16 | Ht 66.0 in | Wt 218.0 lb

## 2021-09-13 DIAGNOSIS — Z85048 Personal history of other malignant neoplasm of rectum, rectosigmoid junction, and anus: Secondary | ICD-10-CM

## 2021-09-13 DIAGNOSIS — D120 Benign neoplasm of cecum: Secondary | ICD-10-CM

## 2021-09-13 DIAGNOSIS — D129 Benign neoplasm of anus and anal canal: Secondary | ICD-10-CM

## 2021-09-13 DIAGNOSIS — D128 Benign neoplasm of rectum: Secondary | ICD-10-CM

## 2021-09-13 MED ORDER — SODIUM CHLORIDE 0.9 % IV SOLN: 500.0000 mL | Freq: Once | INTRAVENOUS | Status: DC

## 2021-09-13 NOTE — Progress Notes (Signed)
Called to room to assist during endoscopic procedure.  Patient ID and intended procedure confirmed with present staff. Received instructions for my participation in the procedure from the performing physician.  

## 2021-09-13 NOTE — Progress Notes (Signed)
Pt's states no medical or surgical changes since previsit or office visit. 

## 2021-09-13 NOTE — Patient Instructions (Signed)
Resume previous diet and medications. °Awaiting pathology results. Repeat Colonoscopy date to be determined based on pathology results. ° °YOU HAD AN ENDOSCOPIC PROCEDURE TODAY AT THE [facility]:   Refer to the procedure report that was given to you for any specific questions about what was found during the examination.  If the procedure report does not answer your questions, please call your gastroenterologist to clarify.  If you requested that your care partner not be given the details of your procedure findings, then the procedure report has been included in a sealed envelope for you to review at your convenience later. ° °YOU SHOULD EXPECT: Some feelings of bloating in the abdomen. Passage of more gas than usual.  Walking can help get rid of the air that was put into your GI tract during the procedure and reduce the bloating. If you had a lower endoscopy (such as a colonoscopy or flexible sigmoidoscopy) you may notice spotting of blood in your stool or on the toilet paper. If you underwent a bowel prep for your procedure, you may not have a normal bowel movement for a few days. ° °Please Note:  You might notice some irritation and congestion in your nose or some drainage.  This is from the oxygen used during your procedure.  There is no need for concern and it should clear up in a day or so. ° °SYMPTOMS TO REPORT IMMEDIATELY: ° °Following lower endoscopy (colonoscopy or flexible sigmoidoscopy): ° Excessive amounts of blood in the stool ° Significant tenderness or worsening of abdominal pains ° Swelling of the abdomen that is new, acute ° Fever of 100°F or higher ° °For urgent or emergent issues, a gastroenterologist can be reached at any hour by calling [phone]. °Do not use MyChart messaging for urgent concerns.  ° ° °DIET:  We do recommend a small meal at first, but then you may proceed to your regular diet.  Drink plenty of fluids but you should avoid alcoholic beverages for 24  hours. ° °ACTIVITY:  You should plan to take it easy for the rest of today and you should NOT DRIVE or use heavy machinery until tomorrow (because of the sedation medicines used during the test).   ° °FOLLOW UP: °Our staff will call the number listed on your records 48-72 hours following your procedure to check on you and address any questions or concerns that you may have regarding the information given to you following your procedure. If we do not reach you, we will leave a message.  We will attempt to reach you two times.  During this call, we will ask if you have developed any symptoms of COVID 19. If you develop any symptoms (ie: fever, flu-like symptoms, shortness of breath, cough etc.) before then, please call [phone].  If you test positive for Covid 19 in the 2 weeks post procedure, please call and report this information to us.   ° °If any biopsies were taken you will be contacted by phone or by letter within the next 1-3 weeks.  Please call us at [phone] if you have not heard about the biopsies in 3 weeks.  ° ° °SIGNATURES/CONFIDENTIALITY: °You and/or your care partner have signed paperwork which will be entered into your electronic medical record.  These signatures attest to the fact that that the information above on your After Visit Summary has been reviewed and is understood.  Full responsibility of the confidentiality of this discharge information lies with you and/or your care-partner.  °

## 2021-09-13 NOTE — Progress Notes (Signed)
Rectal Adenocarcinoma diagnosed 2018 in [place], underwent neoadjuvant chemo/XRT and LAR with ileostomy 2018, eventual ileostomy takedown. Colonoscopy 07/2018 Dr. Ardis Hughs normal examination, normal colo-colonic anastomosis about 2cm from anal verge. Flexible sigmoidoscopy 10/2019 small rectal vault, normal low anastomosis  HPI: This is a woman with h/o colon cancer   ROS: complete GI ROS as described in HPI, all other review negative.  Constitutional:  No unintentional weight loss   Past Medical History:  Diagnosis Date   Adjustment disorder with anxious mood 04/08/2019   Anemia    Colon cancer ([place]) 8338   rectal   Complication of anesthesia    during colonoscopy woke up! and wisdom teeth extraction   HTN (hypertension)    Major depressive disorder, recurrent (HCC)    Migraine    Sickle cell trait ([place])    Sleep apnea     Past Surgical History:  Procedure Laterality Date   ABDOMINAL HYSTERECTOMY     due to uterine fibroids   AUGMENTATION MAMMAPLASTY Bilateral    COLONOSCOPY  07/16/2018   ENDOMETRIAL ABLATION     FLEXIBLE SIGMOIDOSCOPY N/A 04/12/2017   Procedure: FLEXIBLE SIGMOIDOSCOPY;  Surgeon: Milus Banister, MD;  Location: [facility];  Service: Endoscopy;  Laterality: N/A;   FLEXIBLE SIGMOIDOSCOPY N/A 07/18/2017   Procedure: FLEXIBLE SIGMOIDOSCOPY EXAM UNDER ANESTHESIA;  Surgeon: Ileana Roup, MD;  Location: [facility];  Service: General;  Laterality: N/A;   FLEXIBLE SIGMOIDOSCOPY  07/26/2017   Procedure: FLEXIBLE SIGMOIDOSCOPY;  Surgeon: Ileana Roup, MD;  Location: [facility];  Service: General;;   FLEXIBLE SIGMOIDOSCOPY N/A 10/19/2017   Procedure: FLEXIBLE SIGMOIDOSCOPY;  Surgeon: Ileana Roup, MD;  Location: [facility];  Service: General;  Laterality: N/A;   ILEOSTOMY N/A 11/28/2017   Procedure: CLOSURE OF LOOP ILEOSTOMY ERAS PATHWAY;  Surgeon: Ileana Roup, MD;  Location: [facility];  Service: General;  Laterality: N/A;   ILEOSTOMY  REVISION     11-28-17   LAPAROSCOPIC LOW ANTERIOR RESECTION N/A 07/26/2017   Procedure: LAPAROSCOPIC VS OPEN  LOW ANTERIOR RESECTION WITH [place];  Surgeon: Ileana Roup, MD;  Location: [facility];  Service: General;  Laterality: N/A;   PROCTOSCOPY  07/26/2017   Procedure: PROCTOSCOPY;  Surgeon: Ileana Roup, MD;  Location: [facility];  Service: General;;   TONSILLECTOMY     TUBAL LIGATION     WISDOM TOOTH EXTRACTION      Current Outpatient Medications  Medication Sig Dispense Refill   amitriptyline (ELAVIL) 25 MG tablet Take 25 mg by mouth at bedtime.     amlodipine-olmesartan (AZOR) 10-20 MG tablet Take 1 tablet by mouth daily.     escitalopram (LEXAPRO) 10 MG tablet Take 10 mg by mouth daily.     ACETAMINOPHEN-BUTALBITAL 50-325 MG TABS Take 1 tablet by mouth every 6 (six) hours as needed.     cholecalciferol (VITAMIN D3) 25 MCG (1000 UNIT) tablet Take 1,000 Units by mouth daily. (Patient not taking: Reported on 08/23/2021)     EYSUVIS 0.25 % SUSP Apply 1 drop to eye 2 (two) times daily.     RESTASIS 0.05 % ophthalmic emulsion 1 drop 2 (two) times daily.     tiZANidine (ZANAFLEX) 4 MG tablet Take 4 mg by mouth every 8 (eight) hours as needed.     Vitamin D, Ergocalciferol, (DRISDOL) 1.25 MG (50000 UNIT) CAPS capsule Take 50,000 Units by mouth once a week.     Current Facility-Administered Medications  Medication Dose Route Frequency Provider Last Rate Last Admin  0.9 %  sodium chloride infusion  500 mL Intravenous Once Milus Banister, MD        Allergies as of 09/13/2021 - Review Complete 09/13/2021  Allergen Reaction Noted   Sulfa antibiotics Rash 08/21/2013   Dilaudid [hydromorphone hcl] Itching 09/04/2016   Latex Rash and Other (See Comments) 12/11/2016    Family History  Problem Relation Age of Onset   Heart attack Mother    Diabetes Mother    Hypertension Mother    Stroke Mother    Breast cancer Other 71   Hypertension Father    Stroke  Father    Seizures Father    Diabetes Maternal Grandmother    Asthma Son    Asthma Son    Colon cancer Neg Hx    Esophageal cancer Neg Hx    Rectal cancer Neg Hx    Stomach cancer Neg Hx    Colon polyps Neg Hx     Social History   Socioeconomic History   Marital status: Single    Spouse name: Not on file   Number of children: 3   Years of education: 16   Highest education level: Bachelor's degree (e.g., BA, AB, BS)  Occupational History   Not on file  Tobacco Use   Smoking status: Never   Smokeless tobacco: Never  Vaping Use   Vaping Use: Never used  Substance and Sexual Activity   Alcohol use: Yes    Comment: in social settings   Drug use: No   Sexual activity: Yes  Other Topics Concern   Not on file  Social History Narrative   Pt lives in single story home (on the second floor) with her 3 children and her significant other   Has bachelors degree   Works in Press photographer    Social Determinants of Radio broadcast assistant Strain: Not on file  Food Insecurity: Not on file  Transportation Needs: Not on file  Physical Activity: Not on file  Stress: Not on file  Social Connections: Not on file  Intimate Partner Violence: Not on file     Physical Exam: BP 128/79    Pulse 71    Temp 98.4 F (36.9 C)    Ht 5\' 6"  (1.676 m)    Wt 218 lb (98.9 kg)    SpO2 99%    BMI 35.19 kg/m  Constitutional: generally well-appearing Psychiatric: alert and oriented x3 Lungs: CTA bilaterally Heart: no MCR  Assessment and plan: 46 y.o. female with personal h/o colon cancer  Surveillance colonoscopy today  Care is appropriate for the ambulatory setting.  Owens Loffler, MD [facility] 09/13/2021, 10:42 AM

## 2021-09-13 NOTE — Op Note (Signed)
[facility] Patient Name: Donna Brandt Procedure Date: 09/13/2021 10:36 AM MRN: [id number] Endoscopist: Milus Banister , MD Age: 46 Referring MD:  Date of Birth: [birth year] Gender: Female Account #: [id number] Procedure:                Colonoscopy Indications:              High risk colon cancer surveillance: Personal                            history of colon cancer; Rectal Adenocarcinoma                            diagnosed 2018 in [place], underwent neoadjuvant                            chemo/XRT and LAR with ileostomy 2018, eventual                            ileostomy takedown. Colonoscopy 07/2018 Dr. Ardis Hughs                            normal examination, normal colo-colonic anastomosis                            about 2cm from anal verge. Flexible sigmoidoscopy                            10/2019 small rectal vault, normal low anastomosis Medicines:                Monitored Anesthesia Care Procedure:                Pre-Anesthesia Assessment:                           - Prior to the procedure, a History and Physical                            was performed, and patient medications and                            allergies were reviewed. The patient's tolerance of                            previous anesthesia was also reviewed. The risks                            and benefits of the procedure and the sedation                            options and risks were discussed with the patient.                            All questions were answered, and informed consent  was obtained. Prior Anticoagulants: The patient has                            taken no previous anticoagulant or antiplatelet                            agents. ASA Grade Assessment: II - A patient with                            mild systemic disease. After reviewing the risks                            and benefits, the patient was deemed in                             satisfactory condition to undergo the procedure.                           After obtaining informed consent, the colonoscope                            was passed under direct vision. Throughout the                            procedure, the patient's blood pressure, pulse, and                            oxygen saturations were monitored continuously. The                            CF [id number] #[id number] was introduced through the anus                            and advanced to the the cecum, identified by                            appendiceal orifice and ileocecal valve. The                            colonoscopy was performed without difficulty. The                            patient tolerated the procedure well. The quality                            of the bowel preparation was good. The ileocecal                            valve, appendiceal orifice, and rectum were                            photographed. Scope In: 10:51:17 AM Scope Out: 11:04:15 AM Scope Withdrawal Time: 0 hours 9 minutes 53 seconds  Total Procedure Duration: 0 hours  12 minutes 58 seconds  Findings:                 Three sessile polyps were found in the rectum and                            cecum. The polyps were 2 to 3 mm in size. These                            polyps were removed with a cold snare. Resection                            and retrieval were complete.                           Normal colo-colonic anastomosis 1-2cm from the anal                            verge.                           The exam was otherwise without abnormality on                            direct and retroflexion views. Complications:            No immediate complications. Estimated blood loss:                            None. Estimated Blood Loss:     Estimated blood loss: none. Impression:               - Three 2 to 3 mm polyps in the rectum and in the                            cecum, removed with a cold snare. Resected and                             retrieved.                           - Normal colo-colonic anastomosis 1-2cm from the                            anal verge.                           - The examination was otherwise normal on direct                            and retroflexion views. Recommendation:           - Patient has a contact number available for                            emergencies. The signs and symptoms of potential  delayed complications were discussed with the                            patient. Return to normal activities tomorrow.                            Written discharge instructions were provided to the                            patient.                           - Resume previous diet.                           - Continue present medications.                           - Await pathology results. Milus Banister, MD 09/13/2021 11:10:01 AM This report has been signed electronically.

## 2021-09-13 NOTE — Progress Notes (Signed)
Vss nad trans to pacu °

## 2021-09-15 ENCOUNTER — Telehealth: Payer: Self-pay | Admitting: *Deleted

## 2021-09-15 NOTE — Telephone Encounter (Signed)
°  Follow up Call-  Call back number 09/13/2021 10/06/2019  Post procedure Call Back phone  # [phone]  Permission to leave phone message Yes Yes  Some recent data might be hidden     Patient questions:  Do you have a fever, pain , or abdominal swelling? No. Pain Score  0 *  Have you tolerated food without any problems? Yes.    Have you been able to return to your normal activities? Yes.    Do you have any questions about your discharge instructions: Diet   No. Medications  No. Follow up visit  No.  Do you have questions or concerns about your Care? No.  Actions: * If pain score is 4 or above: No action needed, pain <4.

## 2021-09-19 ENCOUNTER — Encounter: Payer: Self-pay | Admitting: Gastroenterology

## 2021-09-27 ENCOUNTER — Ambulatory Visit
Admission: RE | Admit: 2021-09-27 | Discharge: 2021-09-27 | Disposition: A | Discharge status: Home / Self Care | Payer: Medicaid Other | Source: Ambulatory Visit | Attending: Family Medicine | Admitting: Family Medicine

## 2021-09-27 ENCOUNTER — Other Ambulatory Visit: Payer: Self-pay | Admitting: Family Medicine

## 2021-09-27 DIAGNOSIS — R590 Localized enlarged lymph nodes: Secondary | ICD-10-CM | POA: Diagnosis not present

## 2021-09-27 DIAGNOSIS — R928 Other abnormal and inconclusive findings on diagnostic imaging of breast: Secondary | ICD-10-CM

## 2021-09-29 ENCOUNTER — Inpatient Hospital Stay: Payer: Medicaid Other | Admitting: Oncology

## 2021-09-29 ENCOUNTER — Other Ambulatory Visit: Payer: Self-pay

## 2021-09-29 ENCOUNTER — Inpatient Hospital Stay: Payer: Medicaid Other | Attending: Oncology

## 2021-09-29 VITALS — BP 144/84 | HR 82 | Temp 97.8°F | Resp 18 | Ht 66.0 in | Wt 211.8 lb

## 2021-09-29 DIAGNOSIS — C20 Malignant neoplasm of rectum: Secondary | ICD-10-CM | POA: Diagnosis not present

## 2021-09-29 DIAGNOSIS — F32A Depression, unspecified: Secondary | ICD-10-CM | POA: Insufficient documentation

## 2021-09-29 DIAGNOSIS — Z923 Personal history of irradiation: Secondary | ICD-10-CM | POA: Insufficient documentation

## 2021-09-29 DIAGNOSIS — Z79899 Other long term (current) drug therapy: Secondary | ICD-10-CM | POA: Diagnosis not present

## 2021-09-29 DIAGNOSIS — I10 Essential (primary) hypertension: Secondary | ICD-10-CM | POA: Insufficient documentation

## 2021-09-29 DIAGNOSIS — C7951 Secondary malignant neoplasm of bone: Secondary | ICD-10-CM | POA: Insufficient documentation

## 2021-09-29 DIAGNOSIS — G629 Polyneuropathy, unspecified: Secondary | ICD-10-CM | POA: Insufficient documentation

## 2021-09-29 LAB — CEA (ACCESS): CEA (CHCC): <1 ng/mL (ref 0.00–5.00)

## 2021-09-29 NOTE — Progress Notes (Signed)
Donna Brandt OFFICE PROGRESS NOTE   Diagnosis: Rectal cancer  INTERVAL HISTORY:   Donna Brandt returns as scheduled.  She generally feels well.  She continues to have irregular bowel habits that limits her ability to work certain jobs.  She has occasional incontinence.  Pelvic physical therapy did not help. She underwent a colonoscopy by Dr. Ardis Hughs on 09/13/2021.  3 polyps were removed from the rectum and cecum.  The pathology revealed tubular adenomas with mild early adenomatous change.  No high-grade dysplasia or malignancy.  She reports persistent mild intermittent numbness in the feet.  This is markedly improved compared to immediately following chemotherapy.  No consistent leg weakness.  Objective:  Vital signs in last 24 hours:  Blood pressure (!) 144/84, pulse 82, temperature 97.8 F (36.6 C), temperature source Oral, resp. rate 18, height $RemoveBe'5\' 6"'[id number]$  (1.676 m), weight 211 lb 12.8 oz (96.1 kg), SpO2 99 %.    HEENT: Neck without mass Lymphatics: No cervical, supraclavicular, axillary, or inguinal nodes Resp: Lungs clear bilaterally Cardio: Regular rate and rhythm GI: No hepatosplenomegaly, no mass, nontender Vascular: No leg edema   Lab Results:  Lab Results  Component Value Date   WBC 4.4 02/12/2019   HGB 14.1 02/12/2019   HCT 42.3 02/12/2019   MCV 77.3 (L) 02/12/2019   PLT 215 02/12/2019   NEUTROABS 2.4 11/16/2017    CMP  Lab Results  Component Value Date   NA 139 02/12/2019   K 4.0 02/12/2019   CL 103 02/12/2019   CO2 24 02/12/2019   GLUCOSE 93 02/12/2019   BUN 8 02/12/2019   CREATININE 0.56 02/12/2019   CALCIUM 10.6 (H) 02/12/2019   PROT 7.7 04/10/2018   ALBUMIN 2.8 (L) 07/09/2018   AST 21 11/16/2017   ALT 23 11/16/2017   ALKPHOS 114 11/16/2017   BILITOT 0.4 11/16/2017   GFRNONAA >60 02/12/2019   GFRAA >60 02/12/2019    Lab Results  Component Value Date   CEA1 <1.00 03/25/2021   CEA <1.00 03/25/2021     Imaging:  [place] AXILLA  RIGHT  Result Date: 09/27/2021 CLINICAL DATA:  Recall from screening mammography, possible RIGHT axillary lymphadenopathy. EXAM: ULTRASOUND OF THE RIGHT AXILLA COMPARISON:  No prior ultrasound. Prior mammography, most recently 09/02/2021. FINDINGS: There is a solitary RIGHT axillary lymph node demonstrating focal cortical thickening up to 13 mm, corresponding to the screening mammographic finding. The lymph node has maintained its normal morphology with a normal fatty hilum. The remaining RIGHT axillary lymph nodes are normal in appearance with normal cortical thickness. Evaluation LEFT axilla for comparison (at no charge to the patient) shows normal appearing lymph nodes with normal cortical thickness. IMPRESSION: Solitary likely benign reactive RIGHT axillary lymph node with focal cortical thickening up to 13 mm. RECOMMENDATION: Follow-up RIGHT axillary ultrasound in 3 months. I have discussed the findings and recommendations with the patient. If applicable, a reminder letter will be sent to the patient regarding the next appointment. BI-RADS CATEGORY  3: Probably benign. Electronically Signed   By: Evangeline Dakin M.D.   On: 09/27/2021 09:00   Medications: I have reviewed the patient's current medications.   Assessment/Plan: Rectal cancer, clinical stage T3b,N0,M0 colonoscopy 03/06/2017-biopsy of a rectal mass revealed fragments of an adenomatous lesion with high-grade dysplasia, definite submucosa not available to evaluate for invasion Proctoscopy/biopsy 03/23/2017 confirmed a mass at 6-7 centimeters from the anal verge, biopsy revealed polypoid colorectal mucosa with detached fragments of adenomatous mucosa Staging CTs 03/16/2017, anterior rectal mass, no evidence of  metastatic disease, no adenopathy, MRI 03/21/2017- T3b,N0 rectal tumor Initiation of radiation and concurrent Xeloda 04/23/2017. Completed 05/30/2017. Laparoscopic low anterior resection and diverting ileostomy 07/26/2017,ypT2,ypN0  tumor with negative surgical margins, normal mismatch repair protein expression Cycle 1 adjuvant Xeloda 08/20/2017 Xeloda placed on hold beginning 08/28/2017 due to bilateral leg numbness Xeloda discontinued Flexible sigmoidoscopy 10/19/2017- patent end-to-end colocolonic anastomosis characterized by healthy-appearing mucosa.  No specimens collected. Surveillance colonoscopy 07/16/2018- distal rectum colo-colonic anastomosis normal.  Examination of the colon to the cecum otherwise normal.  No polyps or cancers.   Colonoscopy 09/13/2018 23-3 polyps removed from the rectum and cecum, tubular adenomas   2.   Hypertension   3.   Depression   4.   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   Bilateral foot/leg numbness and weakness.  MRI lumbar spine 09/05/2017-no evidence of metastatic disease to the lumbosacral spine.  No stenosis or neural compression.  Fatty marrow changes from mid L5 through the sacrum presumably secondary to previous radiation.  Leg strength improved on exam 09/06/2017.  Per patient report numbness improved, still present over the soles of both feet.    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  Neuropathy improved.   6.   Ileostomy reversal 11/28/2017      Disposition: Donna Brandt is in clinical remission from rectal cancer.  We will follow-up on the CEA from today.  She will return for an office visit in 6 months.  She will continue follow-up with Dr. Ardis Hughs for colonoscopy surveillance.  I recommended she contact Dr. Dema Severin to see if he has any recommendations regarding the irregular bowel habits.  Betsy Coder, MD  09/29/2021  3:06 PM

## 2021-09-30 ENCOUNTER — Telehealth: Payer: Self-pay

## 2021-09-30 NOTE — Telephone Encounter (Signed)
-----   Message from Owens Shark, NP sent at 09/30/2021  8:26 AM EST ----- Please let her know CEA is stable in normal range.

## 2021-09-30 NOTE — Telephone Encounter (Signed)
Called and spoke with the patient to let her know her CEA is stable in normal range. Patient voiced understanding

## 2021-10-04 ENCOUNTER — Encounter: Payer: Self-pay | Admitting: Oncology

## 2021-10-05 ENCOUNTER — Telehealth: Payer: Self-pay | Admitting: *Deleted

## 2021-10-05 NOTE — Telephone Encounter (Signed)
Informed patient that MD has dictated her requested letter and it is ready. She requested it be mailed to her home. Address confirmed and letter put in mail. ?

## 2021-10-20 DIAGNOSIS — I10 Essential (primary) hypertension: Secondary | ICD-10-CM | POA: Diagnosis not present

## 2021-10-20 DIAGNOSIS — G4733 Obstructive sleep apnea (adult) (pediatric): Secondary | ICD-10-CM | POA: Diagnosis not present

## 2021-11-16 DIAGNOSIS — G2581 Restless legs syndrome: Secondary | ICD-10-CM | POA: Diagnosis not present

## 2021-11-16 DIAGNOSIS — Z6835 Body mass index (BMI) 35.0-35.9, adult: Secondary | ICD-10-CM | POA: Diagnosis not present

## 2021-11-16 DIAGNOSIS — G4733 Obstructive sleep apnea (adult) (pediatric): Secondary | ICD-10-CM | POA: Diagnosis not present

## 2021-11-16 DIAGNOSIS — F418 Other specified anxiety disorders: Secondary | ICD-10-CM | POA: Diagnosis not present

## 2021-11-20 DIAGNOSIS — I10 Essential (primary) hypertension: Secondary | ICD-10-CM | POA: Diagnosis not present

## 2021-11-20 DIAGNOSIS — G4733 Obstructive sleep apnea (adult) (pediatric): Secondary | ICD-10-CM | POA: Diagnosis not present

## 2021-12-20 DIAGNOSIS — I10 Essential (primary) hypertension: Secondary | ICD-10-CM | POA: Diagnosis not present

## 2021-12-20 DIAGNOSIS — G4733 Obstructive sleep apnea (adult) (pediatric): Secondary | ICD-10-CM | POA: Diagnosis not present

## 2021-12-28 DIAGNOSIS — Z79899 Other long term (current) drug therapy: Secondary | ICD-10-CM | POA: Diagnosis not present

## 2021-12-28 DIAGNOSIS — G43909 Migraine, unspecified, not intractable, without status migrainosus: Secondary | ICD-10-CM | POA: Diagnosis not present

## 2021-12-28 DIAGNOSIS — Z20822 Contact with and (suspected) exposure to covid-19: Secondary | ICD-10-CM | POA: Diagnosis not present

## 2021-12-28 DIAGNOSIS — R0602 Shortness of breath: Secondary | ICD-10-CM | POA: Diagnosis not present

## 2021-12-28 DIAGNOSIS — E7800 Pure hypercholesterolemia, unspecified: Secondary | ICD-10-CM | POA: Diagnosis not present

## 2021-12-28 DIAGNOSIS — F418 Other specified anxiety disorders: Secondary | ICD-10-CM | POA: Diagnosis not present

## 2021-12-28 DIAGNOSIS — R5383 Other fatigue: Secondary | ICD-10-CM | POA: Diagnosis not present

## 2021-12-28 DIAGNOSIS — Z0000 Encounter for general adult medical examination without abnormal findings: Secondary | ICD-10-CM | POA: Diagnosis not present

## 2021-12-28 DIAGNOSIS — R7303 Prediabetes: Secondary | ICD-10-CM | POA: Diagnosis not present

## 2021-12-28 DIAGNOSIS — E559 Vitamin D deficiency, unspecified: Secondary | ICD-10-CM | POA: Diagnosis not present

## 2021-12-28 DIAGNOSIS — C20 Malignant neoplasm of rectum: Secondary | ICD-10-CM | POA: Diagnosis not present

## 2021-12-28 DIAGNOSIS — I10 Essential (primary) hypertension: Secondary | ICD-10-CM | POA: Diagnosis not present

## 2021-12-28 DIAGNOSIS — Z6834 Body mass index (BMI) 34.0-34.9, adult: Secondary | ICD-10-CM | POA: Diagnosis not present

## 2021-12-30 ENCOUNTER — Ambulatory Visit
Admission: RE | Admit: 2021-12-30 | Discharge: 2021-12-30 | Disposition: A | Discharge status: Home / Self Care | Payer: Medicaid Other | Source: Ambulatory Visit | Attending: Family Medicine | Admitting: Family Medicine

## 2021-12-30 DIAGNOSIS — R928 Other abnormal and inconclusive findings on diagnostic imaging of breast: Secondary | ICD-10-CM

## 2021-12-30 DIAGNOSIS — R590 Localized enlarged lymph nodes: Secondary | ICD-10-CM | POA: Diagnosis not present

## 2022-01-20 DIAGNOSIS — R600 Localized edema: Secondary | ICD-10-CM | POA: Diagnosis not present

## 2022-01-20 DIAGNOSIS — I10 Essential (primary) hypertension: Secondary | ICD-10-CM | POA: Diagnosis not present

## 2022-01-20 DIAGNOSIS — G4733 Obstructive sleep apnea (adult) (pediatric): Secondary | ICD-10-CM | POA: Diagnosis not present

## 2022-01-25 ENCOUNTER — Other Ambulatory Visit: Payer: Self-pay | Admitting: Family Medicine

## 2022-01-25 DIAGNOSIS — R599 Enlarged lymph nodes, unspecified: Secondary | ICD-10-CM

## 2022-01-25 DIAGNOSIS — G4733 Obstructive sleep apnea (adult) (pediatric): Secondary | ICD-10-CM | POA: Diagnosis not present

## 2022-01-26 ENCOUNTER — Other Ambulatory Visit (HOSPITAL_COMMUNITY)
Admission: RE | Admit: 2022-01-26 | Discharge: 2022-01-26 | Disposition: A | Discharge status: Home / Self Care | Payer: Medicaid Other | Source: Ambulatory Visit | Attending: Family Medicine | Admitting: Family Medicine

## 2022-01-26 ENCOUNTER — Ambulatory Visit
Admission: RE | Admit: 2022-01-26 | Discharge: 2022-01-26 | Disposition: A | Discharge status: Home / Self Care | Payer: Medicaid Other | Source: Ambulatory Visit | Attending: Family Medicine | Admitting: Family Medicine

## 2022-01-26 DIAGNOSIS — R599 Enlarged lymph nodes, unspecified: Secondary | ICD-10-CM

## 2022-01-26 DIAGNOSIS — R590 Localized enlarged lymph nodes: Secondary | ICD-10-CM | POA: Diagnosis not present

## 2022-01-26 HISTORY — PX: BREAST BIOPSY: SHX20

## 2022-01-27 LAB — SURGICAL PATHOLOGY

## 2022-02-19 DIAGNOSIS — I10 Essential (primary) hypertension: Secondary | ICD-10-CM | POA: Diagnosis not present

## 2022-02-19 DIAGNOSIS — G4733 Obstructive sleep apnea (adult) (pediatric): Secondary | ICD-10-CM | POA: Diagnosis not present

## 2022-02-20 DIAGNOSIS — E669 Obesity, unspecified: Secondary | ICD-10-CM | POA: Diagnosis not present

## 2022-02-20 DIAGNOSIS — G43909 Migraine, unspecified, not intractable, without status migrainosus: Secondary | ICD-10-CM | POA: Diagnosis not present

## 2022-02-20 DIAGNOSIS — E7800 Pure hypercholesterolemia, unspecified: Secondary | ICD-10-CM | POA: Diagnosis not present

## 2022-02-20 DIAGNOSIS — Z79899 Other long term (current) drug therapy: Secondary | ICD-10-CM | POA: Diagnosis not present

## 2022-02-20 DIAGNOSIS — I10 Essential (primary) hypertension: Secondary | ICD-10-CM | POA: Diagnosis not present

## 2022-02-20 DIAGNOSIS — Z6835 Body mass index (BMI) 35.0-35.9, adult: Secondary | ICD-10-CM | POA: Diagnosis not present

## 2022-02-20 DIAGNOSIS — G4733 Obstructive sleep apnea (adult) (pediatric): Secondary | ICD-10-CM | POA: Diagnosis not present

## 2022-02-20 DIAGNOSIS — E8940 Asymptomatic postprocedural ovarian failure: Secondary | ICD-10-CM | POA: Diagnosis not present

## 2022-02-20 DIAGNOSIS — C20 Malignant neoplasm of rectum: Secondary | ICD-10-CM | POA: Diagnosis not present

## 2022-02-20 DIAGNOSIS — E119 Type 2 diabetes mellitus without complications: Secondary | ICD-10-CM | POA: Diagnosis not present

## 2022-02-20 DIAGNOSIS — F418 Other specified anxiety disorders: Secondary | ICD-10-CM | POA: Diagnosis not present

## 2022-03-22 DIAGNOSIS — G4733 Obstructive sleep apnea (adult) (pediatric): Secondary | ICD-10-CM | POA: Diagnosis not present

## 2022-03-22 DIAGNOSIS — I10 Essential (primary) hypertension: Secondary | ICD-10-CM | POA: Diagnosis not present

## 2022-03-31 ENCOUNTER — Inpatient Hospital Stay: Payer: Medicaid Other | Admitting: Nurse Practitioner

## 2022-03-31 ENCOUNTER — Encounter: Payer: Self-pay | Admitting: Nurse Practitioner

## 2022-03-31 ENCOUNTER — Inpatient Hospital Stay: Payer: Medicaid Other | Attending: Nurse Practitioner

## 2022-03-31 ENCOUNTER — Telehealth: Payer: Self-pay

## 2022-03-31 VITALS — BP 141/90 | HR 70 | Temp 98.2°F | Resp 18 | Ht 66.0 in | Wt 210.0 lb

## 2022-03-31 DIAGNOSIS — F32A Depression, unspecified: Secondary | ICD-10-CM | POA: Insufficient documentation

## 2022-03-31 DIAGNOSIS — C20 Malignant neoplasm of rectum: Secondary | ICD-10-CM

## 2022-03-31 DIAGNOSIS — G629 Polyneuropathy, unspecified: Secondary | ICD-10-CM | POA: Diagnosis not present

## 2022-03-31 DIAGNOSIS — I10 Essential (primary) hypertension: Secondary | ICD-10-CM | POA: Insufficient documentation

## 2022-03-31 DIAGNOSIS — Z79899 Other long term (current) drug therapy: Secondary | ICD-10-CM | POA: Diagnosis not present

## 2022-03-31 DIAGNOSIS — C7951 Secondary malignant neoplasm of bone: Secondary | ICD-10-CM | POA: Insufficient documentation

## 2022-03-31 DIAGNOSIS — D128 Benign neoplasm of rectum: Secondary | ICD-10-CM | POA: Insufficient documentation

## 2022-03-31 DIAGNOSIS — Z923 Personal history of irradiation: Secondary | ICD-10-CM | POA: Diagnosis not present

## 2022-03-31 LAB — CEA (ACCESS): CEA (CHCC): <1 ng/mL (ref 0.00–5.00)

## 2022-03-31 NOTE — Progress Notes (Signed)
  [place] [facility] [place] OFFICE PROGRESS NOTE   Diagnosis: Rectal cancer  INTERVAL HISTORY:   [name] returns as scheduled.  Other than irregular bowel habits she feels well.  She has periodic incontinence.  She has mild intermittent neuropathy symptoms.  Objective:  Vital signs in last 24 hours:  Blood pressure (!) 141/90, pulse 70, temperature 98.2 F (36.8 C), temperature source Oral, resp. rate 18, height $RemoveBe'5\' 6"'aFxPeqSwc$  (1.676 m), weight 210 lb (95.3 kg), SpO2 100 %.    Lymphatics: No palpable cervical, supraclavicular, axillary or inguinal lymph nodes. Resp: Lungs clear bilaterally. Cardio: Regular rate and rhythm. GI: Abdomen soft and nontender.  No hepatosplenomegaly.  No mass. Vascular: No leg edema.   Lab Results:  Lab Results  Component Value Date   WBC 4.4 02/12/2019   HGB 14.1 02/12/2019   HCT 42.3 02/12/2019   MCV 77.3 (L) 02/12/2019   PLT 215 02/12/2019   NEUTROABS 2.4 11/16/2017    Imaging:  No results found.  Medications: I have reviewed the patient's current medications.  Assessment/Plan: Rectal cancer, clinical stage T3b,N0,M0 colonoscopy 03/06/2017-biopsy of a rectal mass revealed fragments of an adenomatous lesion with high-grade dysplasia, definite submucosa not available to evaluate for invasion Proctoscopy/biopsy 03/23/2017 confirmed a mass at 6-7 centimeters from the anal verge, biopsy revealed polypoid colorectal mucosa with detached fragments of adenomatous mucosa Staging CTs 03/16/2017, anterior rectal mass, no evidence of metastatic disease, no adenopathy, MRI 03/21/2017- T3b,N0 rectal tumor Initiation of radiation and concurrent Xeloda 04/23/2017. Completed 05/30/2017. Laparoscopic low anterior resection and diverting ileostomy 07/26/2017,ypT2,ypN0 tumor with negative surgical margins, normal mismatch repair protein expression Cycle 1 adjuvant Xeloda 08/20/2017 Xeloda placed on hold beginning 08/28/2017 due to bilateral leg  numbness Xeloda discontinued Flexible sigmoidoscopy 10/19/2017- patent end-to-end colocolonic anastomosis characterized by healthy-appearing mucosa.  No specimens collected. Surveillance colonoscopy 07/16/2018- distal rectum colo-colonic anastomosis normal.  Examination of the colon to the cecum otherwise normal.  No polyps or cancers.   Colonoscopy 09/13/2021 polyps removed from the rectum and cecum, tubular adenomas   2.   Hypertension   3.   Depression   4.   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   Bilateral foot/leg numbness and weakness.  MRI lumbar spine 09/05/2017-no evidence of metastatic disease to the lumbosacral spine.  No stenosis or neural compression.  Fatty marrow changes from mid L5 through the sacrum presumably secondary to previous radiation.  Leg strength improved on exam 09/06/2017.  Per patient report numbness improved, still present over the soles of both feet.    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  Neuropathy improved.   6.   Ileostomy reversal 11/28/2017  Disposition: Donna Brandt remains in clinical remission from rectal cancer.  We will follow-up on the CEA from today.  She will return for a CEA and follow-up visit in 6 months.  She plans to contact Dr. Dema Severin as well as GI for any recommendations regarding bowel habits.    Donna Brandt ANP/GNP-BC   03/31/2022  3:05 PM

## 2022-04-04 ENCOUNTER — Encounter: Payer: Self-pay | Admitting: *Deleted

## 2022-04-14 DIAGNOSIS — C20 Malignant neoplasm of rectum: Secondary | ICD-10-CM | POA: Diagnosis not present

## 2022-04-14 DIAGNOSIS — E119 Type 2 diabetes mellitus without complications: Secondary | ICD-10-CM | POA: Diagnosis not present

## 2022-04-14 DIAGNOSIS — E8940 Asymptomatic postprocedural ovarian failure: Secondary | ICD-10-CM | POA: Diagnosis not present

## 2022-04-14 DIAGNOSIS — Z6835 Body mass index (BMI) 35.0-35.9, adult: Secondary | ICD-10-CM | POA: Diagnosis not present

## 2022-04-14 DIAGNOSIS — I10 Essential (primary) hypertension: Secondary | ICD-10-CM | POA: Diagnosis not present

## 2022-04-14 DIAGNOSIS — Z20822 Contact with and (suspected) exposure to covid-19: Secondary | ICD-10-CM | POA: Diagnosis not present

## 2022-04-14 DIAGNOSIS — F418 Other specified anxiety disorders: Secondary | ICD-10-CM | POA: Diagnosis not present

## 2022-04-14 DIAGNOSIS — E7800 Pure hypercholesterolemia, unspecified: Secondary | ICD-10-CM | POA: Diagnosis not present

## 2022-04-14 DIAGNOSIS — E669 Obesity, unspecified: Secondary | ICD-10-CM | POA: Diagnosis not present

## 2022-04-14 DIAGNOSIS — Z79899 Other long term (current) drug therapy: Secondary | ICD-10-CM | POA: Diagnosis not present

## 2022-04-14 DIAGNOSIS — G4733 Obstructive sleep apnea (adult) (pediatric): Secondary | ICD-10-CM | POA: Diagnosis not present

## 2022-04-14 DIAGNOSIS — G43909 Migraine, unspecified, not intractable, without status migrainosus: Secondary | ICD-10-CM | POA: Diagnosis not present

## 2022-04-22 DIAGNOSIS — I10 Essential (primary) hypertension: Secondary | ICD-10-CM | POA: Diagnosis not present

## 2022-04-22 DIAGNOSIS — G4733 Obstructive sleep apnea (adult) (pediatric): Secondary | ICD-10-CM | POA: Diagnosis not present

## 2022-04-27 DIAGNOSIS — I10 Essential (primary) hypertension: Secondary | ICD-10-CM | POA: Diagnosis not present

## 2022-04-27 DIAGNOSIS — Z9882 Breast implant status: Secondary | ICD-10-CM | POA: Diagnosis not present

## 2022-04-27 DIAGNOSIS — Z6834 Body mass index (BMI) 34.0-34.9, adult: Secondary | ICD-10-CM | POA: Diagnosis not present

## 2022-04-27 DIAGNOSIS — G2581 Restless legs syndrome: Secondary | ICD-10-CM | POA: Diagnosis not present

## 2022-04-27 DIAGNOSIS — C20 Malignant neoplasm of rectum: Secondary | ICD-10-CM | POA: Diagnosis not present

## 2022-04-27 DIAGNOSIS — E8940 Asymptomatic postprocedural ovarian failure: Secondary | ICD-10-CM | POA: Diagnosis not present

## 2022-04-27 DIAGNOSIS — G4733 Obstructive sleep apnea (adult) (pediatric): Secondary | ICD-10-CM | POA: Diagnosis not present

## 2022-04-30 DIAGNOSIS — Z20822 Contact with and (suspected) exposure to covid-19: Secondary | ICD-10-CM | POA: Diagnosis not present

## 2022-04-30 DIAGNOSIS — J029 Acute pharyngitis, unspecified: Secondary | ICD-10-CM | POA: Diagnosis not present

## 2022-04-30 DIAGNOSIS — M940 Chondrocostal junction syndrome [Tietze]: Secondary | ICD-10-CM | POA: Diagnosis not present

## 2022-04-30 DIAGNOSIS — R0602 Shortness of breath: Secondary | ICD-10-CM | POA: Diagnosis not present

## 2022-05-08 ENCOUNTER — Ambulatory Visit (INDEPENDENT_AMBULATORY_CARE_PROVIDER_SITE_OTHER)
Admission: RE | Admit: 2022-05-08 | Discharge: 2022-05-08 | Disposition: A | Discharge status: Home / Self Care | Payer: Medicaid Other | Source: Ambulatory Visit | Attending: Nurse Practitioner | Admitting: Nurse Practitioner

## 2022-05-08 ENCOUNTER — Encounter: Payer: Self-pay | Admitting: Nurse Practitioner

## 2022-05-08 ENCOUNTER — Ambulatory Visit: Payer: Medicaid Other | Admitting: Nurse Practitioner

## 2022-05-08 VITALS — BP 110/64 | HR 76 | Ht 66.0 in | Wt 206.4 lb

## 2022-05-08 DIAGNOSIS — K6289 Other specified diseases of anus and rectum: Secondary | ICD-10-CM

## 2022-05-08 DIAGNOSIS — R194 Change in bowel habit: Secondary | ICD-10-CM

## 2022-05-08 DIAGNOSIS — K5900 Constipation, unspecified: Secondary | ICD-10-CM | POA: Diagnosis not present

## 2022-05-08 DIAGNOSIS — R109 Unspecified abdominal pain: Secondary | ICD-10-CM | POA: Diagnosis not present

## 2022-05-08 MED ORDER — NON FORMULARY
1 refills | Status: AC
Start: 2022-05-08 — End: ?

## 2022-05-08 NOTE — Patient Instructions (Addendum)
f you are age 47 or older, your body mass index should be between 23-30. Your Body mass index is 33.31 kg/m. If this is out of the aforementioned range listed, please consider follow up with your Primary Care Provider.  If you are age 70 or younger, your body mass index should be between 19-25. Your Body mass index is 33.31 kg/m. If this is out of the aformentioned range listed, please consider follow up with your Primary Care Provider.   ________________________________________________________  The [facility] providers would like to encourage you to use [facility] to communicate with providers for non-urgent requests or questions.  Due to long hold times on the telephone, sending your provider a message by [facility] may be a faster and more efficient way to get a response.  Please allow 48 business hours for a response.  Please remember that this is for non-urgent requests.  _______________________________________________________   Your provider has requested that you go to the basement level for an xray before leaving today. Press "B" on the elevator.  We will call you once your results come back.   Your provider has prescribed Diltiazem  gel for you. Please follow the directions written on your prescription bottle or given to you specifically by your provider. Since this is a specialty medication and is not readily available at most local pharmacies, we have sent your prescription to:  [facility] information is below: Address: [address] [place], [place] [address]  [phone]  *Please DO NOT go directly from our office to pick up this medication! Give the pharmacy 1 day to process the prescription as this is compounded and takes time to make.   Due to recent changes in healthcare laws, you may see the results of your imaging and laboratory studies on MyChart before your provider has had a chance to review them.  We understand that in some cases there may be results  that are confusing or concerning to you. Not all laboratory results come back in the same time frame and the provider may be waiting for multiple results in order to interpret others.  Please give [place] 48 hours in order for your provider to thoroughly review all the results before contacting the office for clarification of your results.    It was a pleasure to see you today!  Thank you for trusting me with your gastrointestinal care!

## 2022-05-08 NOTE — Progress Notes (Signed)
Chief Complaint:  constipation,diarrhea, rectal pain, rectal bleeding   Assessment &  Plan   # 46 yo female with history of T3b, N0 rectal cancer diagnosed in 2018 s/p chemoradiation followed by [facility]. No recurrent cancer on surveillance colonoscopy in Feb 2023. She did have a couple of small tubular adenomas removed and a 3 year surveillance colonoscopy was recommended.   # Chronically altered bowel habits ( since rectal cancer), increased gas, rectal pain and intermittent rectal bleeding. Suspect she has constipation with some overflow diarrhea. Rectal pain and bleeding probably related to an anal fissure based on exam. Increased flatulence may be related to severe constipation.  KUB to evaluate for constipation. If +, purge bowels and start Amitiza 24 mcg BID.  Topical Diltiazem 5% + Lidocaine. Instructed how to apply just inside anus TID x 6 weeks. Warm tub soaks TID.    HPI   Donna Brandt is a 46 y.o. female known to Dr. Christella Hartigan with a past medical history significant for T3b, NO rectal cancer diagnosed in 2018 s/p chemoradition followed by [facility]. See PMH /PSH for additional history   Interval History:  Donna Brandt was last seen at time of surveillance colonoscopy in Feb 2023. She has been having problems with gas and altered bowel movements since her cancer in 2018. Having severe rectal pain with bowel movements and if is even uncomfortable to sit.   She gets the urge to have a BM but has difficult time evacuating stool from rectum . Stools are not always hard, not particularly large in caliber but still cannot pass them without significant straining. She has intermittent painless rectal bleeding. She has been having back pain. She has tried Dulcolax and smooth move tea but requires so much of it to get a result.  She walks a lot with her job and this gives her the urge to have a BM but can only get a little of stool ( sometimes loose) out at a time and results in multiple trips to bathroom a  day which interferes with work . Her rectum is sore. She complains of excessive gas  Roy has a surveillance colonoscopy in Feb 2023. No recurrent cancer, just a couple of tubular adenomas were removed.   Labs:     Latest Ref Rng & Units 02/12/2019   12:55 PM 11/29/2017    5:00 AM 11/16/2017   11:16 AM  CBC  WBC 4.0 - 10.5 K/uL 4.4  8.1  3.4   Hemoglobin 12.0 - 15.0 g/dL 85.2  79.5  85.7   Hematocrit 36.0 - 46.0 % 42.3  33.8  34.5   Platelets 150 - 400 K/uL 215  198  175        Latest Ref Rng & Units 07/09/2018    1:52 PM 04/10/2018   10:40 AM 11/16/2017   11:16 AM  Hepatic Function  Total Protein 6.1 - 8.1 g/dL  7.7  7.5   Albumin 3.5 - 5.2 g/dL 2.8   4.0   AST 15 - 41 U/L   21   ALT 14 - 54 U/L   23   Alk Phosphatase 38 - 126 U/L   114   Total Bilirubin 0.3 - 1.2 mg/dL   0.4      Past Medical History:  Diagnosis Date   Adjustment disorder with anxious mood 04/08/2019   Anemia    Colon cancer (HCC) 2018   rectal   Complication of anesthesia    during colonoscopy woke up! and  wisdom teeth extraction   HTN (hypertension)    Major depressive disorder, recurrent (HCC)    Migraine    Sickle cell trait ([place])    Sleep apnea     Past Surgical History:  Procedure Laterality Date   ABDOMINAL HYSTERECTOMY     due to uterine fibroids   AUGMENTATION MAMMAPLASTY Bilateral    COLONOSCOPY  07/16/2018   ENDOMETRIAL ABLATION     FLEXIBLE SIGMOIDOSCOPY N/A 04/12/2017   Procedure: FLEXIBLE SIGMOIDOSCOPY;  Surgeon: Milus Banister, MD;  Location: [facility];  Service: Endoscopy;  Laterality: N/A;   FLEXIBLE SIGMOIDOSCOPY N/A 07/18/2017   Procedure: FLEXIBLE SIGMOIDOSCOPY EXAM UNDER ANESTHESIA;  Surgeon: Ileana Roup, MD;  Location: [facility];  Service: General;  Laterality: N/A;   FLEXIBLE SIGMOIDOSCOPY  07/26/2017   Procedure: FLEXIBLE SIGMOIDOSCOPY;  Surgeon: Ileana Roup, MD;  Location: [facility];  Service: General;;   FLEXIBLE SIGMOIDOSCOPY N/A 10/19/2017    Procedure: FLEXIBLE SIGMOIDOSCOPY;  Surgeon: Ileana Roup, MD;  Location: [facility];  Service: General;  Laterality: N/A;   ILEOSTOMY N/A 11/28/2017   Procedure: CLOSURE OF LOOP ILEOSTOMY ERAS PATHWAY;  Surgeon: Ileana Roup, MD;  Location: [facility];  Service: General;  Laterality: N/A;   ILEOSTOMY REVISION     11-28-17   LAPAROSCOPIC LOW ANTERIOR RESECTION N/A 07/26/2017   Procedure: LAPAROSCOPIC VS OPEN  LOW ANTERIOR RESECTION WITH Jette;  Surgeon: Ileana Roup, MD;  Location: [facility];  Service: General;  Laterality: N/A;   PROCTOSCOPY  07/26/2017   Procedure: PROCTOSCOPY;  Surgeon: Ileana Roup, MD;  Location: [facility];  Service: General;;   TONSILLECTOMY     TUBAL LIGATION     WISDOM TOOTH EXTRACTION      Current Medications, Allergies, Family History and Social History were reviewed in [place] record.     Current Outpatient Medications  Medication Sig Dispense Refill   ACETAMINOPHEN-BUTALBITAL 50-325 MG TABS Take 1 tablet by mouth every 6 (six) hours as needed.     amitriptyline (ELAVIL) 25 MG tablet Take 25 mg by mouth at bedtime.     amlodipine-olmesartan (AZOR) 10-20 MG tablet Take 1 tablet by mouth daily.     escitalopram (LEXAPRO) 10 MG tablet Take 10 mg by mouth daily.     EYSUVIS 0.25 % SUSP Apply 1 drop to eye 2 (two) times daily.     RESTASIS 0.05 % ophthalmic emulsion 1 drop 2 (two) times daily.     tiZANidine (ZANAFLEX) 4 MG tablet Take 4 mg by mouth every 8 (eight) hours as needed.     TRULICITY 6.21 HY/8.6VH SOPN Inject into the skin.     Vitamin D, Ergocalciferol, (DRISDOL) 1.25 MG (50000 UNIT) CAPS capsule Take 50,000 Units by mouth once a week.     No current facility-administered medications for this visit.    Review of Systems: No chest pain. No shortness of breath. No urinary complaints.    Physical Exam  Wt Readings from Last 3 Encounters:  05/08/22 206 lb 6.4 oz (93.6 kg)   03/31/22 210 lb (95.3 kg)  09/29/21 211 lb 12.8 oz (96.1 kg)    BP 110/64   Pulse 76   Ht $R'5\' 6"'BD$  (1.676 m)   Wt 206 lb 6.4 oz (93.6 kg)   BMI 33.31 kg/m  Constitutional:  Generally well appearing female in no acute distress. Psychiatric: Pleasant. Normal mood and affect. Behavior is normal. EENT: Pupils normal.  Conjunctivae are normal. No scleral icterus. Neck  supple.  Cardiovascular: Normal rate, regular rhythm. No edema Pulmonary/chest: Effort normal and breath sounds normal. No wheezing, rales or rhonchi. Abdominal: Soft, nondistended, nontender. Bowel sounds active throughout. There are no masses palpable. No hepatomegaly. Rectal: small posterior skin tag / hemorrhoid tag. No obvious fissures. Unsuccessful completion of DRE due to severe discomfort raising concern for posterior midline fissure Neurological: Alert and oriented to person place and time. Skin: Skin is warm and dry. No rashes noted.  Tye Savoy, NP  05/08/2022, 9:22 AM

## 2022-05-08 NOTE — Progress Notes (Signed)
Attending Physician's Attestation   I have reviewed the chart.   I agree with the Advanced Practitioner's note, impression, and recommendations with any updates as below.    Charmin Aguiniga Mansouraty, MD [facility] Office # [phone]  

## 2022-05-09 ENCOUNTER — Other Ambulatory Visit: Payer: Self-pay

## 2022-05-09 MED ORDER — LUBIPROSTONE 24 MCG PO CAPS: 24.0000 ug | ORAL_CAPSULE | Freq: Two times a day (BID) | ORAL | 3 refills | Status: DC

## 2022-05-22 DIAGNOSIS — I10 Essential (primary) hypertension: Secondary | ICD-10-CM | POA: Diagnosis not present

## 2022-05-22 DIAGNOSIS — G4733 Obstructive sleep apnea (adult) (pediatric): Secondary | ICD-10-CM | POA: Diagnosis not present

## 2022-06-22 DIAGNOSIS — G4733 Obstructive sleep apnea (adult) (pediatric): Secondary | ICD-10-CM | POA: Diagnosis not present

## 2022-06-22 DIAGNOSIS — I10 Essential (primary) hypertension: Secondary | ICD-10-CM | POA: Diagnosis not present

## 2022-07-04 DIAGNOSIS — C20 Malignant neoplasm of rectum: Secondary | ICD-10-CM | POA: Diagnosis not present

## 2022-07-04 DIAGNOSIS — G4733 Obstructive sleep apnea (adult) (pediatric): Secondary | ICD-10-CM | POA: Diagnosis not present

## 2022-07-04 DIAGNOSIS — Z6834 Body mass index (BMI) 34.0-34.9, adult: Secondary | ICD-10-CM | POA: Diagnosis not present

## 2022-07-04 DIAGNOSIS — G43909 Migraine, unspecified, not intractable, without status migrainosus: Secondary | ICD-10-CM | POA: Diagnosis not present

## 2022-07-04 DIAGNOSIS — E8940 Asymptomatic postprocedural ovarian failure: Secondary | ICD-10-CM | POA: Diagnosis not present

## 2022-07-04 DIAGNOSIS — Z79899 Other long term (current) drug therapy: Secondary | ICD-10-CM | POA: Diagnosis not present

## 2022-07-04 DIAGNOSIS — K760 Fatty (change of) liver, not elsewhere classified: Secondary | ICD-10-CM | POA: Diagnosis not present

## 2022-07-04 DIAGNOSIS — I10 Essential (primary) hypertension: Secondary | ICD-10-CM | POA: Diagnosis not present

## 2022-07-04 DIAGNOSIS — F418 Other specified anxiety disorders: Secondary | ICD-10-CM | POA: Diagnosis not present

## 2022-07-04 DIAGNOSIS — E119 Type 2 diabetes mellitus without complications: Secondary | ICD-10-CM | POA: Diagnosis not present

## 2022-07-04 DIAGNOSIS — U071 COVID-19: Secondary | ICD-10-CM | POA: Diagnosis not present

## 2022-07-04 DIAGNOSIS — E7800 Pure hypercholesterolemia, unspecified: Secondary | ICD-10-CM | POA: Diagnosis not present

## 2022-07-22 DIAGNOSIS — I10 Essential (primary) hypertension: Secondary | ICD-10-CM | POA: Diagnosis not present

## 2022-07-22 DIAGNOSIS — G4733 Obstructive sleep apnea (adult) (pediatric): Secondary | ICD-10-CM | POA: Diagnosis not present

## 2022-08-04 ENCOUNTER — Other Ambulatory Visit: Payer: Self-pay | Admitting: Family Medicine

## 2022-08-04 DIAGNOSIS — Z1231 Encounter for screening mammogram for malignant neoplasm of breast: Secondary | ICD-10-CM

## 2022-08-08 DIAGNOSIS — Z23 Encounter for immunization: Secondary | ICD-10-CM | POA: Diagnosis not present

## 2022-08-08 DIAGNOSIS — G43909 Migraine, unspecified, not intractable, without status migrainosus: Secondary | ICD-10-CM | POA: Diagnosis not present

## 2022-08-08 DIAGNOSIS — E119 Type 2 diabetes mellitus without complications: Secondary | ICD-10-CM | POA: Diagnosis not present

## 2022-08-08 DIAGNOSIS — I10 Essential (primary) hypertension: Secondary | ICD-10-CM | POA: Diagnosis not present

## 2022-08-08 DIAGNOSIS — Z6833 Body mass index (BMI) 33.0-33.9, adult: Secondary | ICD-10-CM | POA: Diagnosis not present

## 2022-08-08 DIAGNOSIS — Z79899 Other long term (current) drug therapy: Secondary | ICD-10-CM | POA: Diagnosis not present

## 2022-08-08 DIAGNOSIS — Z20822 Contact with and (suspected) exposure to covid-19: Secondary | ICD-10-CM | POA: Diagnosis not present

## 2022-08-08 DIAGNOSIS — E7800 Pure hypercholesterolemia, unspecified: Secondary | ICD-10-CM | POA: Diagnosis not present

## 2022-08-08 DIAGNOSIS — G4733 Obstructive sleep apnea (adult) (pediatric): Secondary | ICD-10-CM | POA: Diagnosis not present

## 2022-08-08 DIAGNOSIS — C20 Malignant neoplasm of rectum: Secondary | ICD-10-CM | POA: Diagnosis not present

## 2022-08-15 NOTE — Telephone Encounter (Signed)
error 

## 2022-08-22 DIAGNOSIS — G4733 Obstructive sleep apnea (adult) (pediatric): Secondary | ICD-10-CM | POA: Diagnosis not present

## 2022-08-22 DIAGNOSIS — I10 Essential (primary) hypertension: Secondary | ICD-10-CM | POA: Diagnosis not present

## 2022-09-12 DIAGNOSIS — G4733 Obstructive sleep apnea (adult) (pediatric): Secondary | ICD-10-CM | POA: Diagnosis not present

## 2022-09-12 DIAGNOSIS — I10 Essential (primary) hypertension: Secondary | ICD-10-CM | POA: Diagnosis not present

## 2022-09-12 DIAGNOSIS — R0602 Shortness of breath: Secondary | ICD-10-CM | POA: Diagnosis not present

## 2022-09-12 DIAGNOSIS — E7800 Pure hypercholesterolemia, unspecified: Secondary | ICD-10-CM | POA: Diagnosis not present

## 2022-09-12 DIAGNOSIS — Z6834 Body mass index (BMI) 34.0-34.9, adult: Secondary | ICD-10-CM | POA: Diagnosis not present

## 2022-09-12 DIAGNOSIS — C20 Malignant neoplasm of rectum: Secondary | ICD-10-CM | POA: Diagnosis not present

## 2022-09-12 DIAGNOSIS — T594X1A Toxic effect of chlorine gas, accidental (unintentional), initial encounter: Secondary | ICD-10-CM | POA: Diagnosis not present

## 2022-09-12 DIAGNOSIS — E119 Type 2 diabetes mellitus without complications: Secondary | ICD-10-CM | POA: Diagnosis not present

## 2022-09-22 DIAGNOSIS — G4733 Obstructive sleep apnea (adult) (pediatric): Secondary | ICD-10-CM | POA: Diagnosis not present

## 2022-09-28 ENCOUNTER — Inpatient Hospital Stay: Payer: Medicaid Other | Admitting: Oncology

## 2022-09-28 ENCOUNTER — Inpatient Hospital Stay: Payer: Medicaid Other | Attending: Oncology

## 2022-09-28 VITALS — BP 108/82 | HR 78 | Temp 98.1°F | Resp 16 | Ht 66.0 in | Wt 193.4 lb

## 2022-09-28 DIAGNOSIS — C7951 Secondary malignant neoplasm of bone: Secondary | ICD-10-CM | POA: Diagnosis not present

## 2022-09-28 DIAGNOSIS — F32A Depression, unspecified: Secondary | ICD-10-CM | POA: Insufficient documentation

## 2022-09-28 DIAGNOSIS — L271 Localized skin eruption due to drugs and medicaments taken internally: Secondary | ICD-10-CM | POA: Insufficient documentation

## 2022-09-28 DIAGNOSIS — T451X5A Adverse effect of antineoplastic and immunosuppressive drugs, initial encounter: Secondary | ICD-10-CM | POA: Insufficient documentation

## 2022-09-28 DIAGNOSIS — K5900 Constipation, unspecified: Secondary | ICD-10-CM | POA: Diagnosis not present

## 2022-09-28 DIAGNOSIS — C20 Malignant neoplasm of rectum: Secondary | ICD-10-CM

## 2022-09-28 DIAGNOSIS — Z923 Personal history of irradiation: Secondary | ICD-10-CM | POA: Insufficient documentation

## 2022-09-28 DIAGNOSIS — G629 Polyneuropathy, unspecified: Secondary | ICD-10-CM | POA: Diagnosis not present

## 2022-09-28 DIAGNOSIS — Z79899 Other long term (current) drug therapy: Secondary | ICD-10-CM | POA: Insufficient documentation

## 2022-09-28 DIAGNOSIS — I10 Essential (primary) hypertension: Secondary | ICD-10-CM | POA: Insufficient documentation

## 2022-09-28 DIAGNOSIS — K6289 Other specified diseases of anus and rectum: Secondary | ICD-10-CM | POA: Diagnosis not present

## 2022-09-28 LAB — CEA (ACCESS): CEA (CHCC): <1 ng/mL (ref 0.00–5.00)

## 2022-09-28 NOTE — Progress Notes (Signed)
Donna Brandt OFFICE PROGRESS NOTE   Diagnosis: Rectal cancer  INTERVAL HISTORY:   Donna Brandt returns as scheduled.  She reports a good appetite, but she alters her food intake due to frequent bowel movements after eating.  The need to have bowel movements interferes with her ability to work and go outside of the home.  When she becomes constipated she develops rectal pain and a "tear "approximately each month.  She saw gastroenterology and was prescribed diltiazem cream, but this did not help.  Objective:  Vital signs in last 24 hours:  Blood pressure 108/82, pulse 78, temperature 98.1 F (36.7 C), temperature source Oral, resp. rate 16, height 5' 6"$  (1.676 m), weight 193 lb 6.4 oz (87.7 kg), SpO2 100 %.    Lymphatics: No cervical, supraclavicular, axillary, or inguinal nodes Resp: Lungs clear bilaterally Cardio: Regular rate and rhythm GI: No hepatosplenomegaly, no mass, nontender Vascular: No leg edema    Lab Results:  Lab Results  Component Value Date   WBC 4.4 02/12/2019   HGB 14.1 02/12/2019   HCT 42.3 02/12/2019   MCV 77.3 (L) 02/12/2019   PLT 215 02/12/2019   NEUTROABS 2.4 11/16/2017    CMP  Lab Results  Component Value Date   NA 139 02/12/2019   K 4.0 02/12/2019   CL 103 02/12/2019   CO2 24 02/12/2019   GLUCOSE 93 02/12/2019   BUN 8 02/12/2019   CREATININE 0.56 02/12/2019   CALCIUM 10.6 (H) 02/12/2019   PROT 7.7 04/10/2018   ALBUMIN 2.8 (L) 07/09/2018   AST 21 11/16/2017   ALT 23 11/16/2017   ALKPHOS 114 11/16/2017   BILITOT 0.4 11/16/2017   GFRNONAA >60 02/12/2019   GFRAA >60 02/12/2019    Lab Results  Component Value Date   CEA1 <1.00 03/25/2021   CEA <1.00 09/28/2022     Medications: I have reviewed the patient's current medications.   Assessment/Plan: Rectal cancer, clinical stage T3b,N0,M0 colonoscopy 03/06/2017-biopsy of a rectal mass revealed fragments of an adenomatous lesion with high-grade dysplasia, definite  submucosa not available to evaluate for invasion Proctoscopy/biopsy 03/23/2017 confirmed a mass at 6-7 centimeters from the anal verge, biopsy revealed polypoid colorectal mucosa with detached fragments of adenomatous mucosa Staging CTs 03/16/2017, anterior rectal mass, no evidence of metastatic disease, no adenopathy, MRI 03/21/2017- T3b,N0 rectal tumor Initiation of radiation and concurrent Xeloda 04/23/2017. Completed 05/30/2017. Laparoscopic low anterior resection and diverting ileostomy 07/26/2017,ypT2,ypN0 tumor with negative surgical margins, normal mismatch repair protein expression Cycle 1 adjuvant Xeloda 08/20/2017 Xeloda placed on hold beginning 08/28/2017 due to bilateral leg numbness Xeloda discontinued Flexible sigmoidoscopy 10/19/2017- patent end-to-end colocolonic anastomosis characterized by healthy-appearing mucosa.  No specimens collected. Surveillance colonoscopy 07/16/2018- distal rectum colo-colonic anastomosis normal.  Examination of the colon to the cecum otherwise normal.  No polyps or cancers.   Colonoscopy 09/13/2021 polyps removed from the rectum and cecum, tubular adenomas   2.   Hypertension   3.   Depression   4.   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   Bilateral foot/leg numbness and weakness.  MRI lumbar spine 09/05/2017-no evidence of metastatic disease to the lumbosacral spine.  No stenosis or neural compression.  Fatty marrow changes from mid L5 through the sacrum presumably secondary to previous radiation.  Leg strength improved on exam 09/06/2017.  Per patient report numbness improved, still present over the soles of both feet.    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  Neuropathy improved.  6.   Ileostomy reversal 11/28/2017  7.   Irregular bowel habits-rectal urgency, pain, and constipation    Disposition: Donna Brandt is in clinical remission from rectal cancer.  She would like to continue follow-up at the  Cancer center.  She will return for an office visit in 6 months.  She last had a colonoscopy in February 2023.  She will be scheduled for a colonoscopy in February 2026.  She continues to have irregular bowel habits.  This interferes with her quality of life.  She is willing to consider a colostomy.  We discussed the possibility of irrigation of a colostomy.  She would like to see Dr. Dema Severin to discuss dietary and medical options for helping the irregular bowel habits.  She will also discuss a colostomy with Dr. Dema Severin.     Betsy Coder, MD  09/28/2022  3:58 PM

## 2022-09-29 ENCOUNTER — Ambulatory Visit
Admission: RE | Admit: 2022-09-29 | Discharge: 2022-09-29 | Disposition: A | Discharge status: Home / Self Care | Payer: Medicaid Other | Source: Ambulatory Visit | Attending: Family Medicine | Admitting: Family Medicine

## 2022-09-29 DIAGNOSIS — Z1231 Encounter for screening mammogram for malignant neoplasm of breast: Secondary | ICD-10-CM | POA: Diagnosis not present

## 2022-10-09 DIAGNOSIS — K600 Acute anal fissure: Secondary | ICD-10-CM | POA: Diagnosis not present

## 2022-10-09 DIAGNOSIS — M6289 Other specified disorders of muscle: Secondary | ICD-10-CM | POA: Diagnosis not present

## 2022-10-09 DIAGNOSIS — Z85048 Personal history of other malignant neoplasm of rectum, rectosigmoid junction, and anus: Secondary | ICD-10-CM | POA: Diagnosis not present

## 2022-10-09 DIAGNOSIS — R198 Other specified symptoms and signs involving the digestive system and abdomen: Secondary | ICD-10-CM | POA: Diagnosis not present

## 2022-11-10 ENCOUNTER — Other Ambulatory Visit (HOSPITAL_COMMUNITY): Payer: Self-pay

## 2022-11-10 ENCOUNTER — Other Ambulatory Visit (HOSPITAL_BASED_OUTPATIENT_CLINIC_OR_DEPARTMENT_OTHER): Payer: Self-pay

## 2022-11-10 MED ORDER — TRULICITY 1.5 MG/0.5ML ~~LOC~~ SOAJ
1.5000 mg | SUBCUTANEOUS | 0 refills | Status: DC
  Filled 2022-11-11 (×2): qty 2, 28d supply, fill #0

## 2022-11-11 ENCOUNTER — Other Ambulatory Visit (HOSPITAL_BASED_OUTPATIENT_CLINIC_OR_DEPARTMENT_OTHER): Payer: Self-pay

## 2022-11-13 ENCOUNTER — Other Ambulatory Visit (HOSPITAL_BASED_OUTPATIENT_CLINIC_OR_DEPARTMENT_OTHER): Payer: Self-pay

## 2022-11-13 DIAGNOSIS — H5213 Myopia, bilateral: Secondary | ICD-10-CM | POA: Diagnosis not present

## 2022-11-14 ENCOUNTER — Other Ambulatory Visit (HOSPITAL_BASED_OUTPATIENT_CLINIC_OR_DEPARTMENT_OTHER): Payer: Self-pay

## 2022-11-23 ENCOUNTER — Ambulatory Visit: Payer: Medicaid Other | Admitting: Physical Therapy

## 2022-11-27 ENCOUNTER — Ambulatory Visit: Payer: Medicaid Other | Attending: Surgery | Admitting: Physical Therapy

## 2022-11-27 ENCOUNTER — Other Ambulatory Visit: Payer: Self-pay

## 2022-11-27 ENCOUNTER — Encounter: Payer: Self-pay | Admitting: Physical Therapy

## 2022-11-27 ENCOUNTER — Ambulatory Visit: Payer: Medicaid Other | Admitting: Physical Therapy

## 2022-11-27 DIAGNOSIS — R102 Pelvic and perineal pain: Secondary | ICD-10-CM | POA: Insufficient documentation

## 2022-11-27 DIAGNOSIS — R252 Cramp and spasm: Secondary | ICD-10-CM | POA: Insufficient documentation

## 2022-11-27 DIAGNOSIS — M6289 Other specified disorders of muscle: Secondary | ICD-10-CM | POA: Insufficient documentation

## 2022-11-27 NOTE — Therapy (Signed)
OUTPATIENT PHYSICAL THERAPY FEMALE PELVIC EVALUATION   Patient Name: Donna Brandt MRN: [id number] DOB:14-Oct-1975, 47 y.o., female Today's Date: 11/27/2022  END OF SESSION:  PT End of Session - 11/27/22 1419     Visit Number 1    Date for PT Re-Evaluation 02/19/23    Authorization Type [employer]    PT Start Time 1415    PT Stop Time 1445    PT Time Calculation (min) 30 min    Activity Tolerance Patient tolerated treatment well    Behavior During Therapy [facility] for tasks assessed/performed             Past Medical History:  Diagnosis Date   Adjustment disorder with anxious mood 04/08/2019   Anemia    Colon cancer 2018   rectal   Complication of anesthesia    during colonoscopy woke up! and wisdom teeth extraction   HTN (hypertension)    Major depressive disorder, recurrent    Migraine    Sickle cell trait    Sleep apnea    Past Surgical History:  Procedure Laterality Date   ABDOMINAL HYSTERECTOMY     due to uterine fibroids   AUGMENTATION MAMMAPLASTY Bilateral    COLONOSCOPY  07/16/2018   ENDOMETRIAL ABLATION     FLEXIBLE SIGMOIDOSCOPY N/A 04/12/2017   Procedure: FLEXIBLE SIGMOIDOSCOPY;  Surgeon: Rachael Fee, MD;  Location: [facility];  Service: Endoscopy;  Laterality: N/A;   FLEXIBLE SIGMOIDOSCOPY N/A 07/18/2017   Procedure: FLEXIBLE SIGMOIDOSCOPY EXAM UNDER ANESTHESIA;  Surgeon: Andria Meuse, MD;  Location: [facility];  Service: General;  Laterality: N/A;   FLEXIBLE SIGMOIDOSCOPY  07/26/2017   Procedure: FLEXIBLE SIGMOIDOSCOPY;  Surgeon: Andria Meuse, MD;  Location: [facility];  Service: General;;   FLEXIBLE SIGMOIDOSCOPY N/A 10/19/2017   Procedure: FLEXIBLE SIGMOIDOSCOPY;  Surgeon: Andria Meuse, MD;  Location: [facility];  Service: General;  Laterality: N/A;   ILEOSTOMY N/A 11/28/2017   Procedure: CLOSURE OF LOOP ILEOSTOMY ERAS PATHWAY;  Surgeon: Andria Meuse, MD;  Location: [facility];  Service: General;  Laterality: N/A;    ILEOSTOMY REVISION     11-28-17   LAPAROSCOPIC LOW ANTERIOR RESECTION N/A 07/26/2017   Procedure: LAPAROSCOPIC VS OPEN  LOW ANTERIOR RESECTION WITH DIVERITING LOOP ILEOSTOMY;  Surgeon: Andria Meuse, MD;  Location: [facility];  Service: General;  Laterality: N/A;   PROCTOSCOPY  07/26/2017   Procedure: PROCTOSCOPY;  Surgeon: Andria Meuse, MD;  Location: [facility];  Service: General;;   TONSILLECTOMY     TUBAL LIGATION     WISDOM TOOTH EXTRACTION     Patient Active Problem List   Diagnosis Date Noted   Adjustment disorder with anxious mood 04/08/2019   Major depressive disorder, recurrent    Exertional dyspnea 03/27/2019   Chest pain 02/12/2019   HTN (hypertension)    Rectal cancer 07/26/2017   Adenocarcinoma of rectum 04/10/2017    PCP: Caffie Damme, MD  REFERRING PROVIDER: Andria Meuse, MD   REFERRING DIAG:  M62.89 (ICD-10-CM) - Other specified disorders of muscle  R19.8 (ICD-10-CM) - Other specified symptoms and signs involving the digestive system and abdomen  Z85.048 (ICD-10-CM) - Personal history of other malignant neoplasm of rectum, rectosigmoid junction, and anus    THERAPY DIAG:  Cramp and spasm  Pelvic pain  Pelvic floor dysfunction in female  Rationale for Evaluation and Treatment: Rehabilitation  ONSET DATE: 11/28/2017  SUBJECTIVE:  SUBJECTIVE STATEMENT: frequent bowel movements after eating and had to alter her diet. The need to have bowel movements interfered with her ability to work and go outside of her home. When she develops constipation she develops rectal pain and a "tear" or sharp pain approximately monthly. She has been prescribed diltiazem but this had not helped. she may have some component of dyssynergy defecation/nonrelaxation that has been acquired  which would not be unusual in this setting. She also has an acute appearing anal fissure . Patient tries not to eat to reduce the number of bowel movements and to adjust with work and going out.  Fluid intake: Yes: water    PAIN:  Are you having pain? Yes NPRS scale: 8/10 Pain location:  rectal   Pain type: tearing of the tissue pain, sometimes has some blood Pain description: intermittent   Aggravating factors: bowel movement Relieving factors: complete bowel movement  PRECAUTIONS: Other: rectal cancer  WEIGHT BEARING RESTRICTIONS: No  FALLS:  Has patient fallen in last 6 months? 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 Completed 05/30/2017; hysterectomy   BOWEL MOVEMENT: Pain with bowel movement: Yes Type of bowel movement:Type (Bristol Stool Scale) type 6 mostly and type 2 short pieces, Frequency every other day, Strain Yes, and Splinting no Fully empty rectum: No, needs to go multiple times and feels like the stool is still there, sitting 30 or more minutes then will get the neuropathy Leakage: Yes: 3 times full blown accident per month; stool leakage with a little bit 5 times per month Pads: No, will use an Adult diaper when happens Fiber supplement: No, MD said no fiber supplement  URINATION: Pain with urination: No Fully empty bladder: Yes:   Stream: Strong Urgency: No Frequency: every couple of hours Leakage:  none Pads: No  INTERCOURSE: reduction of moisture, fear of bowel movement Pain with intercourse:  see above Ability to have vaginal penetration:  Yes:   Climax: sometimes Marinoff Scale:  1/3  PREGNANCY: Vaginal deliveries 3 Tearing Yes: first child  PROLAPSE: None   OBJECTIVE:   DIAGNOSTIC FINDINGS:  none  PATIENT SURVEYS:  CRAIQ-7:57  COGNITION: Overall cognitive status: Within functional limits for tasks assessed     SENSATION: Light touch: Appears intact Proprioception: Appears intact   POSTURE: No Significant postural limitations  PELVIC ALIGNMENT:  LUMBARAROM/PROM: Lumbar ROM is full   LOWER EXTREMITY ROM: bilateral hip ROM is within normal limits   LOWER EXTREMITY MMT:  MMT Right eval Left eval  Hip extension 4/5 4/5  Hip abduction 4/5 4/5  Hip adduction 4/5 4/5   PALPATION:   General  scar from surgery is restricted                External Perineal Exam tenderness located in the external anal sphincter, along the perineal body, levator ani externally                             Internal Pelvic Floor therapist is not able to place her finger more than 1/2 inch due to restrictions and pain. Not able to test the patient strength due to the restrictions  Patient  confirms identification and approves PT to assess internal pelvic floor and treatment Yes  PELVIC MMT:   MMT eval  Vaginal   Internal Anal Sphincter   External Anal Sphincter   Puborectalis   Diastasis Recti   (Blank rows = not tested)        TONE: Increased   PROLAPSE: Not assessed  TODAY'S TREATMENT:                                                                                                                              DATE: 11/27/22  EVAL see below, eval was completed   PATIENT EDUCATION:  Education details: educated patient on rectal dilators for the future, educated patient on how to massage the perineal body and outside of the rectum to reduce pain and improve relaxation of the muscles Person educated: Patient Education method: Explanation Education comprehension: verbalized understanding  HOME EXERCISE PROGRAM: See  above.  ASSESSMENT:  CLINICAL IMPRESSION: Patient is a 47 y.o. female who was seen today for physical therapy evaluation and treatment for pelvic pain and fecal leakage. Patient has a history of rectal cancer 03/23/2018, LAR with ileostomy 07/26/2017,  ileostomy reversal 11/28/2017 and had chemotherapy with radiation. Patient reports rectal pain at level 8/10 with bowel movements. She will have Type 6 mostly but at times type 2 that is short. She will have multiple bowel movements and not feeling she has fully emptied her rectum. She will sit for 30 minutes or longer then she will have neuropathy afterwards. Patient has  3 times full blown accident per month and  stool leakage with a little bit 5 times per month. She will where a pull up when she is having full fecal leakage. She restricts all her activities due to having to be in the commode. She will avoid eating at times due to her schedule and not being able to use the commode. Abdominal scars are restricted. She has difficulty with contracting the abdomen. Therapist is not able to place her finger more than 1/2 inch due to restrictions and pain. Her external anal sphincter is open and not able to contract. She has tenderness located on the levator ani. She has decreased bilateral hip strength. Patient will benefit from skilled therapy to reduce pain, improve pelvic floor tissue mobility and improve pelvic floor coordination to relax when having a bowel movement.   OBJECTIVE IMPAIRMENTS: decreased activity tolerance, decreased coordination, decreased endurance, decreased mobility, decreased strength, increased fascial restrictions, increased muscle spasms, impaired tone, and pain.   ACTIVITY LIMITATIONS: bending, sitting, standing, squatting, continence, locomotion level, and caring for others  PARTICIPATION LIMITATIONS: meal prep, cleaning, laundry, interpersonal relationship, shopping, community activity, and occupation  PERSONAL FACTORS: Age, Time  since onset of injury/illness/exacerbation, and 3+ comorbidities: rectal cancer 03/23/2018; LAR with ileostomy 07/26/2017; ileostomy reversal 11/28/2017; Initiation of radiation and concurrent Xeloda 04/23/2017. Completed 05/30/2017; hysterectomy  are also affecting patient's functional outcome.   REHAB POTENTIAL: Good  CLINICAL DECISION  MAKING: Evolving/moderate complexity  EVALUATION COMPLEXITY: Moderate   GOALS: Goals reviewed with patient? Yes  SHORT TERM GOALS: Target date: 12/19/22  Patient independent with manual work to the anal sphincter and pelvic floor to reduce tension.  Baseline:Not educated yet Goal status: INITIAL  2.  Patent is independent with hip stretches to lengthen the pelvic floor.  Baseline: Not educated yet Goal status: INITIAL  3.  Patient educated on abdominal massage to improve toileting.  Baseline: Not educated yet Goal status: INITIAL  4.  Patient is able to relax her pelvic floor to place the smallest dilator into the rectum to expand the tissue.  Baseline: therapist not able to place her index finger into the rectum.  Goal status: INITIAL   LONG TERM GOALS: Target date: 02/18/23  Patient independent with advanced HEP for core strength and improvement of coordination Baseline: Not educated  Goal status: INITIAL  2.  Patient is able to relax her pelvic floor to place the largest dilator into the rectum with minimal to no pain.  Baseline: therapist not able to place her index finger into the rectum Goal status: INITIAL  3.  Patient is able to relax the pelvic floor to push stool out with >/= 75% greater ease and pain decreased </= 2-3/10.  Baseline: pain level is 10/10 and she gets fissures due to not able to relax the pelvic floor.  Goal status: INITIAL  4.  Patient is able to go to the bathroom 3 or less times per day to empty her rectum due to improved pelvic floor coordination and less pain.  Baseline: will go for several hours having a bowel  movement.  Goal status: INITIAL  5.  CRAIQ-7 is </= 20 due to reduction of pain and less frustration.  Baseline: CRAIQ-7:57 Goal status: INITIAL  6.  Patient fecal leakage decreased >/= 75% so she does not have to wear adult diapers.  Baseline: she has 2 full stool emptying per month requiring her to wear adult diapers Goal status: INITIAL  PLAN:  PT FREQUENCY: 1-2x/week  PT DURATION: 12 weeks  PLANNED INTERVENTIONS: Therapeutic exercises, Therapeutic activity, Neuromuscular re-education, Patient/Family education, Joint mobilization, Dry Needling, Electrical stimulation, scar mobilization, Taping, Biofeedback, and Manual therapy  PLAN FOR NEXT SESSION: manual work to the abdomen; manual work to the pelvic floor; hip stretches, diaphragmatic breathing, scar massag   Eulis Foster, PT 11/28/22 8:19 AM

## 2022-11-28 IMAGING — US US AXILLARY RIGHT
1 series · 6 of 6 positions shown · non-contrast
Comparison: Previous exams including RIGHT axillary ultrasound
dated 09/27/2021.

CLINICAL DATA: Follow-up for an enlarged lymph node in the RIGHT
axilla.

EXAM:
ULTRASOUND OF THE RIGHT AXILLA

[Series 1: us axillary right · 0.08mm/px · 6 of 6 slices shown]
[im 1/6]
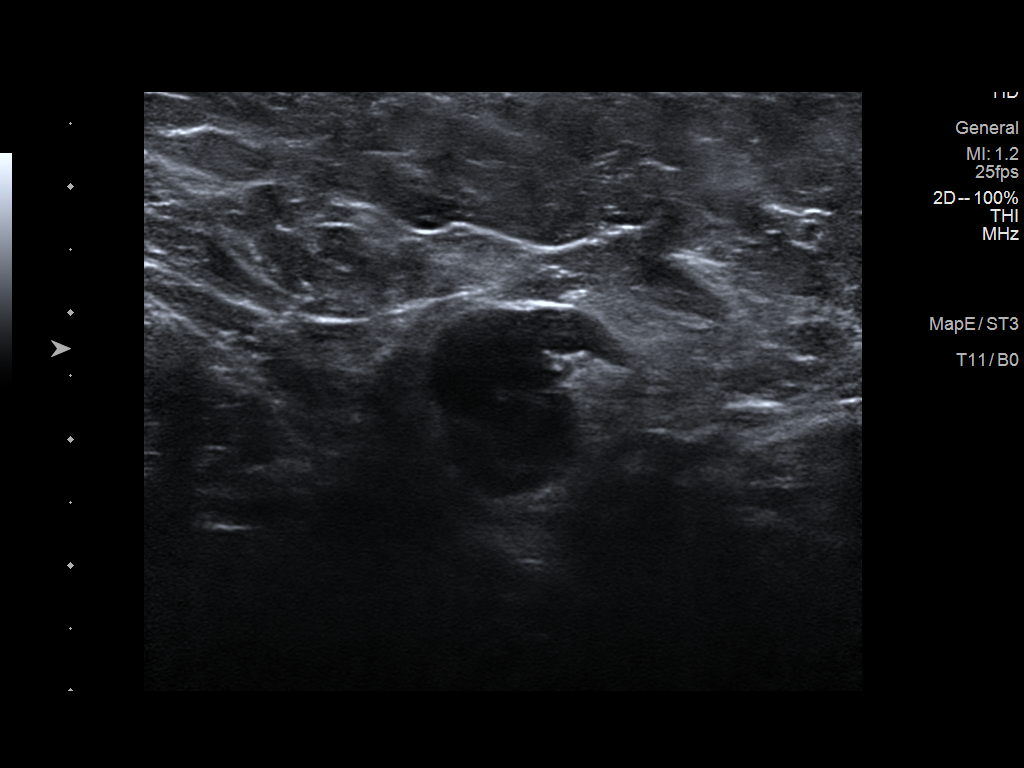
[im 2/6]
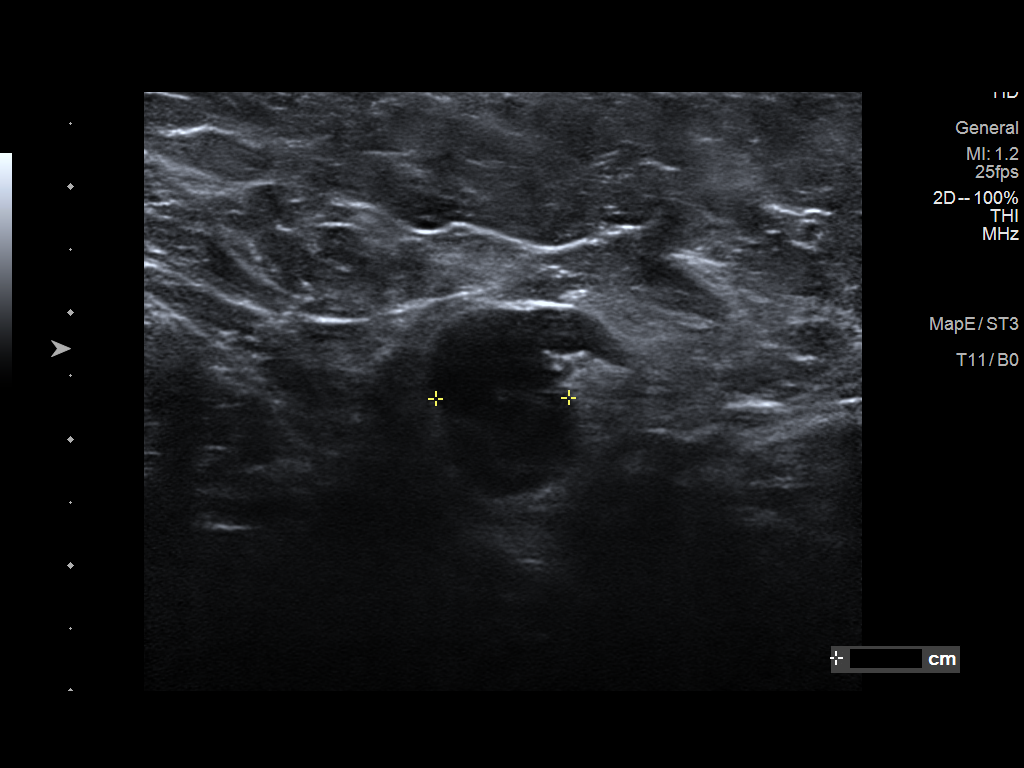
[im 3/6]
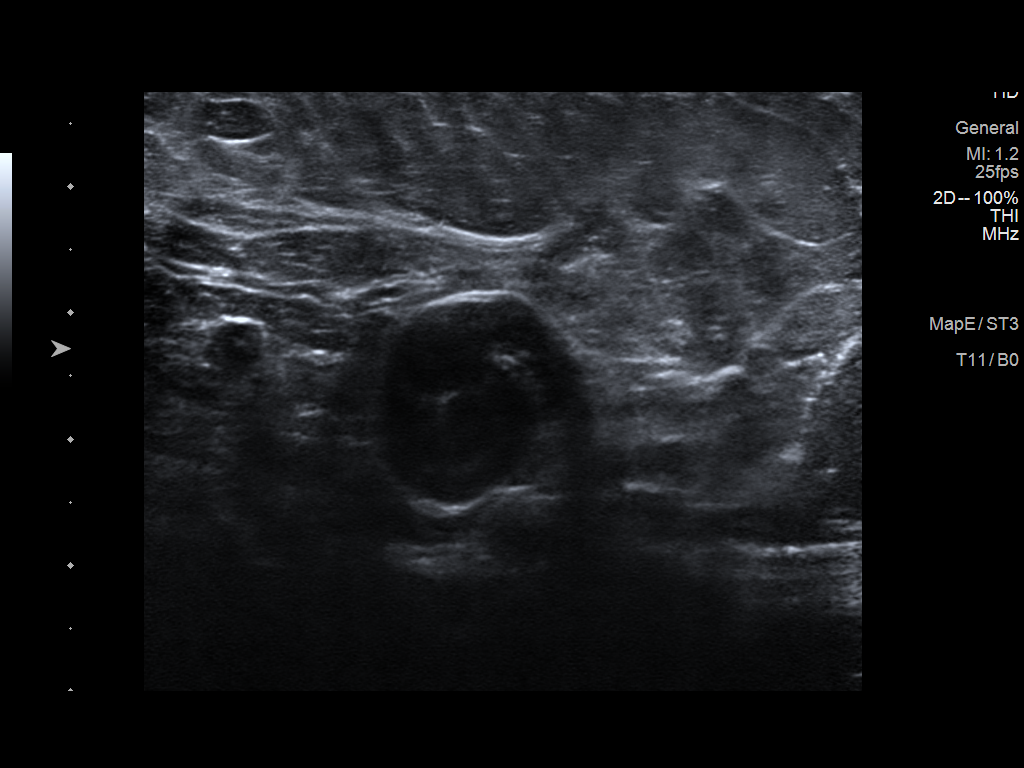
[im 4/6]
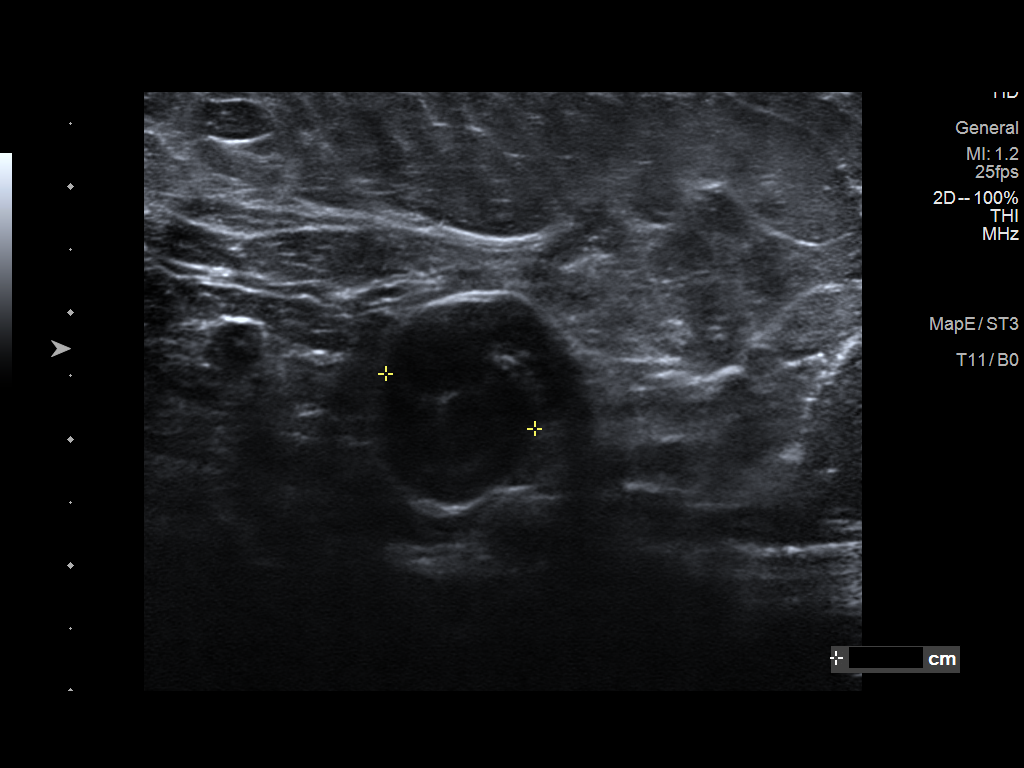
[im 5/6]
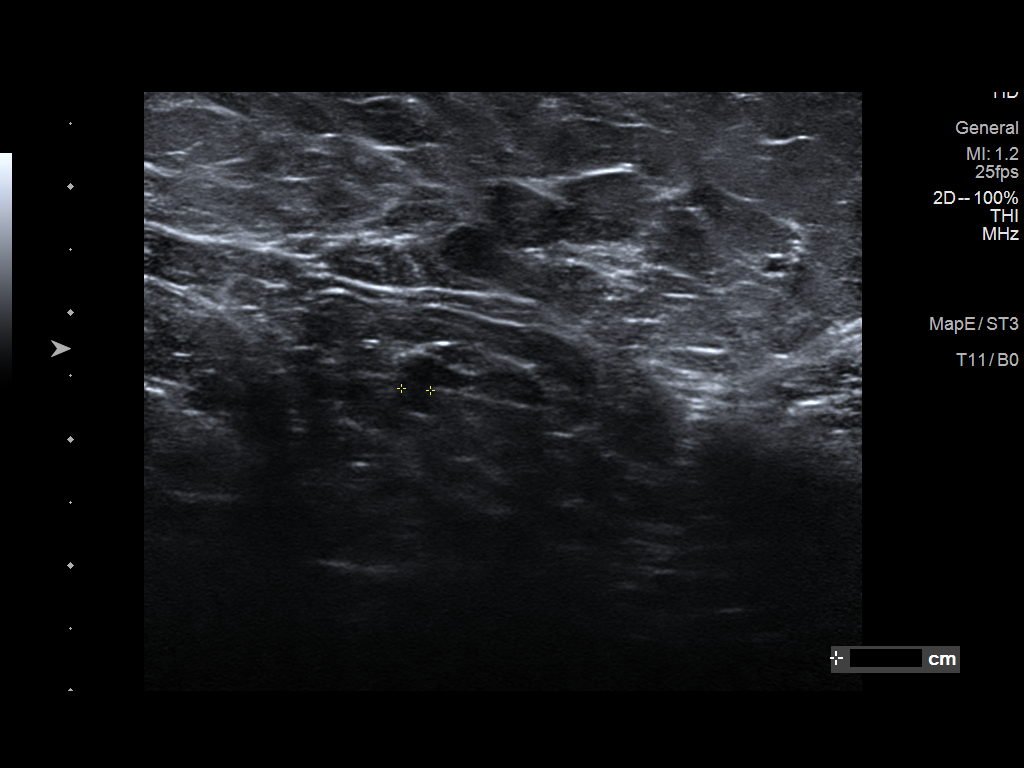
[im 6/6]
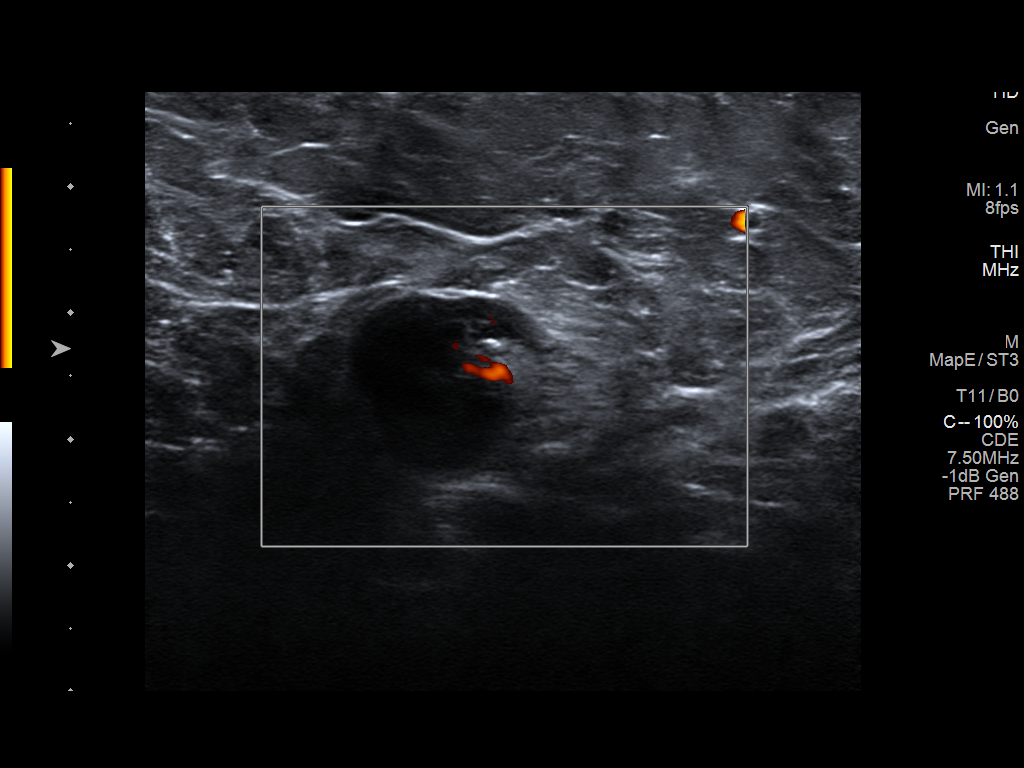

[6 of 6 positions shown; findings below may reference images not displayed]

FINDINGS: Ultrasound is performed, again showing an enlarged/morphologically
abnormal lymph node in the RIGHT axilla with asymmetric cortex
thickening.
IMPRESSION: Single enlarged/morphologically abnormal lymph node in the RIGHT
axilla. Ultrasound-guided biopsy is recommended exclude malignancy.

RECOMMENDATION:
Ultrasound-guided biopsy for the enlarged/morphologically abnormal
lymph node in the RIGHT axilla.

Ultrasound-guided biopsy will be scheduled at patient's convenience.

I have discussed the findings and recommendations with the patient.
If applicable, a reminder letter will be sent to the patient
regarding the next appointment.

BI-RADS CATEGORY  4: Suspicious.

## 2022-12-03 DIAGNOSIS — Z6833 Body mass index (BMI) 33.0-33.9, adult: Secondary | ICD-10-CM | POA: Diagnosis not present

## 2022-12-03 DIAGNOSIS — E559 Vitamin D deficiency, unspecified: Secondary | ICD-10-CM | POA: Diagnosis not present

## 2022-12-03 DIAGNOSIS — E7800 Pure hypercholesterolemia, unspecified: Secondary | ICD-10-CM | POA: Diagnosis not present

## 2022-12-03 DIAGNOSIS — C20 Malignant neoplasm of rectum: Secondary | ICD-10-CM | POA: Diagnosis not present

## 2022-12-03 DIAGNOSIS — G4733 Obstructive sleep apnea (adult) (pediatric): Secondary | ICD-10-CM | POA: Diagnosis not present

## 2022-12-03 DIAGNOSIS — I10 Essential (primary) hypertension: Secondary | ICD-10-CM | POA: Diagnosis not present

## 2022-12-03 DIAGNOSIS — E119 Type 2 diabetes mellitus without complications: Secondary | ICD-10-CM | POA: Diagnosis not present

## 2022-12-03 DIAGNOSIS — Z79899 Other long term (current) drug therapy: Secondary | ICD-10-CM | POA: Diagnosis not present

## 2022-12-03 DIAGNOSIS — G43909 Migraine, unspecified, not intractable, without status migrainosus: Secondary | ICD-10-CM | POA: Diagnosis not present

## 2022-12-03 DIAGNOSIS — Z20822 Contact with and (suspected) exposure to covid-19: Secondary | ICD-10-CM | POA: Diagnosis not present

## 2022-12-06 ENCOUNTER — Encounter: Payer: Self-pay | Admitting: Physical Therapy

## 2022-12-06 ENCOUNTER — Ambulatory Visit: Payer: Medicaid Other | Attending: Surgery | Admitting: Physical Therapy

## 2022-12-06 DIAGNOSIS — R279 Unspecified lack of coordination: Secondary | ICD-10-CM | POA: Diagnosis not present

## 2022-12-06 DIAGNOSIS — R102 Pelvic and perineal pain: Secondary | ICD-10-CM | POA: Diagnosis not present

## 2022-12-06 DIAGNOSIS — M6289 Other specified disorders of muscle: Secondary | ICD-10-CM | POA: Insufficient documentation

## 2022-12-06 DIAGNOSIS — R252 Cramp and spasm: Secondary | ICD-10-CM | POA: Diagnosis not present

## 2022-12-06 NOTE — Patient Instructions (Signed)
About Abdominal Massage  Abdominal massage, also called external colon massage, is a self-treatment circular massage technique that can reduce and eliminate gas and ease constipation. The colon naturally contracts in waves in a clockwise direction starting from inside the right hip, moving up toward the ribs, across the belly, and down inside the left hip.  When you perform circular abdominal massage, you help stimulate your colon's normal wave pattern of movement called peristalsis.  It is most beneficial when done after eating.  Positioning You can practice abdominal massage with oil while lying down, or in the shower with soap.  Some people find that it is just as effective to do the massage through clothing while sitting or standing.  How to Massage Start by placing your finger tips or knuckles on your right side, just inside your hip bone.  Make small circular movements while you move upward toward your rib cage.   Once you reach the bottom right side of your rib cage, take your circular movements across to the left side of the bottom of your rib cage.  Next, move downward until you reach the inside of your left hip bone.  This is the path your feces travel in your colon. Continue to perform your abdominal massage in this pattern for 10 minutes each day.     You can apply as much pressure as is comfortable in your massage.  Start gently and build pressure as you continue to practice.  Notice any areas of pain as you massage; areas of slight pain may be relieved as you massage, but if you have areas of significant or intense pain, consult with your healthcare provider.  Other Considerations General physical activity including bending and stretching can have a beneficial massage-like effect on the colon.  Deep breathing can also stimulate the colon because breathing deeply activates the same nervous system that supplies the colon.   Abdominal massage should always be used in combination with a  bowel-conscious diet that is high in the proper type of fiber for you, fluids (primarily water), and a regular exercise program.  [facility] [address], [address] [place], [place] [address] Phone # [phone] Fax [phone]  

## 2022-12-06 NOTE — Therapy (Signed)
OUTPATIENT PHYSICAL THERAPY TREATMENT NOTE   Patient Name: Donna Brandt MRN: [id number] DOB:12-07-1975, 47 y.o., female Today's Date: 12/06/2022  PCP: Caffie Damme, MD  REFERRING PROVIDER: Andria Meuse, MD   END OF SESSION:   PT End of Session - 12/06/22 0931     Visit Number 2    Date for PT Re-Evaluation 02/19/23    Authorization Type [employer]    Authorization Time Period 4/22-7/20    Authorization - Visit Number 1    Authorization - Number of Visits 11    PT Start Time 0930    PT Stop Time 1010    PT Time Calculation (min) 40 min    Activity Tolerance Patient tolerated treatment well    Behavior During Therapy [facility] for tasks assessed/performed             Past Medical History:  Diagnosis Date   Adjustment disorder with anxious mood 04/08/2019   Anemia    Colon cancer (HCC) 2018   rectal   Complication of anesthesia    during colonoscopy woke up! and wisdom teeth extraction   HTN (hypertension)    Major depressive disorder, recurrent (HCC)    Migraine    Sickle cell trait (HCC)    Sleep apnea    Past Surgical History:  Procedure Laterality Date   ABDOMINAL HYSTERECTOMY     due to uterine fibroids   AUGMENTATION MAMMAPLASTY Bilateral    COLONOSCOPY  07/16/2018   ENDOMETRIAL ABLATION     FLEXIBLE SIGMOIDOSCOPY N/A 04/12/2017   Procedure: FLEXIBLE SIGMOIDOSCOPY;  Surgeon: Rachael Fee, MD;  Location: [facility];  Service: Endoscopy;  Laterality: N/A;   FLEXIBLE SIGMOIDOSCOPY N/A 07/18/2017   Procedure: FLEXIBLE SIGMOIDOSCOPY EXAM UNDER ANESTHESIA;  Surgeon: Andria Meuse, MD;  Location: [facility];  Service: General;  Laterality: N/A;   FLEXIBLE SIGMOIDOSCOPY  07/26/2017   Procedure: FLEXIBLE SIGMOIDOSCOPY;  Surgeon: Andria Meuse, MD;  Location: [facility];  Service: General;;   FLEXIBLE SIGMOIDOSCOPY N/A 10/19/2017   Procedure: FLEXIBLE SIGMOIDOSCOPY;  Surgeon: Andria Meuse, MD;  Location: [facility];  Service:  General;  Laterality: N/A;   ILEOSTOMY N/A 11/28/2017   Procedure: CLOSURE OF LOOP ILEOSTOMY ERAS PATHWAY;  Surgeon: Andria Meuse, MD;  Location: [facility];  Service: General;  Laterality: N/A;   ILEOSTOMY REVISION     11-28-17   LAPAROSCOPIC LOW ANTERIOR RESECTION N/A 07/26/2017   Procedure: LAPAROSCOPIC VS OPEN  LOW ANTERIOR RESECTION WITH DIVERITING LOOP ILEOSTOMY;  Surgeon: Andria Meuse, MD;  Location: [facility];  Service: General;  Laterality: N/A;   PROCTOSCOPY  07/26/2017   Procedure: PROCTOSCOPY;  Surgeon: Andria Meuse, MD;  Location: [facility];  Service: General;;   TONSILLECTOMY     TUBAL LIGATION     WISDOM TOOTH EXTRACTION     Patient Active Problem List   Diagnosis Date Noted   Adjustment disorder with anxious mood 04/08/2019   Major depressive disorder, recurrent (HCC)    Exertional dyspnea 03/27/2019   Chest pain 02/12/2019   HTN (hypertension)    Rectal cancer (HCC) 07/26/2017   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   REFERRING DIAG:  M62.89 (ICD-10-CM) - Other specified disorders of muscle  R19.8 (ICD-10-CM) - Other specified symptoms and signs involving the digestive system and abdomen  Z85.048 (ICD-10-CM) - Personal history of other malignant neoplasm of rectum, rectosigmoid junction, and anus      THERAPY DIAG:  Cramp and spasm   Pelvic pain   Pelvic  floor dysfunction in female   Rationale for Evaluation and Treatment: Rehabilitation   ONSET DATE: 11/28/2017   SUBJECTIVE:                                                                                                                                                                                            SUBJECTIVE STATEMENT: frequent bowel movements after eating and had to alter her diet. The need to have bowel movements interfered with her ability to work and go outside of her home. When she develops constipation she develops rectal pain and a "tear" or sharp pain approximately  monthly. She has been prescribed diltiazem but this had not helped. she may have some component of dyssynergy defecation/nonrelaxation that has been acquired which would not be unusual in this setting. She also has an acute appearing anal fissure . Patient tries not to eat to reduce the number of bowel movements and to adjust with work and going out.  Fluid intake: Yes: water     PAIN:  Are you having pain? Yes NPRS scale: 8/10 Pain location:  rectal    Pain type: tearing of the tissue pain, sometimes has some blood Pain description: intermittent    Aggravating factors: bowel movement Relieving factors: complete bowel movement   PRECAUTIONS: Other: rectal cancer   WEIGHT BEARING RESTRICTIONS: No   FALLS:  Has patient fallen in last 6 months? 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 Completed 05/30/2017; hysterectomy     BOWEL MOVEMENT: Pain with bowel movement: Yes Type of bowel movement:Type (Bristol Stool Scale) type 6 mostly and type 2 short pieces, Frequency every other day, Strain Yes, and Splinting no Fully empty rectum: No, needs to go multiple times and feels like the stool is still there, sitting 30 or more minutes then will get the neuropathy Leakage: Yes: 3 times full blown accident per month; stool leakage with a little bit 5 times per month Pads: No, will use an Adult diaper when happens Fiber supplement: No, MD said no fiber supplement   URINATION: Pain with urination: No Fully empty bladder: Yes:   Stream: Strong Urgency: No Frequency: every couple of hours Leakage:  none Pads: No    INTERCOURSE: reduction of moisture, fear of bowel movement Pain with intercourse:  see  above Ability to have vaginal penetration:  Yes:   Climax: sometimes Marinoff Scale: 1/3   PREGNANCY: Vaginal deliveries 3 Tearing Yes: first child   PROLAPSE: None     OBJECTIVE:    DIAGNOSTIC FINDINGS:  none   PATIENT SURVEYS:  CRAIQ-7:57   COGNITION: Overall cognitive status: Within functional limits for tasks assessed                          SENSATION: Light touch: Appears intact Proprioception: Appears intact     POSTURE: No Significant postural limitations   PELVIC ALIGNMENT:   LUMBARAROM/PROM: Lumbar ROM is full     LOWER EXTREMITY ROM: bilateral hip ROM is within normal limits     LOWER EXTREMITY MMT:   MMT Right eval Left eval  Hip extension 4/5 4/5  Hip abduction 4/5 4/5  Hip adduction 4/5 4/5    PALPATION:   General  scar from surgery is restricted                 External Perineal Exam tenderness located in the external anal sphincter, along the perineal body, levator ani externally                             Internal Pelvic Floor therapist is not able to place her finger more than 1/2 inch due to restrictions and pain. Not able to test the patient strength due to the restrictions   Patient confirms identification and approves PT to assess internal pelvic floor and treatment Yes   PELVIC MMT:   MMT eval  Vaginal    Internal Anal Sphincter    External Anal Sphincter    Puborectalis    Diastasis Recti    (Blank rows = not tested)         TONE: Increased    PROLAPSE: Not assessed   TODAY'S TREATMENT:   12/06/22 Manual: Soft tissue mobilization: Circular massage to the abdomen to promote peristalic motion of the intestines then educated patient on how to perform on her own.  Soft tissue work to bilateral diaphragm and used breath to stretch it further Scar tissue mobilization: Using the suction cup to the lower abdominal scar to release the  fascia Manual work to the scar to improve mobility Myofascial release: Fascial release to the right lower quadrant to improve the intestinal mobility Exercises: Stretches/mobility: Marjo Bicker pose hold 30 sec  Cat cow 15 x Double knee to chest with knees wide Supine piriformis stretch  supine holding 30 sec bil.  Lateral hip stretch pulling leg over the body holding 30 sec bil.  Diaphragmatic breathing with therapist assisting the patient lower rib cage into the downward movement Strengthening: Nustep level 3 for 5 minutes while assess patient  PATIENT EDUCATION: 12/06/22 Education details: Access Code: [email or web address] Person educated: Patient Education method: Explanation, Demonstration, Tactile cues, Verbal cues, and Handouts Education comprehension: verbalized understanding, returned demonstration, verbal cues required, tactile cues required, and needs further education     HOME EXERCISE PROGRAM: 12/06/22 Access Code: [email or web address] URL: [email or web address] Date: 12/06/2022 Prepared by: Eulis Foster  Exercises - Cat Cow  - 1 x daily - 7 x weekly - 1 sets - 10 reps - Child's Pose Stretch  - 1 x daily - 7 x weekly - 1 sets - 2 reps - 30 sec hold - Supine Pelvic Floor Stretch  - 1 x daily - 7 x weekly - 1 sets - 1 reps - 30 - 60 sec hold - Supine Figure 4 Piriformis Stretch with Leg Extension  - 1 x daily - 7 x weekly - 1 sets - 2 reps - 30 sec hold - Supine Piriformis Stretch with Leg Straight  - 1 x daily - 7 x weekly - 1 sets - 2 reps - 30 sec hold    ASSESSMENT:   CLINICAL IMPRESSION: Patient is a 47 y.o. female who was seen today for physical therapy  treatment for pelvic pain and fecal leakage. Patient had decreased swelling of the abdomen after manual work. She had improved tissue mobility of the abdomen. She has learned stretches to do for mobility  of her hips. She is getting comfortable with manual work around the anus to relax the tissue. Patient will benefit from skilled therapy to reduce pain, improve pelvic floor tissue mobility and improve pelvic floor coordination to relax when having a bowel movement.    OBJECTIVE IMPAIRMENTS: decreased activity tolerance, decreased coordination, decreased endurance, decreased mobility, decreased strength, increased fascial restrictions, increased muscle spasms, impaired tone, and pain.    ACTIVITY LIMITATIONS: bending, sitting, standing, squatting, continence, locomotion level, and caring for others   PARTICIPATION LIMITATIONS: meal prep, cleaning, laundry, interpersonal relationship, shopping, community activity, and occupation   PERSONAL FACTORS: Age, Time since onset of injury/illness/exacerbation, and 3+ comorbidities: rectal cancer 03/23/2018; LAR with ileostomy 07/26/2017; ileostomy reversal 11/28/2017; Initiation of radiation and concurrent Xeloda 04/23/2017. Completed 05/30/2017; hysterectomy  are also affecting patient's functional outcome.    REHAB POTENTIAL: Good   CLINICAL DECISION MAKING: Evolving/moderate complexity   EVALUATION COMPLEXITY: Moderate     GOALS: Goals reviewed with patient? Yes   SHORT TERM GOALS: Target date: 12/19/22   Patient independent with manual work to the anal sphincter and pelvic floor to reduce tension.  Baseline:Not educated yet Goal status: Met 12/06/22   2.  Patent is independent with hip stretches to lengthen the pelvic floor.  Baseline: Not educated yet Goal status: INITIAL   3.  Patient educated on abdominal massage to improve toileting.  Baseline: Not educated yet Goal status: INITIAL   4.  Patient is able to relax her pelvic floor to place the smallest dilator into the rectum to expand the tissue.  Baseline: therapist not able to place her index finger into the rectum.  Goal status: INITIAL     LONG TERM GOALS: Target date: 02/18/23    Patient independent with advanced HEP for core strength and improvement of coordination Baseline: Not educated  Goal status: INITIAL   2.  Patient is able to relax her pelvic floor to place the largest dilator into the rectum with minimal to no pain.  Baseline: therapist not able to place her index finger into the rectum Goal status: INITIAL   3.  Patient is able  to relax the pelvic floor to push stool out with >/= 75% greater ease and pain decreased </= 2-3/10.  Baseline: pain level is 10/10 and she gets fissures due to not able to relax the pelvic floor.  Goal status: INITIAL   4.  Patient is able to go to the bathroom 3 or less times per day to empty her rectum due to improved pelvic floor coordination and less pain.  Baseline: will go for several hours having a bowel movement.  Goal status: INITIAL   5.  CRAIQ-7 is </= 20 due to reduction of pain and less frustration.  Baseline: CRAIQ-7:57 Goal status: INITIAL   6.  Patient fecal leakage decreased >/= 75% so she does not have to wear adult diapers.  Baseline: she has 2 full stool emptying per month requiring her to wear adult diapers Goal status: INITIAL   PLAN:   PT FREQUENCY: 1-2x/week   PT DURATION: 12 weeks   PLANNED INTERVENTIONS: Therapeutic exercises, Therapeutic activity, Neuromuscular re-education, Patient/Family education, Joint mobilization, Dry Needling, Electrical stimulation, scar mobilization, Taping, Biofeedback, and Manual therapy   PLAN FOR NEXT SESSION: manual work to the abdomen; manual work to the pelvic floor;  diaphragmatic breathing, work on side trunk lengthening    Eulis Foster, PT 12/06/22 10:15 AM

## 2022-12-18 ENCOUNTER — Ambulatory Visit: Payer: Medicaid Other | Admitting: Physical Therapy

## 2022-12-18 ENCOUNTER — Encounter: Payer: Self-pay | Admitting: Physical Therapy

## 2022-12-18 DIAGNOSIS — R279 Unspecified lack of coordination: Secondary | ICD-10-CM | POA: Diagnosis not present

## 2022-12-18 DIAGNOSIS — M6289 Other specified disorders of muscle: Secondary | ICD-10-CM | POA: Diagnosis not present

## 2022-12-18 DIAGNOSIS — R252 Cramp and spasm: Secondary | ICD-10-CM | POA: Diagnosis not present

## 2022-12-18 DIAGNOSIS — R102 Pelvic and perineal pain: Secondary | ICD-10-CM | POA: Diagnosis not present

## 2022-12-18 NOTE — Therapy (Signed)
OUTPATIENT PHYSICAL THERAPY TREATMENT NOTE   Patient Name: Donna Brandt MRN: [id number] DOB:09/07/1975, 47 y.o., female Today's Date: 12/18/2022  PCP: Caffie Damme, MD  REFERRING PROVIDER: Andria Meuse, MD   END OF SESSION:   PT End of Session - 12/18/22 1453     Visit Number 3    Date for PT Re-Evaluation 02/19/23    Authorization Type [employer]    Authorization Time Period 4/22-7/20    Authorization - Visit Number 2    Authorization - Number of Visits 11    PT Start Time 1445    PT Stop Time 1525    PT Time Calculation (min) 40 min    Activity Tolerance Patient tolerated treatment well    Behavior During Therapy [facility] for tasks assessed/performed             Past Medical History:  Diagnosis Date   Adjustment disorder with anxious mood 04/08/2019   Anemia    Colon cancer (HCC) 2018   rectal   Complication of anesthesia    during colonoscopy woke up! and wisdom teeth extraction   HTN (hypertension)    Major depressive disorder, recurrent (HCC)    Migraine    Sickle cell trait (HCC)    Sleep apnea    Past Surgical History:  Procedure Laterality Date   ABDOMINAL HYSTERECTOMY     due to uterine fibroids   AUGMENTATION MAMMAPLASTY Bilateral    COLONOSCOPY  07/16/2018   ENDOMETRIAL ABLATION     FLEXIBLE SIGMOIDOSCOPY N/A 04/12/2017   Procedure: FLEXIBLE SIGMOIDOSCOPY;  Surgeon: Rachael Fee, MD;  Location: [facility];  Service: Endoscopy;  Laterality: N/A;   FLEXIBLE SIGMOIDOSCOPY N/A 07/18/2017   Procedure: FLEXIBLE SIGMOIDOSCOPY EXAM UNDER ANESTHESIA;  Surgeon: Andria Meuse, MD;  Location: [facility];  Service: General;  Laterality: N/A;   FLEXIBLE SIGMOIDOSCOPY  07/26/2017   Procedure: FLEXIBLE SIGMOIDOSCOPY;  Surgeon: Andria Meuse, MD;  Location: [facility];  Service: General;;   FLEXIBLE SIGMOIDOSCOPY N/A 10/19/2017   Procedure: FLEXIBLE SIGMOIDOSCOPY;  Surgeon: Andria Meuse, MD;  Location: [facility];  Service:  General;  Laterality: N/A;   ILEOSTOMY N/A 11/28/2017   Procedure: CLOSURE OF LOOP ILEOSTOMY ERAS PATHWAY;  Surgeon: Andria Meuse, MD;  Location: [facility];  Service: General;  Laterality: N/A;   ILEOSTOMY REVISION     11-28-17   LAPAROSCOPIC LOW ANTERIOR RESECTION N/A 07/26/2017   Procedure: LAPAROSCOPIC VS OPEN  LOW ANTERIOR RESECTION WITH DIVERITING LOOP ILEOSTOMY;  Surgeon: Andria Meuse, MD;  Location: [facility];  Service: General;  Laterality: N/A;   PROCTOSCOPY  07/26/2017   Procedure: PROCTOSCOPY;  Surgeon: Andria Meuse, MD;  Location: [facility];  Service: General;;   TONSILLECTOMY     TUBAL LIGATION     WISDOM TOOTH EXTRACTION     Patient Active Problem List   Diagnosis Date Noted   Adjustment disorder with anxious mood 04/08/2019   Major depressive disorder, recurrent (HCC)    Exertional dyspnea 03/27/2019   Chest pain 02/12/2019   HTN (hypertension)    Rectal cancer (HCC) 07/26/2017   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   REFERRING DIAG:  M62.89 (ICD-10-CM) - Other specified disorders of muscle  R19.8 (ICD-10-CM) - Other specified symptoms and signs involving the digestive system and abdomen  Z85.048 (ICD-10-CM) - Personal history of other malignant neoplasm of rectum, rectosigmoid junction, and anus      THERAPY DIAG:  Cramp and spasm   Pelvic pain   Pelvic  floor dysfunction in female   Rationale for Evaluation and Treatment: Rehabilitation   ONSET DATE: 11/28/2017   SUBJECTIVE:                                                                                                                                                                                            SUBJECTIVE STATEMENT: I am feeling backed up due to not being able to go to the bathroom.     PAIN:  Are you having pain? Yes NPRS scale: 8/10 Pain location:  rectal    Pain type: tearing of the tissue pain, sometimes has some blood Pain description: intermittent     Aggravating factors: bowel movement Relieving factors: complete bowel movement   PRECAUTIONS: Other: rectal cancer   WEIGHT BEARING RESTRICTIONS: No   FALLS:  Has patient fallen in last 6 months? 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 Completed 05/30/2017; hysterectomy     BOWEL MOVEMENT: Pain with bowel movement: Yes Type of bowel movement:Type (Bristol Stool Scale) type 6 mostly and type 2 short pieces, Frequency every other day, Strain Yes, and Splinting no Fully empty rectum: No, needs to go multiple times and feels like the stool is still there, sitting 30 or more minutes then will get the neuropathy Leakage: Yes: 3 times full blown accident per month; stool leakage with a little bit 5 times per month Pads: No, will use an Adult diaper when happens Fiber supplement: No, MD said no fiber supplement   URINATION: Pain with urination: No Fully empty bladder: Yes:   Stream: Strong Urgency: No Frequency: every couple of hours Leakage:  none Pads: No   INTERCOURSE: reduction of moisture, fear of bowel movement Pain with intercourse:  see above Ability to have vaginal penetration:  Yes:   Climax: sometimes Marinoff Scale: 1/3   PREGNANCY: Vaginal deliveries 3 Tearing Yes: first child   PROLAPSE: None     OBJECTIVE:    DIAGNOSTIC FINDINGS:  none   PATIENT SURVEYS:  CRAIQ-7:57   COGNITION: Overall cognitive status: Within functional limits for tasks assessed                          SENSATION: Light touch: Appears intact Proprioception: Appears intact  POSTURE: No Significant postural  limitations   PELVIC ALIGNMENT:   LUMBARAROM/PROM: Lumbar ROM is full     LOWER EXTREMITY ROM: bilateral hip ROM is within normal limits     LOWER EXTREMITY MMT:   MMT Right eval Left eval  Hip extension 4/5 4/5  Hip abduction 4/5 4/5  Hip adduction 4/5 4/5    PALPATION:   General  scar from surgery is restricted                 External Perineal Exam tenderness located in the external anal sphincter, along the perineal body, levator ani externally                             Internal Pelvic Floor therapist is not able to place her finger more than 1/2 inch due to restrictions and pain. Not able to test the patient strength due to the restrictions   Patient confirms identification and approves PT to assess internal pelvic floor and treatment Yes   PELVIC MMT:   MMT eval  Vaginal    Internal Anal Sphincter    External Anal Sphincter    Puborectalis    Diastasis Recti    (Blank rows = not tested)         TONE: Increased    PROLAPSE: Not assessed   TODAY'S TREATMENT:   12/18/22 Manual: Soft tissue mobilization: Circular massage to the abdomen to promote peristalic motion of the intestines then educated patient on how to perform on her own. Myofascial release: Quadruped pulling the fascia forward to release the abdomen and improve intestinal movement Fascial release of the lower abdomen and mesenteric root Fascial release along the umbilicus Exercises: Stretches/mobility: Marjo Bicker pose with manual work to the lumbar paraspinals and working on breath to open up the posterior rib cage.  Strengthening: Nustep level 3 for 5 minutes while assess patient    12/06/22 Manual: Soft tissue mobilization: Circular massage to the abdomen to promote peristalic motion of the intestines then educated patient on how to perform on her own.  Soft tissue work to bilateral diaphragm and used breath to stretch it further Scar tissue mobilization: Using the suction cup to the lower  abdominal scar to release the fascia Manual work to the scar to improve mobility Myofascial release: Fascial release to the right lower quadrant to improve the intestinal mobility Exercises: Stretches/mobility: Marjo Bicker pose hold 30 sec  Cat cow 15 x Double knee to chest with knees wide Supine piriformis stretch  supine holding 30 sec bil.  Lateral hip stretch pulling leg over the body holding 30 sec bil.  Diaphragmatic breathing with therapist assisting the patient lower rib cage into the downward movement Strengthening: Nustep level 3 for 5 minutes while assess patient  PATIENT EDUCATION: 12/18/22 Education details: Access Code: [email or web address], discussed with patient on anal dilators and sent in script Person educated: Patient Education method: Explanation, Demonstration, Tactile cues, Verbal cues, and Handouts Education comprehension: verbalized understanding, returned demonstration, verbal cues required, tactile cues required, and needs further education       HOME EXERCISE PROGRAM: 12/06/22 Access Code: [email or web address] URL: [email or web address] Date: 12/06/2022 Prepared by: Eulis Foster   Exercises - Cat Cow  - 1 x daily - 7 x weekly - 1 sets - 10 reps - Child's Pose Stretch  - 1 x daily - 7 x weekly - 1 sets - 2 reps - 30 sec hold - Supine Pelvic Floor Stretch  - 1 x daily - 7 x weekly - 1 sets - 1 reps - 30 - 60 sec hold - Supine Figure 4 Piriformis Stretch with Leg Extension  - 1 x daily - 7 x weekly - 1 sets - 2 reps - 30 sec hold - Supine Piriformis Stretch with Leg Straight  - 1 x daily - 7 x weekly - 1 sets - 2 reps - 30 sec hold     ASSESSMENT:   CLINICAL IMPRESSION: Patient is a 47 y.o. female who was seen today for physical therapy  treatment for pelvic pain and fecal leakage. Patient was able to use her bathroom technique and the stool  came out but there was blood. She is stretching the scar tissue in the rectum when having a bowel movement. She had increased fascial restrictions in the abdomen. She has the urge to have a bowel movement after manual work.  Patient will benefit from skilled therapy to reduce pain, improve pelvic floor tissue mobility and improve pelvic floor coordination to relax when having a bowel movement.    OBJECTIVE IMPAIRMENTS: decreased activity tolerance, decreased coordination, decreased endurance, decreased mobility, decreased strength, increased fascial restrictions, increased muscle spasms, impaired tone, and pain.    ACTIVITY LIMITATIONS: bending, sitting, standing, squatting, continence, locomotion level, and caring for others   PARTICIPATION LIMITATIONS: meal prep, cleaning, laundry, interpersonal relationship, shopping, community activity, and occupation   PERSONAL FACTORS: Age, Time since onset of injury/illness/exacerbation, and 3+ comorbidities: rectal cancer 03/23/2018; LAR with ileostomy 07/26/2017; ileostomy reversal 11/28/2017; Initiation of radiation and concurrent Xeloda 04/23/2017. Completed 05/30/2017; hysterectomy  are also affecting patient's functional outcome.    REHAB POTENTIAL: Good   CLINICAL DECISION MAKING: Evolving/moderate complexity   EVALUATION COMPLEXITY: Moderate     GOALS: Goals reviewed with patient? Yes   SHORT TERM GOALS: Target date: 12/19/22   Patient independent with manual work to the anal sphincter and pelvic floor to reduce tension.  Baseline:Not educated yet Goal status: Met 12/06/22   2.  Patent is independent with hip stretches to lengthen the pelvic floor.  Baseline: Not educated yet Goal status: INITIAL   3.  Patient educated on abdominal massage to improve toileting.  Baseline: Not educated yet Goal status: Met 12/18/22   4.  Patient is able to relax her pelvic floor to place the smallest dilator into the rectum to expand the tissue.  Baseline:  therapist not able to place her index finger into the rectum.  Goal status: INITIAL     LONG TERM GOALS: Target date: 02/18/23   Patient independent with advanced HEP for core strength and improvement of coordination Baseline: Not educated  Goal status: INITIAL   2.  Patient is able to relax her pelvic floor to place the largest dilator into the rectum with minimal to no  pain.  Baseline: therapist not able to place her index finger into the rectum Goal status: INITIAL   3.  Patient is able to relax the pelvic floor to push stool out with >/= 75% greater ease and pain decreased </= 2-3/10.  Baseline: pain level is 10/10 and she gets fissures due to not able to relax the pelvic floor.  Goal status: INITIAL   4.  Patient is able to go to the bathroom 3 or less times per day to empty her rectum due to improved pelvic floor coordination and less pain.  Baseline: will go for several hours having a bowel movement.  Goal status: INITIAL   5.  CRAIQ-7 is </= 20 due to reduction of pain and less frustration.  Baseline: CRAIQ-7:57 Goal status: INITIAL   6.  Patient fecal leakage decreased >/= 75% so she does not have to wear adult diapers.  Baseline: she has 2 full stool emptying per month requiring her to wear adult diapers Goal status: INITIAL   PLAN:   PT FREQUENCY: 1-2x/week   PT DURATION: 12 weeks   PLANNED INTERVENTIONS: Therapeutic exercises, Therapeutic activity, Neuromuscular re-education, Patient/Family education, Joint mobilization, Dry Needling, Electrical stimulation, scar mobilization, Taping, Biofeedback, and Manual therapy   PLAN FOR NEXT SESSION: manual work to the abdomen; manual work to the pelvic floor;  diaphragmatic breathing,  ask about the dilators  Eulis Foster, PT 12/18/22 2:55 PM

## 2022-12-27 ENCOUNTER — Ambulatory Visit: Payer: Medicaid Other | Admitting: Physical Therapy

## 2022-12-29 DIAGNOSIS — R2242 Localized swelling, mass and lump, left lower limb: Secondary | ICD-10-CM | POA: Diagnosis not present

## 2022-12-29 DIAGNOSIS — Z7985 Long-term (current) use of injectable non-insulin antidiabetic drugs: Secondary | ICD-10-CM | POA: Diagnosis not present

## 2022-12-29 DIAGNOSIS — E119 Type 2 diabetes mellitus without complications: Secondary | ICD-10-CM | POA: Diagnosis not present

## 2023-01-05 ENCOUNTER — Ambulatory Visit: Payer: Medicaid Other | Admitting: Physical Therapy

## 2023-01-19 ENCOUNTER — Ambulatory Visit: Payer: Medicaid Other | Attending: Surgery | Admitting: Physical Therapy

## 2023-01-19 DIAGNOSIS — R279 Unspecified lack of coordination: Secondary | ICD-10-CM | POA: Insufficient documentation

## 2023-01-19 DIAGNOSIS — M6289 Other specified disorders of muscle: Secondary | ICD-10-CM | POA: Insufficient documentation

## 2023-01-19 DIAGNOSIS — R252 Cramp and spasm: Secondary | ICD-10-CM | POA: Insufficient documentation

## 2023-01-19 DIAGNOSIS — R102 Pelvic and perineal pain: Secondary | ICD-10-CM | POA: Insufficient documentation

## 2023-01-26 ENCOUNTER — Encounter: Payer: Self-pay | Admitting: Physical Therapy

## 2023-01-26 ENCOUNTER — Ambulatory Visit: Payer: Medicaid Other | Admitting: Physical Therapy

## 2023-01-26 DIAGNOSIS — R102 Pelvic and perineal pain: Secondary | ICD-10-CM

## 2023-01-26 DIAGNOSIS — R252 Cramp and spasm: Secondary | ICD-10-CM

## 2023-01-26 DIAGNOSIS — M6289 Other specified disorders of muscle: Secondary | ICD-10-CM | POA: Diagnosis present

## 2023-01-26 DIAGNOSIS — R279 Unspecified lack of coordination: Secondary | ICD-10-CM | POA: Diagnosis present

## 2023-01-26 NOTE — Therapy (Signed)
OUTPATIENT PHYSICAL THERAPY TREATMENT NOTE   Patient Name: Donna Brandt MRN: [id number] DOB:1976/06/07, 47 y.o., female Today's Date: 01/26/2023  PCP: Caffie Damme, MD  REFERRING PROVIDER: Andria Meuse, MD   END OF SESSION:   PT End of Session - 01/26/23 1102     Visit Number 4    Date for PT Re-Evaluation 02/19/23    Authorization Type [employer]    Authorization Time Period 4/22-7/20    Authorization - Visit Number 3    Authorization - Number of Visits 11    PT Start Time 1100    PT Stop Time 1140    PT Time Calculation (min) 40 min    Activity Tolerance Patient tolerated treatment well    Behavior During Therapy [facility] for tasks assessed/performed             Past Medical History:  Diagnosis Date   Adjustment disorder with anxious mood 04/08/2019   Anemia    Colon cancer (HCC) 2018   rectal   Complication of anesthesia    during colonoscopy woke up! and wisdom teeth extraction   HTN (hypertension)    Major depressive disorder, recurrent (HCC)    Migraine    Sickle cell trait (HCC)    Sleep apnea    Past Surgical History:  Procedure Laterality Date   ABDOMINAL HYSTERECTOMY     due to uterine fibroids   AUGMENTATION MAMMAPLASTY Bilateral    COLONOSCOPY  07/16/2018   ENDOMETRIAL ABLATION     FLEXIBLE SIGMOIDOSCOPY N/A 04/12/2017   Procedure: FLEXIBLE SIGMOIDOSCOPY;  Surgeon: Rachael Fee, MD;  Location: [facility];  Service: Endoscopy;  Laterality: N/A;   FLEXIBLE SIGMOIDOSCOPY N/A 07/18/2017   Procedure: FLEXIBLE SIGMOIDOSCOPY EXAM UNDER ANESTHESIA;  Surgeon: Andria Meuse, MD;  Location: [facility];  Service: General;  Laterality: N/A;   FLEXIBLE SIGMOIDOSCOPY  07/26/2017   Procedure: FLEXIBLE SIGMOIDOSCOPY;  Surgeon: Andria Meuse, MD;  Location: [facility];  Service: General;;   FLEXIBLE SIGMOIDOSCOPY N/A 10/19/2017   Procedure: FLEXIBLE SIGMOIDOSCOPY;  Surgeon: Andria Meuse, MD;  Location: [facility];  Service:  General;  Laterality: N/A;   ILEOSTOMY N/A 11/28/2017   Procedure: CLOSURE OF LOOP ILEOSTOMY ERAS PATHWAY;  Surgeon: Andria Meuse, MD;  Location: [facility];  Service: General;  Laterality: N/A;   ILEOSTOMY REVISION     11-28-17   LAPAROSCOPIC LOW ANTERIOR RESECTION N/A 07/26/2017   Procedure: LAPAROSCOPIC VS OPEN  LOW ANTERIOR RESECTION WITH DIVERITING LOOP ILEOSTOMY;  Surgeon: Andria Meuse, MD;  Location: [facility];  Service: General;  Laterality: N/A;   PROCTOSCOPY  07/26/2017   Procedure: PROCTOSCOPY;  Surgeon: Andria Meuse, MD;  Location: [facility];  Service: General;;   TONSILLECTOMY     TUBAL LIGATION     WISDOM TOOTH EXTRACTION     Patient Active Problem List   Diagnosis Date Noted   Adjustment disorder with anxious mood 04/08/2019   Major depressive disorder, recurrent (HCC)    Exertional dyspnea 03/27/2019   Chest pain 02/12/2019   HTN (hypertension)    Rectal cancer (HCC) 07/26/2017   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   REFERRING DIAG:  M62.89 (ICD-10-CM) - Other specified disorders of muscle  R19.8 (ICD-10-CM) - Other specified symptoms and signs involving the digestive system and abdomen  Z85.048 (ICD-10-CM) - Personal history of other malignant neoplasm of rectum, rectosigmoid junction, and anus      THERAPY DIAG:  Cramp and spasm   Pelvic pain   Pelvic  floor dysfunction in female   Rationale for Evaluation and Treatment: Rehabilitation   ONSET DATE: 11/28/2017   SUBJECTIVE:                                                                                                                                                                                            SUBJECTIVE STATEMENT: I have been sick and everything I eat and I have diarrhea. If I urinate I have diarrhea. I have an urge and need to go to the bathroom immediately. I have not had a solid stool at all since last Friday. I ordered the rectal dilators but not able to use them due to  the diarrhea.    PAIN:  Are you having pain? Yes NPRS scale: 6/10 Pain location:  rectal    Pain type: tearing of the tissue pain, sometimes has some blood Pain description: intermittent    Aggravating factors: bowel movement Relieving factors: complete bowel movement   PRECAUTIONS: Other: rectal cancer   WEIGHT BEARING RESTRICTIONS: No   FALLS:  Has patient fallen in last 6 months? 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 Completed 05/30/2017; hysterectomy     BOWEL MOVEMENT: Pain with bowel movement: Yes Type of bowel movement:Type (Bristol Stool Scale) type 6 mostly and type 2 short pieces, Frequency every other day, Strain Yes, and Splinting no Fully empty rectum: No, needs to go multiple times and feels like the stool is still there, sitting 30 or more minutes then will get the neuropathy Leakage: Yes: 3 times full blown accident per month; stool leakage with a little bit 5 times per month Pads: No, will use an Adult diaper when happens Fiber supplement: No, MD said no fiber supplement   URINATION: Pain with urination: No Fully empty bladder: Yes:   Stream: Strong Urgency: No Frequency: every couple of hours Leakage:  none Pads: No   INTERCOURSE: reduction of moisture, fear of bowel movement Pain with intercourse:  see above Ability to have vaginal penetration:  Yes:   Climax: sometimes Marinoff Scale: 1/3   PREGNANCY: Vaginal deliveries 3 Tearing Yes: first child   PROLAPSE: None     OBJECTIVE:    DIAGNOSTIC FINDINGS:  none   PATIENT SURVEYS:  CRAIQ-7:57   COGNITION: Overall  cognitive status:  Within functional limits for tasks assessed                          SENSATION: Light touch: Appears intact Proprioception: Appears intact     POSTURE: No Significant postural limitations   PELVIC ALIGNMENT:   LUMBARAROM/PROM: Lumbar ROM is full     LOWER EXTREMITY ROM: bilateral hip ROM is within normal limits     LOWER EXTREMITY MMT:   MMT Right eval Left eval  Hip extension 4/5 4/5  Hip abduction 4/5 4/5  Hip adduction 4/5 4/5    PALPATION:   General  scar from surgery is restricted                 External Perineal Exam tenderness located in the external anal sphincter, along the perineal body, levator ani externally                             Internal Pelvic Floor therapist is not able to place her finger more than 1/2 inch due to restrictions and pain. Not able to test the patient strength due to the restrictions   Patient confirms identification and approves PT to assess internal pelvic floor and treatment Yes   PELVIC MMT:   MMT eval  Vaginal    Internal Anal Sphincter    External Anal Sphincter    Puborectalis    Diastasis Recti    (Blank rows = not tested)         TONE: Increased    PROLAPSE: Not assessed   TODAY'S TREATMENT:   01/26/23 Exercises: Stretches/mobility: Hamstring stretch supine holding 30 sec bil.  Supine piriformis stretch  supine holding 30 sec bil. Lateral hip stretch pulling leg over the body holding 30 sec bil. Pigeon pose hold 30 sec bil.  Reverse clam 10 x each side Strengthening: Supine marching with abdominal contraction 15 x each leg Bridge 15 x  Bird dog 10 x each way  12/18/22 Manual: Soft tissue mobilization: Circular massage to the abdomen to promote peristalic motion of the intestines then educated patient on how to perform on her own. Myofascial release: Quadruped pulling the fascia forward to release the abdomen and improve intestinal movement Fascial release of the lower abdomen and  mesenteric root Fascial release along the umbilicus Exercises: Stretches/mobility: Marjo Bicker pose with manual work to the lumbar paraspinals and working on breath to open up the posterior rib cage.  Strengthening: Nustep level 3 for 5 minutes while assess patient    12/06/22 Manual: Soft tissue mobilization: Circular massage to the abdomen to promote peristalic motion of the intestines then educated patient on how to perform on her own.  Soft tissue work to bilateral diaphragm and used breath to stretch it further Scar tissue mobilization: Using the suction cup to the lower abdominal scar to release the fascia Manual work to the scar to improve mobility Myofascial release: Fascial release to the right lower quadrant to improve the intestinal mobility Exercises: Stretches/mobility: Marjo Bicker pose hold 30 sec  Cat cow 15 x Double knee to chest with knees wide Supine piriformis stretch  supine holding 30 sec bil.  Lateral hip stretch pulling leg over the body holding 30 sec bil.  Diaphragmatic breathing with therapist assisting the patient lower rib cage into the downward movement Strengthening: Nustep level 3 for 5 minutes while assess patient  PATIENT EDUCATION: 01/26/23 Education details: Access Code: [email or web address], discussed with patient on anal dilators and sent in script Person educated: Patient Education method: Explanation, Demonstration, Tactile cues, Verbal cues, and Handouts Education comprehension: verbalized understanding, returned demonstration, verbal cues required, tactile cues required, and needs further education       HOME EXERCISE PROGRAM: 01/26/23 Access Code: [email or web address] URL: [email or web address] Date: 01/26/2023 Prepared by: Eulis Foster  Exercises - Cat Cow  - 1 x daily - 7 x weekly - 1 sets - 10 reps - Child's Pose Stretch  - 1  x daily - 7 x weekly - 1 sets - 2 reps - 30 sec hold - Supine Pelvic Floor Stretch  - 1 x daily - 7 x weekly - 1 sets - 1 reps - 30 - 60 sec hold - Supine Figure 4 Piriformis Stretch with Leg Extension  - 1 x daily - 7 x weekly - 1 sets - 2 reps - 30 sec hold - Supine Piriformis Stretch with Leg Straight  - 1 x daily - 7 x weekly - 1 sets - 2 reps - 30 sec hold - Supine Bridge  - 1 x daily - 2 x weekly - 3 sets - 10 reps - Supine March  - 1 x daily - 2 x weekly - 2 sets - 10 reps - Sidelying Reverse Clamshell  - 1 x daily - 2 x weekly - 1 sets - 10 reps - Bird Dog  - 1 x daily - 2 x weekly - 2 sets - 10 reps   ASSESSMENT:   CLINICAL IMPRESSION: Patient is a 47 y.o. female who was seen today for physical therapy  treatment for pelvic pain and fecal leakage.   Patient is having a virus that caused diarrhea and is still lasting. Patient is having rectal pain due to the frequent bowel movements. Patient is working on her core. She was not tolerating internal work or manual work to the abdomen. Patient has purchased the dilators but not used them due to the diarrhea. Patient will benefit from skilled therapy to reduce pain, improve pelvic floor tissue mobility and improve pelvic floor coordination to relax when having a bowel movement.    OBJECTIVE IMPAIRMENTS: decreased activity tolerance, decreased coordination, decreased endurance, decreased mobility, decreased strength, increased fascial restrictions, increased muscle spasms, impaired tone, and pain.    ACTIVITY LIMITATIONS: bending, sitting, standing, squatting, continence, locomotion level, and caring for others   PARTICIPATION LIMITATIONS: meal prep, cleaning, laundry, interpersonal relationship, shopping, community activity, and occupation   PERSONAL FACTORS: Age, Time since onset of injury/illness/exacerbation, and 3+ comorbidities: rectal cancer 03/23/2018; LAR with ileostomy 07/26/2017; ileostomy reversal 11/28/2017; Initiation of radiation  and concurrent Xeloda 04/23/2017. Completed 05/30/2017; hysterectomy  are also affecting patient's functional outcome.    REHAB POTENTIAL: Good   CLINICAL DECISION MAKING: Evolving/moderate complexity   EVALUATION COMPLEXITY: Moderate     GOALS: Goals reviewed with patient? Yes   SHORT TERM GOALS: Target date: 12/19/22   Patient independent with manual work to the anal sphincter and pelvic floor to reduce tension.  Baseline:Not educated yet Goal status: Met 12/06/22   2.  Patent is independent with hip stretches to lengthen the pelvic floor.  Baseline: Not educated yet Goal status: Met 01/26/23   3.  Patient educated on abdominal massage to improve toileting.  Baseline: Not educated yet Goal status: Met 12/18/22   4.  Patient is able to relax her pelvic floor to place the smallest dilator into the rectum to  expand the tissue.  Baseline: therapist not able to place her index finger into the rectum.  Goal status: INITIAL     LONG TERM GOALS: Target date: 02/18/23   Patient independent with advanced HEP for core strength and improvement of coordination Baseline: Not educated  Goal status: INITIAL   2.  Patient is able to relax her pelvic floor to place the largest dilator into the rectum with minimal to no pain.  Baseline: therapist not able to place her index finger into the rectum Goal status: INITIAL   3.  Patient is able to relax the pelvic floor to push stool out with >/= 75% greater ease and pain decreased </= 2-3/10.  Baseline: pain level is 10/10 and she gets fissures due to not able to relax the pelvic floor.  Goal status: INITIAL   4.  Patient is able to go to the bathroom 3 or less times per day to empty her rectum due to improved pelvic floor coordination and less pain.  Baseline: will go for several hours having a bowel movement.  Goal status: INITIAL   5.  CRAIQ-7 is </= 20 due to reduction of pain and less frustration.  Baseline: CRAIQ-7:57 Goal status:  INITIAL   6.  Patient fecal leakage decreased >/= 75% so she does not have to wear adult diapers.  Baseline: she has 2 full stool emptying per month requiring her to wear adult diapers Goal status: INITIAL   PLAN:   PT FREQUENCY: 1-2x/week   PT DURATION: 12 weeks   PLANNED INTERVENTIONS: Therapeutic exercises, Therapeutic activity, Neuromuscular re-education, Patient/Family education, Joint mobilization, Dry Needling, Electrical stimulation, scar mobilization, Taping, Biofeedback, and Manual therapy   PLAN FOR NEXT SESSION: manual work to the abdomen; manual work to the pelvic floor;  diaphragmatic breathing,  ask about the dilators  Eulis Foster, PT 01/26/23 11:45 AM

## 2023-02-02 ENCOUNTER — Ambulatory Visit: Payer: Medicaid Other | Admitting: Physical Therapy

## 2023-02-02 ENCOUNTER — Encounter: Payer: Self-pay | Admitting: Physical Therapy

## 2023-02-02 DIAGNOSIS — M6289 Other specified disorders of muscle: Secondary | ICD-10-CM

## 2023-02-02 DIAGNOSIS — R102 Pelvic and perineal pain: Secondary | ICD-10-CM

## 2023-02-02 DIAGNOSIS — R279 Unspecified lack of coordination: Secondary | ICD-10-CM

## 2023-02-02 DIAGNOSIS — R252 Cramp and spasm: Secondary | ICD-10-CM | POA: Diagnosis not present

## 2023-02-02 NOTE — Patient Instructions (Addendum)
PROTOCOL FOR REctal DILATORS   Wash dilator with soap and water prior to insertion.    Lay on your back reclined. Knees are to be up and apart while on your bed or in the bathtub with warm water.   Lubricate the end of the dilator with a water-soluble lubricant.  Separate the buttocks  Tense the pelvic floor muscles than relax; while relaxing, slide lubricated dilator( round side of dilator) into the vagina.  Insert toward the direction of your spine. Dilator should feel snug and no pain more than 3/10.   Tense muscles again while holding the dilator so it does not get pushed out; relax and slide it in a little further.   Try blowing out as if filling a balloon; this may relax the muscles and allow penetration.  Repeat blowing out to insert dilator further.  Keep dilator in for 10 minutes if tolerate, with the pelvic floor muscles relaxed to further stretch the canal.   Never force the dilator into the canal. Once the dilator is comfortable start to move in and out, side to side, move your hips in different directions  3-4 times per week Progression of dilator.  When you are able to place dilator into vaginal canal and feel no pain or able to move without difficulty you are ready for the next size.  Before you go to the next size start with the original size for 2 minutes then use the next size up for 5 minutes. When the next size up is easy to use, do not have to start with the smaller size.   [facility] [address], [address] [place], [place] [address] Phone # [phone] Fax [phone]

## 2023-02-02 NOTE — Therapy (Signed)
OUTPATIENT PHYSICAL THERAPY TREATMENT NOTE   Patient Name: Donna Brandt MRN: [id number] DOB:11/07/1975, 47 y.o., female Today's Date: 02/02/2023  PCP: Caffie Damme, MD  REFERRING PROVIDER: Andria Meuse, MD   END OF SESSION:   PT End of Session - 02/02/23 1021     Visit Number 5    Date for PT Re-Evaluation 02/19/23    Authorization Type [employer]    Authorization Time Period 4/22-7/20    Authorization - Visit Number 5    Authorization - Number of Visits 11    PT Start Time 1020    PT Stop Time 1100    PT Time Calculation (min) 40 min    Activity Tolerance Patient tolerated treatment well    Behavior During Therapy [facility] for tasks assessed/performed             Past Medical History:  Diagnosis Date   Adjustment disorder with anxious mood 04/08/2019   Anemia    Colon cancer (HCC) 2018   rectal   Complication of anesthesia    during colonoscopy woke up! and wisdom teeth extraction   HTN (hypertension)    Major depressive disorder, recurrent (HCC)    Migraine    Sickle cell trait (HCC)    Sleep apnea    Past Surgical History:  Procedure Laterality Date   ABDOMINAL HYSTERECTOMY     due to uterine fibroids   AUGMENTATION MAMMAPLASTY Bilateral    COLONOSCOPY  07/16/2018   ENDOMETRIAL ABLATION     FLEXIBLE SIGMOIDOSCOPY N/A 04/12/2017   Procedure: FLEXIBLE SIGMOIDOSCOPY;  Surgeon: Rachael Fee, MD;  Location: [facility];  Service: Endoscopy;  Laterality: N/A;   FLEXIBLE SIGMOIDOSCOPY N/A 07/18/2017   Procedure: FLEXIBLE SIGMOIDOSCOPY EXAM UNDER ANESTHESIA;  Surgeon: Andria Meuse, MD;  Location: [facility];  Service: General;  Laterality: N/A;   FLEXIBLE SIGMOIDOSCOPY  07/26/2017   Procedure: FLEXIBLE SIGMOIDOSCOPY;  Surgeon: Andria Meuse, MD;  Location: [facility];  Service: General;;   FLEXIBLE SIGMOIDOSCOPY N/A 10/19/2017   Procedure: FLEXIBLE SIGMOIDOSCOPY;  Surgeon: Andria Meuse, MD;  Location: [facility];  Service:  General;  Laterality: N/A;   ILEOSTOMY N/A 11/28/2017   Procedure: CLOSURE OF LOOP ILEOSTOMY ERAS PATHWAY;  Surgeon: Andria Meuse, MD;  Location: [facility];  Service: General;  Laterality: N/A;   ILEOSTOMY REVISION     11-28-17   LAPAROSCOPIC LOW ANTERIOR RESECTION N/A 07/26/2017   Procedure: LAPAROSCOPIC VS OPEN  LOW ANTERIOR RESECTION WITH DIVERITING LOOP ILEOSTOMY;  Surgeon: Andria Meuse, MD;  Location: [facility];  Service: General;  Laterality: N/A;   PROCTOSCOPY  07/26/2017   Procedure: PROCTOSCOPY;  Surgeon: Andria Meuse, MD;  Location: [facility];  Service: General;;   TONSILLECTOMY     TUBAL LIGATION     WISDOM TOOTH EXTRACTION     Patient Active Problem List   Diagnosis Date Noted   Adjustment disorder with anxious mood 04/08/2019   Major depressive disorder, recurrent (HCC)    Exertional dyspnea 03/27/2019   Chest pain 02/12/2019   HTN (hypertension)    Rectal cancer (HCC) 07/26/2017   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   REFERRING DIAG:  M62.89 (ICD-10-CM) - Other specified disorders of muscle  R19.8 (ICD-10-CM) - Other specified symptoms and signs involving the digestive system and abdomen  Z85.048 (ICD-10-CM) - Personal history of other malignant neoplasm of rectum, rectosigmoid junction, and anus      THERAPY DIAG:  Cramp and spasm   Pelvic pain   Pelvic  floor dysfunction in female   Rationale for Evaluation and Treatment: Rehabilitation   ONSET DATE: 11/28/2017   SUBJECTIVE:                                                                                                                                                                                            SUBJECTIVE STATEMENT: I have gotten the smallest dilator. I have not gone to the bathroom since last Tuesday.     PAIN:  Are you having pain? Yes NPRS scale: 0/10 today Pain location:  rectal    Pain type: tearing of the tissue pain, sometimes has some blood Pain description:  intermittent    Aggravating factors: bowel movement Relieving factors: complete bowel movement   PRECAUTIONS: Other: rectal cancer   WEIGHT BEARING RESTRICTIONS: No   FALLS:  Has patient fallen in last 6 months? 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 Completed 05/30/2017; hysterectomy     BOWEL MOVEMENT: Pain with bowel movement: Yes Type of bowel movement:Type (Bristol Stool Scale) type 6 mostly and type 2 short pieces, Frequency every other day, Strain Yes, and Splinting no Fully empty rectum: No, needs to go multiple times and feels like the stool is still there, sitting 30 or more minutes then will get the neuropathy Leakage: Yes: 3 times full blown accident per month; stool leakage with a little bit 5 times per month Pads: No, will use an Adult diaper when happens Fiber supplement: No, MD said no fiber supplement   URINATION: Pain with urination: No Fully empty bladder: Yes:   Stream: Strong Urgency: No Frequency: every couple of hours Leakage:  none Pads: No   INTERCOURSE: reduction of moisture, fear of bowel movement Pain with intercourse:  see above Ability to have vaginal penetration:  Yes:   Climax: sometimes Marinoff Scale: 1/3   PREGNANCY: Vaginal deliveries 3 Tearing Yes: first child   PROLAPSE: None     OBJECTIVE:    DIAGNOSTIC FINDINGS:  none   PATIENT SURVEYS:  CRAIQ-7:57   COGNITION: Overall cognitive status: Within functional limits for tasks assessed                          SENSATION: Light touch: Appears intact Proprioception: Appears intact  POSTURE: No  Significant postural limitations   PELVIC ALIGNMENT:   LUMBARAROM/PROM: Lumbar ROM is full     LOWER EXTREMITY ROM: bilateral hip ROM is within normal limits     LOWER EXTREMITY MMT:   MMT Right eval Left eval  Hip extension 4/5 4/5  Hip abduction 4/5 4/5  Hip adduction 4/5 4/5    PALPATION:   General  scar from surgery is restricted                 External Perineal Exam tenderness located in the external anal sphincter, along the perineal body, levator ani externally                             Internal Pelvic Floor therapist is not able to place her finger more than 1/2 inch due to restrictions and pain. Not able to test the patient strength due to the restrictions   Patient confirms identification and approves PT to assess internal pelvic floor and treatment Yes   PELVIC MMT:   MMT eval  Vaginal    Internal Anal Sphincter 3/5 with weak lift   External Anal Sphincter 3/5 with weak lift    Puborectalis 3/5 with weak lift    Diastasis Recti    (Blank rows = not tested)         TONE: Increased    PROLAPSE: Not assessed   TODAY'S TREATMENT:  02/02/23 Manual: Internal pelvic floor techniques: No emotional/communication barriers or cognitive limitation. Patient is motivated to learn. Patient understands and agrees with treatment goals and plan. PT explains patient will be examined in standing, sitting, and lying down to see how their muscles and joints work. When they are ready, they will be asked to remove their underwear so PT can examine their perineum. The patient is also given the option of providing their own chaperone as one is not provided in our facility. The patient also has the right and is explained the right to defer or refuse any part of the evaluation or treatment including the internal exam. With the patient's consent, PT will use one gloved finger to gently assess the muscles of the pelvic floor, seeing how well it contracts and relaxes and if there is  muscle symmetry. After, the patient will get dressed and PT and patient will discuss exam findings and plan of care. PT and patient discuss plan of care, schedule, attendance policy and HEP activities.  Manual work to the perineal body releasing the tissue then worked around the anus  Going through the anus using [employer] Reveleum going through the rectum working on the anal sphincter, puborectalis, anococcygeal ligament, mobilization of the coccyx, manual work to the levator ani monitoring for pain and going slowly Neuromuscular re-education: Pelvic floor contraction training: Therapist finger in the rectum with giving tactile cues to contract the pelvic floor with a lift  Therapist finger in the rectum working on her pushing the therapist finger out of the rectum with diaphragmatic breathing.  Exercises: Stretches/mobility: Educated patient on how to use the rectal dilators, inserting them into the rectum and how often to use them   01/26/23 Exercises: Stretches/mobility: Hamstring stretch supine holding 30 sec bil.  Supine piriformis stretch  supine holding 30 sec bil. Lateral hip stretch pulling leg over the body holding 30 sec bil. Pigeon pose hold 30 sec bil.  Reverse clam 10 x each side Strengthening: Supine marching with abdominal contraction 15 x each  leg Bridge 15 x  Bird dog 10 x each way  12/18/22 Manual: Soft tissue mobilization: Circular massage to the abdomen to promote peristalic motion of the intestines then educated patient on how to perform on her own. Myofascial release: Quadruped pulling the fascia forward to release the abdomen and improve intestinal movement Fascial release of the lower abdomen and mesenteric root Fascial release along the umbilicus Exercises: Stretches/mobility: Marjo Bicker pose with manual work to the lumbar paraspinals and working on breath to open up the posterior rib cage.  Strengthening: Nustep level 3 for 5 minutes while assess  patient                                                                                              PATIENT EDUCATION: 01/26/23 Education details: Access Code: [email or web address], discussed with patient on anal dilators and sent in script Person educated: Patient Education method: Explanation, Demonstration, Tactile cues, Verbal cues, and Handouts Education comprehension: verbalized understanding, returned demonstration, verbal cues required, tactile cues required, and needs further education       HOME EXERCISE PROGRAM: 01/26/23 Access Code: [email or web address] URL: [email or web address] Date: 01/26/2023 Prepared by: Eulis Foster  Exercises - Cat Cow  - 1 x daily - 7 x weekly - 1 sets - 10 reps - Child's Pose Stretch  - 1 x daily - 7 x weekly - 1 sets - 2 reps - 30 sec hold - Supine Pelvic Floor Stretch  - 1 x daily - 7 x weekly - 1 sets - 1 reps - 30 - 60 sec hold - Supine Figure 4 Piriformis Stretch with Leg Extension  - 1 x daily - 7 x weekly - 1 sets - 2 reps - 30 sec hold - Supine Piriformis Stretch with Leg Straight  - 1 x daily - 7 x weekly - 1 sets - 2 reps - 30 sec hold - Supine Bridge  - 1 x daily - 2 x weekly - 3 sets - 10 reps - Supine March  - 1 x daily - 2 x weekly - 2 sets - 10 reps - Sidelying Reverse Clamshell  - 1 x daily - 2 x weekly - 1 sets - 10 reps - Bird Dog  - 1 x daily - 2 x weekly - 2 sets - 10 reps   ASSESSMENT:   CLINICAL IMPRESSION: Patient is a 47 y.o. female who was seen today for physical therapy  treatment for pelvic pain and fecal leakage.  Patient was educated on using the dilator to expand the anal canal. She is not able to push the therapist finger out of the rectum and needs to work on breath. She is able to relax the pelvic floor with breath. Patients muscles responded well with the manual work and elongated. She is able to contract the rectum but difficult to get the lift in the contraction. Patient will benefit from skilled therapy to reduce pain,  improve pelvic floor tissue mobility and improve pelvic floor coordination to relax when having a bowel movement.    OBJECTIVE IMPAIRMENTS: decreased activity tolerance, decreased coordination, decreased endurance, decreased mobility, decreased strength, increased fascial  restrictions, increased muscle spasms, impaired tone, and pain.    ACTIVITY LIMITATIONS: bending, sitting, standing, squatting, continence, locomotion level, and caring for others   PARTICIPATION LIMITATIONS: meal prep, cleaning, laundry, interpersonal relationship, shopping, community activity, and occupation   PERSONAL FACTORS: Age, Time since onset of injury/illness/exacerbation, and 3+ comorbidities: rectal cancer 03/23/2018; LAR with ileostomy 07/26/2017; ileostomy reversal 11/28/2017; Initiation of radiation and concurrent Xeloda 04/23/2017. Completed 05/30/2017; hysterectomy  are also affecting patient's functional outcome.    REHAB POTENTIAL: Good   CLINICAL DECISION MAKING: Evolving/moderate complexity   EVALUATION COMPLEXITY: Moderate     GOALS: Goals reviewed with patient? Yes   SHORT TERM GOALS: Target date: 12/19/22   Patient independent with manual work to the anal sphincter and pelvic floor to reduce tension.  Baseline:Not educated yet Goal status: Met 12/06/22   2.  Patent is independent with hip stretches to lengthen the pelvic floor.  Baseline: Not educated yet Goal status: Met 01/26/23   3.  Patient educated on abdominal massage to improve toileting.  Baseline: Not educated yet Goal status: Met 12/18/22   4.  Patient is able to relax her pelvic floor to place the smallest dilator into the rectum to expand the tissue.  Baseline: therapist not able to place her index finger into the rectum.  Goal status: INITIAL     LONG TERM GOALS: Target date: 02/18/23   Patient independent with advanced HEP for core strength and improvement of coordination Baseline: Not educated  Goal status: INITIAL   2.   Patient is able to relax her pelvic floor to place the largest dilator into the rectum with minimal to no pain.  Baseline: therapist not able to place her index finger into the rectum Goal status: INITIAL   3.  Patient is able to relax the pelvic floor to push stool out with >/= 75% greater ease and pain decreased </= 2-3/10.  Baseline: pain level is 10/10 and she gets fissures due to not able to relax the pelvic floor.  Goal status: INITIAL   4.  Patient is able to go to the bathroom 3 or less times per day to empty her rectum due to improved pelvic floor coordination and less pain.  Baseline: will go for several hours having a bowel movement.  Goal status: INITIAL   5.  CRAIQ-7 is </= 20 due to reduction of pain and less frustration.  Baseline: CRAIQ-7:57 Goal status: INITIAL   6.  Patient fecal leakage decreased >/= 75% so she does not have to wear adult diapers.  Baseline: she has 2 full stool emptying per month requiring her to wear adult diapers Goal status: INITIAL   PLAN:   PT FREQUENCY: 1-2x/week   PT DURATION: 12 weeks   PLANNED INTERVENTIONS: Therapeutic exercises, Therapeutic activity, Neuromuscular re-education, Patient/Family education, Joint mobilization, Dry Needling, Electrical stimulation, scar mobilization, Taping, Biofeedback, and Manual therapy   PLAN FOR NEXT SESSION: manual work to the abdomen;manual work to the pelvic floor;  diaphragmatic breathing,  ask about the dilators  Eulis Foster, PT 02/02/23 11:52 AM

## 2023-02-09 ENCOUNTER — Encounter: Payer: Medicaid Other | Admitting: Physical Therapy

## 2023-02-16 ENCOUNTER — Ambulatory Visit: Payer: Medicaid Other | Admitting: Physical Therapy

## 2023-02-19 ENCOUNTER — Ambulatory Visit: Payer: Medicaid Other | Attending: Surgery | Admitting: Physical Therapy

## 2023-02-19 ENCOUNTER — Encounter: Payer: Self-pay | Admitting: Physical Therapy

## 2023-02-19 DIAGNOSIS — R252 Cramp and spasm: Secondary | ICD-10-CM

## 2023-02-19 DIAGNOSIS — R102 Pelvic and perineal pain: Secondary | ICD-10-CM

## 2023-02-19 DIAGNOSIS — M6289 Other specified disorders of muscle: Secondary | ICD-10-CM

## 2023-02-19 DIAGNOSIS — R279 Unspecified lack of coordination: Secondary | ICD-10-CM | POA: Diagnosis present

## 2023-02-19 NOTE — Therapy (Signed)
OUTPATIENT PHYSICAL THERAPY TREATMENT NOTE   Patient Name: Donna Brandt MRN: [id number] DOB:November 26, 1975, 47 y.o., female Today's Date: 02/19/2023  PCP: Caffie Damme, MD  REFERRING PROVIDER: Andria Meuse, MD   END OF SESSION:   PT End of Session - 02/19/23 1614     Visit Number 6    Date for PT Re-Evaluation 05/14/23    Authorization Type [employer]    Authorization Time Period 4/22-7/20    Authorization - Visit Number 6    Authorization - Number of Visits 11    PT Start Time 1615    PT Stop Time 1655    PT Time Calculation (min) 40 min    Activity Tolerance Patient tolerated treatment well    Behavior During Therapy [facility] for tasks assessed/performed             Past Medical History:  Diagnosis Date   Adjustment disorder with anxious mood 04/08/2019   Anemia    Colon cancer (HCC) 2018   rectal   Complication of anesthesia    during colonoscopy woke up! and wisdom teeth extraction   HTN (hypertension)    Major depressive disorder, recurrent (HCC)    Migraine    Sickle cell trait (HCC)    Sleep apnea    Past Surgical History:  Procedure Laterality Date   ABDOMINAL HYSTERECTOMY     due to uterine fibroids   AUGMENTATION MAMMAPLASTY Bilateral    COLONOSCOPY  07/16/2018   ENDOMETRIAL ABLATION     FLEXIBLE SIGMOIDOSCOPY N/A 04/12/2017   Procedure: FLEXIBLE SIGMOIDOSCOPY;  Surgeon: Rachael Fee, MD;  Location: [facility];  Service: Endoscopy;  Laterality: N/A;   FLEXIBLE SIGMOIDOSCOPY N/A 07/18/2017   Procedure: FLEXIBLE SIGMOIDOSCOPY EXAM UNDER ANESTHESIA;  Surgeon: Andria Meuse, MD;  Location: [facility];  Service: General;  Laterality: N/A;   FLEXIBLE SIGMOIDOSCOPY  07/26/2017   Procedure: FLEXIBLE SIGMOIDOSCOPY;  Surgeon: Andria Meuse, MD;  Location: [facility];  Service: General;;   FLEXIBLE SIGMOIDOSCOPY N/A 10/19/2017   Procedure: FLEXIBLE SIGMOIDOSCOPY;  Surgeon: Andria Meuse, MD;  Location: [facility];  Service:  General;  Laterality: N/A;   ILEOSTOMY N/A 11/28/2017   Procedure: CLOSURE OF LOOP ILEOSTOMY ERAS PATHWAY;  Surgeon: Andria Meuse, MD;  Location: [facility];  Service: General;  Laterality: N/A;   ILEOSTOMY REVISION     11-28-17   LAPAROSCOPIC LOW ANTERIOR RESECTION N/A 07/26/2017   Procedure: LAPAROSCOPIC VS OPEN  LOW ANTERIOR RESECTION WITH DIVERITING LOOP ILEOSTOMY;  Surgeon: Andria Meuse, MD;  Location: [facility];  Service: General;  Laterality: N/A;   PROCTOSCOPY  07/26/2017   Procedure: PROCTOSCOPY;  Surgeon: Andria Meuse, MD;  Location: [facility];  Service: General;;   TONSILLECTOMY     TUBAL LIGATION     WISDOM TOOTH EXTRACTION     Patient Active Problem List   Diagnosis Date Noted   Adjustment disorder with anxious mood 04/08/2019   Major depressive disorder, recurrent (HCC)    Exertional dyspnea 03/27/2019   Chest pain 02/12/2019   HTN (hypertension)    Rectal cancer (HCC) 07/26/2017   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   REFERRING DIAG:  M62.89 (ICD-10-CM) - Other specified disorders of muscle  R19.8 (ICD-10-CM) - Other specified symptoms and signs involving the digestive system and abdomen  Z85.048 (ICD-10-CM) - Personal history of other malignant neoplasm of rectum, rectosigmoid junction, and anus      THERAPY DIAG:  Cramp and spasm   Pelvic pain   Pelvic  floor dysfunction in female   Rationale for Evaluation and Treatment: Rehabilitation   ONSET DATE: 11/28/2017   SUBJECTIVE:                                                                                                                                                                                            SUBJECTIVE STATEMENT: Eat and will have to have a little bowel movement. I have tried the dilator and on the smallest. Not able to get the dilator all the way in and will panic putting it in.    PAIN:  Are you having pain? Yes NPRS scale: 0/10 today Pain location:  rectal    Pain  type: tearing of the tissue pain, sometimes has some blood Pain description: intermittent    Aggravating factors: bowel movement Relieving factors: complete bowel movement   PRECAUTIONS: Other: rectal cancer   WEIGHT BEARING RESTRICTIONS: No   FALLS:  Has patient fallen in last 6 months? 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 Completed 05/30/2017; hysterectomy     BOWEL MOVEMENT: Pain with bowel movement: Yes Type of bowel movement:Type (Bristol Stool Scale) type 6 mostly and type 2 short pieces, Frequency every other day, Strain Yes, and Splinting no Fully empty rectum: No, needs to go multiple times and feels like the stool is still there, sitting 30 or more minutes then will get the neuropathy Leakage: Yes: 3 times full blown accident per month; stool leakage with a little bit 5 times per month Pads: No, will use an Adult diaper when happens Fiber supplement: No, MD said no fiber supplement   URINATION: Pain with urination: No Fully empty bladder: Yes:   Stream: Strong Urgency: No Frequency: every couple of hours Leakage:  none Pads: No   INTERCOURSE: reduction of moisture, fear of bowel movement Pain with intercourse:  see above Ability to have vaginal penetration:  Yes:   Climax: sometimes Marinoff Scale: 1/3   PREGNANCY: Vaginal deliveries 3 Tearing Yes: first child   PROLAPSE: None     OBJECTIVE:    DIAGNOSTIC FINDINGS:  none   PATIENT SURVEYS:  CRAIQ-7:57 02/19/23  CRAIQ-7:57   COGNITION: Overall cognitive status: Within functional limits for tasks assessed  SENSATION: Light touch: Appears intact Proprioception: Appears intact     POSTURE: No Significant postural limitations   PELVIC ALIGNMENT:   LUMBARAROM/PROM: Lumbar ROM is full     LOWER EXTREMITY ROM: bilateral hip ROM is within normal limits     LOWER EXTREMITY MMT:   MMT Right eval Left eval Right/left 02/19/23  Hip extension 4/5 4/5 5/5  Hip abduction 4/5 4/5 4/5  Hip adduction 4/5 4/5 5/5    PALPATION:   General  scar from surgery is restricted                 External Perineal Exam tenderness located in the external anal sphincter, along the perineal body, levator ani externally                             Internal Pelvic Floor therapist is not able to place her finger more than 1/2 inch due to restrictions and pain. Not able to test the patient strength due to the restrictions   Patient confirms identification and approves PT to assess internal pelvic floor and treatment Yes   PELVIC MMT:   MMT eval 02/19/23  Vaginal     Internal Anal Sphincter 3/5 with weak lift  3/5 with weak lift   External Anal Sphincter 3/5 with weak lift   3/5 with weak lift   Puborectalis 3/5 with weak lift   3/5 with weak lift   Diastasis Recti     (Blank rows = not tested)         TONE: Increased    PROLAPSE: Not assessed   TODAY'S TREATMENT:  02/19/23 Manual: Internal pelvic floor techniques: No emotional/communication barriers or cognitive limitation. Patient is motivated to learn. Patient understands and agrees with treatment goals and plan. PT explains patient will be examined in standing, sitting, and lying down to see how their muscles and joints work. When they are ready, they will be asked to remove their underwear so PT can examine their perineum. The patient is also given the option of providing their own chaperone as one is not provided in our facility. The patient also has the right and is explained the right to defer or refuse any part of the evaluation or treatment  including the internal exam. With the patient's consent, PT will use one gloved finger to gently assess the muscles of the pelvic floor, seeing how well it contracts and relaxes and if there is muscle symmetry. After, the patient will get dressed and PT and patient will discuss exam findings and plan of care. PT and patient discuss plan of care, schedule, attendance policy and HEP activities.  Going through the rectum working on the puborectalis, distraction of the coccyx, along the levator ani, deeper into the rectum working on the canal to reduce the tightness Neuromuscular re-education: Pelvic floor contraction training: Therapist finger in the rectum working on pelvic floor contraction with tactile cues to lift the pelvic floor, contract the puborectalis forward and holding the contraction for 5 sec Therapist finger in the rectum working on pushing the therapist finger out of the rectum with correct breath and generating enough force without tightening the rectum  02/02/23 Manual: Internal pelvic floor techniques: No emotional/communication barriers or cognitive limitation. Patient is motivated to learn. Patient understands and agrees with treatment goals and plan. PT explains patient will be examined in standing, sitting, and lying down to see how their muscles and joints work. When they are  ready, they will be asked to remove their underwear so PT can examine their perineum. The patient is also given the option of providing their own chaperone as one is not provided in our facility. The patient also has the right and is explained the right to defer or refuse any part of the evaluation or treatment including the internal exam. With the patient's consent, PT will use one gloved finger to gently assess the muscles of the pelvic floor, seeing how well it contracts and relaxes and if there is muscle symmetry. After, the patient will get dressed and PT and patient will discuss exam findings and plan of  care. PT and patient discuss plan of care, schedule, attendance policy and HEP activities.  Manual work to the perineal body releasing the tissue then worked around the anus  Going through the anus using [employer] Reveleum going through the rectum working on the anal sphincter, puborectalis, anococcygeal ligament, mobilization of the coccyx, manual work to the levator ani monitoring for pain and going slowly Neuromuscular re-education: Pelvic floor contraction training: Therapist finger in the rectum with giving tactile cues to contract the pelvic floor with a lift  Therapist finger in the rectum working on her pushing the therapist finger out of the rectum with diaphragmatic breathing.  Exercises: Stretches/mobility: Educated patient on how to use the rectal dilators, inserting them into the rectum and how often to use them   01/26/23 Exercises: Stretches/mobility: Hamstring stretch supine holding 30 sec bil.  Supine piriformis stretch  supine holding 30 sec bil. Lateral hip stretch pulling leg over the body holding 30 sec bil. Pigeon pose hold 30 sec bil.  Reverse clam 10 x each side Strengthening: Supine marching with abdominal contraction 15 x each leg Bridge 15 x  Bird dog 10 x each way                                                                                           PATIENT EDUCATION: 01/26/23 Education details: Access Code: [email or web address], discussed with patient on anal dilators and sent in script Person educated: Patient Education method: Explanation, Demonstration, Tactile cues, Verbal cues, and Handouts Education comprehension: verbalized understanding, returned demonstration, verbal cues required, tactile cues required, and needs further education       HOME EXERCISE PROGRAM: 01/26/23 Access Code: [email or web address] URL: [email or web address] Date: 01/26/2023 Prepared by: Eulis Foster  Exercises - Cat Cow  - 1 x daily - 7 x weekly - 1 sets - 10 reps -  Child's Pose Stretch  - 1 x daily - 7 x weekly - 1 sets - 2 reps - 30 sec hold - Supine Pelvic Floor Stretch  - 1 x daily - 7 x weekly - 1 sets - 1 reps - 30 - 60 sec hold - Supine Figure 4 Piriformis Stretch with Leg Extension  - 1 x daily - 7 x weekly - 1 sets - 2 reps - 30 sec hold - Supine Piriformis Stretch with Leg Straight  - 1 x daily - 7 x weekly - 1 sets - 2 reps - 30 sec hold - Supine Bridge  - 1 x daily -  2 x weekly - 3 sets - 10 reps - Supine March  - 1 x daily - 2 x weekly - 2 sets - 10 reps - Sidelying Reverse Clamshell  - 1 x daily - 2 x weekly - 1 sets - 10 reps - Bird Dog  - 1 x daily - 2 x weekly - 2 sets - 10 reps   ASSESSMENT:   CLINICAL IMPRESSION: Patient is a 47 y.o. female who was seen today for physical therapy  treatment for pelvic pain and fecal leakage.  Patient has to watch what she eats due to having to go to the bathroom after she eats. Pelvic floor strength is 3/5 and needs tactile cues for the puborectalis come forward and for her to lift the muscles. She has tightness in the rectum further up closing the canal. She still has to go to the bathroom multiple times. She is on the smallest dilator and is anxious to use it. The dilator is not able to go fuly in due ot tightness and the canal is reduced more anteriorly than posteriorly.  Patient will benefit from skilled therapy to reduce pain, improve pelvic floor tissue mobility and improve pelvic floor coordination to relax when having a bowel movement.    OBJECTIVE IMPAIRMENTS: decreased activity tolerance, decreased coordination, decreased endurance, decreased mobility, decreased strength, increased fascial restrictions, increased muscle spasms, impaired tone, and pain.    ACTIVITY LIMITATIONS: bending, sitting, standing, squatting, continence, locomotion level, and caring for others   PARTICIPATION LIMITATIONS: meal prep, cleaning, laundry, interpersonal relationship, shopping, community activity, and occupation    PERSONAL FACTORS: Age, Time since onset of injury/illness/exacerbation, and 3+ comorbidities: rectal cancer 03/23/2018; LAR with ileostomy 07/26/2017; ileostomy reversal 11/28/2017; Initiation of radiation and concurrent Xeloda 04/23/2017. Completed 05/30/2017; hysterectomy  are also affecting patient's functional outcome.    REHAB POTENTIAL: Good   CLINICAL DECISION MAKING: Evolving/moderate complexity   EVALUATION COMPLEXITY: Moderate     GOALS: Goals reviewed with patient? Yes   SHORT TERM GOALS: Target date: 12/19/22   Patient independent with manual work to the anal sphincter and pelvic floor to reduce tension.  Baseline:Not educated yet Goal status: Met 12/06/22   2.  Patent is independent with hip stretches to lengthen the pelvic floor.  Baseline: Not educated yet Goal status: Met 01/26/23   3.  Patient educated on abdominal massage to improve toileting.  Baseline: Not educated yet Goal status: Met 12/18/22   4.  Patient is able to relax her pelvic floor to place the smallest dilator into the rectum to expand the tissue.  Baseline: able to put the smallest dilator partially in the rectum but gets anxious  Goal status: ongoing     LONG TERM GOALS: Target date: 05/14/23   Patient independent with advanced HEP for core strength and improvement of coordination Baseline: Not educated  Goal status: ongoing 02/19/23   2.  Patient is able to relax her pelvic floor to place the largest dilator into the rectum with minimal to no pain.  Baseline: therapist not able to place her index finger into the rectum Goal status: ongoing 02/19/23  3.  Patient is able to relax the pelvic floor to push stool out with >/= 75% greater ease and pain decreased </= 2-3/10.  Baseline: painful and will coming out a little easier but a small amount Goal status: ongoing 02/19/23   4.  Patient is able to go to the bathroom 3 or less times per day to empty her rectum due  to improved pelvic floor coordination  and less pain.  Baseline: will go for several hours having a bowel movement.  Goal status: ongoing   5.  CRAIQ-7 is </= 20 due to reduction of pain and less frustration.  Baseline: CRAIQ-7:57 Goal status: ongoing 02/19/23   6.  Patient fecal leakage decreased >/= 75% so she does not have to wear adult diapers.  Baseline: she has 2 full stool emptying per month requiring her to wear adult diapers Goal status: 02/19/23   PLAN:   PT FREQUENCY: 1-2x/week   PT DURATION: 12 weeks   PLANNED INTERVENTIONS: Therapeutic exercises, Therapeutic activity, Neuromuscular re-education, Patient/Family education, Joint mobilization, Dry Needling, Electrical stimulation, scar mobilization, Taping, Biofeedback, and Manual therapy   PLAN FOR NEXT SESSION: manual work to the abdomen;manual work to the pelvic floor;  diaphragmatic breathing,  Eulis Foster, PT 02/19/23 5:12 PM

## 2023-03-29 ENCOUNTER — Ambulatory Visit: Payer: Medicaid Other | Admitting: Nurse Practitioner

## 2023-03-30 ENCOUNTER — Inpatient Hospital Stay: Payer: Medicaid Other | Attending: Nurse Practitioner | Admitting: Nurse Practitioner

## 2023-04-13 ENCOUNTER — Inpatient Hospital Stay: Payer: Medicaid Other | Attending: Nurse Practitioner | Admitting: Nurse Practitioner

## 2023-04-13 ENCOUNTER — Inpatient Hospital Stay: Payer: Medicaid Other

## 2023-04-13 ENCOUNTER — Telehealth: Payer: Self-pay

## 2023-04-13 ENCOUNTER — Encounter: Payer: Self-pay | Admitting: Nurse Practitioner

## 2023-04-13 VITALS — BP 141/93 | HR 79 | Temp 98.1°F | Resp 18 | Ht 66.0 in | Wt 201.2 lb

## 2023-04-13 DIAGNOSIS — D128 Benign neoplasm of rectum: Secondary | ICD-10-CM | POA: Diagnosis not present

## 2023-04-13 DIAGNOSIS — I10 Essential (primary) hypertension: Secondary | ICD-10-CM | POA: Diagnosis not present

## 2023-04-13 DIAGNOSIS — G629 Polyneuropathy, unspecified: Secondary | ICD-10-CM | POA: Insufficient documentation

## 2023-04-13 DIAGNOSIS — C20 Malignant neoplasm of rectum: Secondary | ICD-10-CM

## 2023-04-13 DIAGNOSIS — R531 Weakness: Secondary | ICD-10-CM | POA: Diagnosis not present

## 2023-04-13 DIAGNOSIS — K5900 Constipation, unspecified: Secondary | ICD-10-CM | POA: Diagnosis not present

## 2023-04-13 DIAGNOSIS — Z79899 Other long term (current) drug therapy: Secondary | ICD-10-CM | POA: Insufficient documentation

## 2023-04-13 DIAGNOSIS — F32A Depression, unspecified: Secondary | ICD-10-CM | POA: Diagnosis not present

## 2023-04-13 DIAGNOSIS — Z923 Personal history of irradiation: Secondary | ICD-10-CM | POA: Insufficient documentation

## 2023-04-13 LAB — CEA (ACCESS): CEA (CHCC): <1 ng/mL (ref 0.00–5.00)

## 2023-04-13 NOTE — Progress Notes (Signed)
  [facility] OFFICE PROGRESS NOTE   Diagnosis:  Rectal cancer  INTERVAL HISTORY:   Donna Brandt returns for follow-up.  She continues to have irregular bowel habits.  She is working with physical therapy.  Periodic bleeding with bowel movements which is "normal" for her.  Objective:  Vital signs in last 24 hours:  Blood pressure (!) 141/93, pulse 79, temperature 98.1 F (36.7 C), temperature source Oral, resp. rate 18, height 5\' 6"  (1.676 m), weight 201 lb 3.2 oz (91.3 kg), SpO2 100%.    Lymphatics: No palpable cervical, supraclavicular, axillary or inguinal lymph nodes. Resp: Lungs clear bilaterally. Cardio: Regular rate and rhythm. GI: Abdomen soft and nontender.  No hepatosplenomegaly.  No mass. Vascular: No leg edema.   Lab Results:  Lab Results  Component Value Date   WBC 4.4 02/12/2019   HGB 14.1 02/12/2019   HCT 42.3 02/12/2019   MCV 77.3 (L) 02/12/2019   PLT 215 02/12/2019   NEUTROABS 2.4 11/16/2017    Imaging:  No results found.  Medications: I have reviewed the patient's current medications.  Assessment/Plan: Rectal cancer, clinical stage T3b,N0,M0 colonoscopy 03/06/2017-biopsy of a rectal mass revealed fragments of an adenomatous lesion with high-grade dysplasia, definite submucosa not available to evaluate for invasion Proctoscopy/biopsy 03/23/2017 confirmed a mass at 6-7 centimeters from the anal verge, biopsy revealed polypoid colorectal mucosa with detached fragments of adenomatous mucosa Staging CTs 03/16/2017, anterior rectal mass, no evidence of metastatic disease, no adenopathy, MRI 03/21/2017- T3b,N0 rectal tumor Initiation of radiation and concurrent Xeloda 04/23/2017. Completed 05/30/2017. Laparoscopic low anterior resection and diverting ileostomy 07/26/2017,ypT2,ypN0 tumor with negative surgical margins, normal mismatch repair protein expression Cycle 1 adjuvant Xeloda 08/20/2017 Xeloda placed on hold beginning 08/28/2017 due to  bilateral leg numbness Xeloda discontinued Flexible sigmoidoscopy 10/19/2017- patent end-to-end colocolonic anastomosis characterized by healthy-appearing mucosa.  No specimens collected. Surveillance colonoscopy 07/16/2018- distal rectum colo-colonic anastomosis normal.  Examination of the colon to the cecum otherwise normal.  No polyps or cancers.   Colonoscopy 09/13/2021 polyps removed from the rectum and cecum, tubular adenomas   2.   Hypertension   3.   Depression   4.   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   Bilateral foot/leg numbness and weakness.  MRI lumbar spine 09/05/2017-no evidence of metastatic disease to the lumbosacral spine.  No stenosis or neural compression.  Fatty marrow changes from mid L5 through the sacrum presumably secondary to previous radiation.  Leg strength improved on exam 09/06/2017.  Per patient report numbness improved, still present over the soles of both feet.    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  Neuropathy improved.   6.   Ileostomy reversal 11/28/2017   7.   Irregular bowel habits-rectal urgency, pain, and constipation  Disposition: Donna Brandt remains in clinical remission from rectal cancer.  We will follow-up on the CEA from today.  She will return for an office visit and CEA in 6 months.    Lonna Cobb ANP/GNP-BC   04/13/2023  1:23 PM

## 2023-04-13 NOTE — Telephone Encounter (Signed)
-----   Message from Lonna Cobb sent at 04/13/2023  3:20 PM EDT ----- Please let her know CEA is stable in normal range, less than 1.00.  Follow-up as scheduled.

## 2023-04-13 NOTE — Telephone Encounter (Signed)
Patient gave verbal understanding and had no further questions or concerns  

## 2023-04-27 ENCOUNTER — Ambulatory Visit: Payer: Medicaid Other | Admitting: Physical Therapy

## 2023-05-04 ENCOUNTER — Encounter: Payer: Medicaid Other | Admitting: Physical Therapy

## 2023-05-11 ENCOUNTER — Ambulatory Visit: Payer: Medicaid Other | Attending: Surgery | Admitting: Physical Therapy

## 2023-05-11 ENCOUNTER — Encounter: Payer: Self-pay | Admitting: Physical Therapy

## 2023-05-11 DIAGNOSIS — M6289 Other specified disorders of muscle: Secondary | ICD-10-CM | POA: Insufficient documentation

## 2023-05-11 DIAGNOSIS — R102 Pelvic and perineal pain: Secondary | ICD-10-CM | POA: Insufficient documentation

## 2023-05-11 DIAGNOSIS — R279 Unspecified lack of coordination: Secondary | ICD-10-CM | POA: Insufficient documentation

## 2023-05-11 DIAGNOSIS — R252 Cramp and spasm: Secondary | ICD-10-CM | POA: Diagnosis present

## 2023-05-11 NOTE — Therapy (Signed)
OUTPATIENT PHYSICAL THERAPY TREATMENT NOTE   Patient Name: Donna Brandt MRN: [id number] DOB:1976/02/01, 47 y.o., female Today's Date: 05/11/2023  PCP: Caffie Damme, MD  REFERRING PROVIDER: Andria Meuse, MD   END OF SESSION:   PT End of Session - 05/11/23 1105     Visit Number 7    Date for PT Re-Evaluation 11/12/23    Authorization Type [employer]    Authorization Time Period 04/26/2023-07/24/2023    Authorization - Visit Number 1    Authorization - Number of Visits 11    PT Start Time 1105    PT Stop Time 1145    PT Time Calculation (min) 40 min    Activity Tolerance Patient tolerated treatment well    Behavior During Therapy [facility] for tasks assessed/performed             Past Medical History:  Diagnosis Date   Adjustment disorder with anxious mood 04/08/2019   Anemia    Colon cancer (HCC) 2018   rectal   Complication of anesthesia    during colonoscopy woke up! and wisdom teeth extraction   HTN (hypertension)    Major depressive disorder, recurrent (HCC)    Migraine    Sickle cell trait (HCC)    Sleep apnea    Past Surgical History:  Procedure Laterality Date   ABDOMINAL HYSTERECTOMY     due to uterine fibroids   AUGMENTATION MAMMAPLASTY Bilateral    COLONOSCOPY  07/16/2018   ENDOMETRIAL ABLATION     FLEXIBLE SIGMOIDOSCOPY N/A 04/12/2017   Procedure: FLEXIBLE SIGMOIDOSCOPY;  Surgeon: Rachael Fee, MD;  Location: [facility];  Service: Endoscopy;  Laterality: N/A;   FLEXIBLE SIGMOIDOSCOPY N/A 07/18/2017   Procedure: FLEXIBLE SIGMOIDOSCOPY EXAM UNDER ANESTHESIA;  Surgeon: Andria Meuse, MD;  Location: [facility];  Service: General;  Laterality: N/A;   FLEXIBLE SIGMOIDOSCOPY  07/26/2017   Procedure: FLEXIBLE SIGMOIDOSCOPY;  Surgeon: Andria Meuse, MD;  Location: [facility];  Service: General;;   FLEXIBLE SIGMOIDOSCOPY N/A 10/19/2017   Procedure: FLEXIBLE SIGMOIDOSCOPY;  Surgeon: Andria Meuse, MD;  Location: [facility];   Service: General;  Laterality: N/A;   ILEOSTOMY N/A 11/28/2017   Procedure: CLOSURE OF LOOP ILEOSTOMY ERAS PATHWAY;  Surgeon: Andria Meuse, MD;  Location: [facility];  Service: General;  Laterality: N/A;   ILEOSTOMY REVISION     11-28-17   LAPAROSCOPIC LOW ANTERIOR RESECTION N/A 07/26/2017   Procedure: LAPAROSCOPIC VS OPEN  LOW ANTERIOR RESECTION WITH DIVERITING LOOP ILEOSTOMY;  Surgeon: Andria Meuse, MD;  Location: [facility];  Service: General;  Laterality: N/A;   PROCTOSCOPY  07/26/2017   Procedure: PROCTOSCOPY;  Surgeon: Andria Meuse, MD;  Location: [facility];  Service: General;;   TONSILLECTOMY     TUBAL LIGATION     WISDOM TOOTH EXTRACTION     Patient Active Problem List   Diagnosis Date Noted   Adjustment disorder with anxious mood 04/08/2019   Major depressive disorder, recurrent (HCC)    Exertional dyspnea 03/27/2019   Chest pain 02/12/2019   HTN (hypertension)    Rectal cancer (HCC) 07/26/2017   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   REFERRING DIAG:  M62.89 (ICD-10-CM) - Other specified disorders of muscle  R19.8 (ICD-10-CM) - Other specified symptoms and signs involving the digestive system and abdomen  Z85.048 (ICD-10-CM) - Personal history of other malignant neoplasm of rectum, rectosigmoid junction, and anus      THERAPY DIAG:  Cramp and spasm   Pelvic pain   Pelvic  floor dysfunction in female   Rationale for Evaluation and Treatment: Rehabilitation   ONSET DATE: 11/28/2017   SUBJECTIVE:                                                                                                                                                                                            SUBJECTIVE STATEMENT: Patient has not been in therapy due to daughter in car accident and was sick. I have seen the wait list come up on my phone. I have not used the dilator for awhile. I am still having excessive blood in my stool. I see Dr. Cliffton Asters on 10/22.      PAIN:  Are  you having pain? Yes NPRS scale: 8/10 today 05/11/23 Pain location:  rectal    Pain type: tearing of the tissue pain, sometimes has some blood Pain description: intermittent    Aggravating factors: bowel movement Relieving factors: complete bowel movement   PRECAUTIONS: Other: rectal cancer   WEIGHT BEARING RESTRICTIONS: No   FALLS:  Has patient fallen in last 6 months? 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 Completed 05/30/2017; hysterectomy     BOWEL MOVEMENT: Pain with bowel movement: Yes Type of bowel movement:Type (Bristol Stool Scale) type 4 mostly  short pieces, Frequency every 2 times per week, Strain Yes, and Splinting no Fully empty rectum: No, needs to go multiple times and feels like the stool is still there, sitting 30 or more minutes then will get the neuropathy Leakage: no leakage Pads: none Fiber supplement: No, MD said no fiber supplement   URINATION: Pain with urination: No Fully empty bladder: Yes:   Stream: Strong Urgency: No Frequency: every couple of hours Leakage:  none Pads: No   INTERCOURSE: reduction of moisture, fear of bowel movement Pain with intercourse:  see above Ability to have vaginal penetration:  Yes:   Climax: sometimes Marinoff Scale: 1/3   PREGNANCY: Vaginal deliveries 3 Tearing Yes: first child   PROLAPSE: None     OBJECTIVE:    DIAGNOSTIC FINDINGS:  none   PATIENT SURVEYS:  CRAIQ-7:57 02/19/23  CRAIQ-7:57   COGNITION: Overall cognitive status: Within functional limits for tasks assessed  SENSATION: Light touch: Appears  intact Proprioception: Appears intact     POSTURE: No Significant postural limitations   PELVIC ALIGNMENT:   LUMBARAROM/PROM: Lumbar ROM is full     LOWER EXTREMITY ROM: bilateral hip ROM is within normal limits     LOWER EXTREMITY MMT:   MMT Right eval Left eval Right/left 02/19/23 Right/left  05/11/23  Hip extension 4/5 4/5 5/5 5/5  Hip abduction 4/5 4/5 4/5 4+/5  Hip adduction 4/5 4/5 5/5 5/5    PALPATION:   General  scar from surgery is restricted                 External Perineal Exam tenderness located in the external anal sphincter, along the perineal body, levator ani externally 05/11/23 Tightness in the perineal body and around the sphincter                              Internal Pelvic Floor therapist is not able to place her finger more than 1/2 inch due to restrictions and pain. Not able to test the patient strength due to the restrictions 05/11/23 Tight band at the puborectalis and it does not come forward, decreased movement of the coccyx, thickness along the posterior rectum   Patient confirms identification and approves PT to assess internal pelvic floor and treatment Yes   PELVIC MMT:   MMT eval 02/19/23 05/11/23  Vaginal      Internal Anal Sphincter 3/5 with weak lift  3/5 with weak lift  2/5  External Anal Sphincter 3/5 with weak lift   3/5 with weak lift  2/5  Puborectalis 3/5 with weak lift   3/5 with weak lift  1/5  Diastasis Recti      (Blank rows = not tested)         TONE: Increased    PROLAPSE: Not assessed   TODAY'S TREATMENT:  05/11/23 Manual: Soft tissue mobilization: Manual work to the perineal body and along the outer portion of the anus Internal pelvic floor techniques: No emotional/communication barriers or cognitive limitation. Patient is motivated to learn. Patient understands and agrees with treatment goals and plan. PT explains patient will be examined in standing, sitting, and lying down to see how their muscles and joints  work. When they are ready, they will be asked to remove their underwear so PT can examine their perineum. The patient is also given the option of providing their own chaperone as one is not provided in our facility. The patient also has the right and is explained the right to defer or refuse any part of the evaluation or treatment including the internal exam. With the patient's consent, PT will use one gloved finger to gently assess the muscles of the pelvic floor, seeing how well it contracts and relaxes and if there is muscle symmetry. After, the patient will get dressed and PT and patient will discuss exam findings and plan of care. PT and patient discuss plan of care, schedule, attendance policy and HEP activities.  Therapist finger in the rectum working on the posterior rectum then proceeded upward to wok on the anococcygeal ligament, mobilized the coccyx, worked on the puborectalis that is a tight band, felt a ditch below the the tight band making it difficult to follow forward up the canal.  Neuromuscular re-education: Pelvic floor contraction training: Working on diaphragmatic breathing to expand the pelvic floor and breath into her fist to increase pressure to push stool out.  Exercises: Stretches/mobility: Educated patient on using her dilators and why it is important to use to stretch the tight puborectalis    02/19/23 Manual: Internal pelvic floor techniques: No emotional/communication barriers or cognitive limitation. Patient is motivated to learn. Patient understands and agrees with treatment goals and plan. PT explains patient will be examined in standing, sitting, and lying down to see how their muscles and joints work. When they are ready, they will be asked to remove their underwear so PT can examine their perineum. The patient is also given the option of providing their own chaperone as one is not provided in our facility. The patient also has the right and is explained the right to  defer or refuse any part of the evaluation or treatment including the internal exam. With the patient's consent, PT will use one gloved finger to gently assess the muscles of the pelvic floor, seeing how well it contracts and relaxes and if there is muscle symmetry. After, the patient will get dressed and PT and patient will discuss exam findings and plan of care. PT and patient discuss plan of care, schedule, attendance policy and HEP activities.  Going through the rectum working on the puborectalis, distraction of the coccyx, along the levator ani, deeper into the rectum working on the canal to reduce the tightness Neuromuscular re-education: Pelvic floor contraction training: Therapist finger in the rectum working on pelvic floor contraction with tactile cues to lift the pelvic floor, contract the puborectalis forward and holding the contraction for 5 sec Therapist finger in the rectum working on pushing the therapist finger out of the rectum with correct breath and generating enough force without tightening the rectum  02/02/23 Manual: Internal pelvic floor techniques: No emotional/communication barriers or cognitive limitation. Patient is motivated to learn. Patient understands and agrees with treatment goals and plan. PT explains patient will be examined in standing, sitting, and lying down to see how their muscles and joints work. When they are ready, they will be asked to remove their underwear so PT can examine their perineum. The patient is also given the option of providing their own chaperone as one is not provided in our facility. The patient also has the right and is explained the right to defer or refuse any part of the evaluation or treatment including the internal exam. With the patient's consent, PT will use one gloved finger to gently assess the muscles of the pelvic floor, seeing how well it contracts and relaxes and if there is muscle symmetry. After, the patient will get dressed and PT  and patient will discuss exam findings and plan of care. PT and patient discuss plan of care, schedule, attendance policy and HEP activities.  Manual work to the perineal body releasing the tissue then worked around the anus  Going through the anus using [employer] Reveleum going through the rectum working on the anal sphincter, puborectalis, anococcygeal ligament, mobilization of the coccyx, manual work to the levator ani monitoring for pain and going slowly Neuromuscular re-education: Pelvic floor contraction training: Therapist finger in the rectum with giving tactile cues to contract the pelvic floor with a lift  Therapist finger in the rectum working on her pushing the therapist finger out of the rectum with diaphragmatic breathing.  Exercises: Stretches/mobility: Educated patient on how to use the rectal dilators, inserting them into the rectum and how often to use them  PATIENT EDUCATION: 01/26/23 Education details: Access Code: [email or web address], discussed with patient on anal dilators and sent in script Person educated: Patient Education method: Explanation, Demonstration, Tactile cues, Verbal cues, and Handouts Education comprehension: verbalized understanding, returned demonstration, verbal cues required, tactile cues required, and needs further education       HOME EXERCISE PROGRAM: 01/26/23 Access Code: [email or web address] URL: [email or web address] Date: 01/26/2023 Prepared by: Eulis Foster  Exercises - Cat Cow  - 1 x daily - 7 x weekly - 1 sets - 10 reps - Child's Pose Stretch  - 1 x daily - 7 x weekly - 1 sets - 2 reps - 30 sec hold - Supine Pelvic Floor Stretch  - 1 x daily - 7 x weekly - 1 sets - 1 reps - 30 - 60 sec hold - Supine Figure 4 Piriformis Stretch with Leg Extension  - 1 x daily - 7 x weekly - 1 sets - 2 reps - 30 sec hold - Supine Piriformis Stretch with Leg Straight   - 1 x daily - 7 x weekly - 1 sets - 2 reps - 30 sec hold - Supine Bridge  - 1 x daily - 2 x weekly - 3 sets - 10 reps - Supine March  - 1 x daily - 2 x weekly - 2 sets - 10 reps - Sidelying Reverse Clamshell  - 1 x daily - 2 x weekly - 1 sets - 10 reps - Bird Dog  - 1 x daily - 2 x weekly - 2 sets - 10 reps   ASSESSMENT:   CLINICAL IMPRESSION: Patient is a 47 y.o. female who was seen today for physical therapy  treatment for pelvic pain and fecal leakage.  Patient has to watch what she eats due to having to go to the bathroom after she eats. Pelvic floor strength is 2/5 and puborectalis is 1/5 needs tactile cues for the puborectalis come forward. The puborectalis is now a tight band that is not moving and therapist finger can go underneath it feeling like there is a cave. She still has to go to the bathroom multiple times. She is not using her dilators and therapist recommends her to resume to stretch the tight areas. She is on the smallest dilator and is anxious to use it. Patient will strain to have a small type 4 bowel movement and sit on the commode for 30 minutes. She reports blood with a bowel movement.  She does not feel she is emptying her rectum. Patient will benefit from skilled therapy to reduce pain, improve pelvic floor tissue mobility and improve pelvic floor coordination to relax when having a bowel movement.    OBJECTIVE IMPAIRMENTS: decreased activity tolerance, decreased coordination, decreased endurance, decreased mobility, decreased strength, increased fascial restrictions, increased muscle spasms, impaired tone, and pain.    ACTIVITY LIMITATIONS: bending, sitting, standing, squatting, continence, locomotion level, and caring for others   PARTICIPATION LIMITATIONS: meal prep, cleaning, laundry, interpersonal relationship, shopping, community activity, and occupation   PERSONAL FACTORS: Age, Time since onset of injury/illness/exacerbation, and 3+ comorbidities: rectal cancer  03/23/2018; LAR with ileostomy 07/26/2017; ileostomy reversal 11/28/2017; Initiation of radiation and concurrent Xeloda 04/23/2017. Completed 05/30/2017; hysterectomy  are also affecting patient's functional outcome.    REHAB POTENTIAL: Good   CLINICAL DECISION MAKING: Evolving/moderate complexity   EVALUATION COMPLEXITY: Moderate     GOALS: Goals reviewed with patient? Yes   SHORT TERM GOALS: Target date: 12/19/22   Patient independent with manual work to the anal sphincter and  pelvic floor to reduce tension.  Baseline:Not educated yet Goal status: Met 12/06/22   2.  Patent is independent with hip stretches to lengthen the pelvic floor.  Baseline: Not educated yet Goal status: Met 01/26/23   3.  Patient educated on abdominal massage to improve toileting.  Baseline: Not educated yet Goal status: Met 12/18/22   4.  Patient is able to relax her pelvic floor to place the smallest dilator into the rectum to expand the tissue.  Baseline: able to put the smallest dilator partially in the rectum but gets anxious  Goal status: ongoing     LONG TERM GOALS: Target date: 11/12/23   Patient independent with advanced HEP for core strength and improvement of coordination Baseline: Not educated  Goal status: ongoing 05/11/23   2.  Patient is able to relax her pelvic floor to place the largest dilator into the rectum with minimal to no pain.  Baseline: therapist not able to place her index finger into the rectum Goal status: ongoing 05/11/23  3.  Patient is able to relax the pelvic floor to push stool out with >/= 75% greater ease and pain decreased </= 2-3/10.  Baseline: painful and will coming out a little easier but a small amount Goal status: ongoing 05/11/23   4.  Patient is able to go to the bathroom 3 or less times per day to empty her rectum due to improved pelvic floor coordination and less pain.  Baseline: will go for several hours having a bowel movement.  Goal status: ongoing   5.   CRAIQ-7 is </= 20 due to reduction of pain and less frustration.  Baseline: CRAIQ-7:57 Goal status: ongoing 05/11/23   6.  Patient fecal leakage decreased >/= 75% so she does not have to wear adult diapers.  Baseline: she has 2 full stool emptying per month requiring her to wear adult diapers Goal status: 05/11/23   PLAN:   PT FREQUENCY: 1-2x/week   PT DURATION: 12 weeks   PLANNED INTERVENTIONS: Therapeutic exercises, Therapeutic activity, Neuromuscular re-education, Patient/Family education, Joint mobilization, Dry Needling, Electrical stimulation, scar mobilization, Taping, Biofeedback, and Manual therapy   PLAN FOR NEXT SESSION: manual work to the abdomen;manual work to the pelvic floor; see about dilator;  diaphragmatic breathing,  Eulis Foster, PT 05/11/23 12:18 PM

## 2023-05-30 ENCOUNTER — Encounter: Payer: Self-pay | Admitting: Physical Therapy

## 2023-05-30 ENCOUNTER — Ambulatory Visit: Payer: Medicaid Other | Admitting: Physical Therapy

## 2023-05-30 DIAGNOSIS — R252 Cramp and spasm: Secondary | ICD-10-CM | POA: Diagnosis not present

## 2023-05-30 DIAGNOSIS — M6289 Other specified disorders of muscle: Secondary | ICD-10-CM

## 2023-05-30 DIAGNOSIS — R102 Pelvic and perineal pain: Secondary | ICD-10-CM

## 2023-05-30 NOTE — Therapy (Signed)
OUTPATIENT PHYSICAL THERAPY TREATMENT NOTE   Patient Name: Donna Brandt MRN: [id number] DOB:1976/05/22, 47 y.o., female Today's Date: 05/30/2023  PCP: Caffie Damme, MD  REFERRING PROVIDER: Andria Meuse, MD   END OF SESSION:   PT End of Session - 05/30/23 1448     Visit Number 8    Date for PT Re-Evaluation 11/12/23    Authorization Type [employer]    Authorization Time Period 04/26/2023-07/24/2023    Authorization - Visit Number 2    Authorization - Number of Visits 11    PT Start Time 1445    PT Stop Time 1525    PT Time Calculation (min) 40 min    Activity Tolerance Patient tolerated treatment well    Behavior During Therapy [facility] for tasks assessed/performed             Past Medical History:  Diagnosis Date   Adjustment disorder with anxious mood 04/08/2019   Anemia    Colon cancer (HCC) 2018   rectal   Complication of anesthesia    during colonoscopy woke up! and wisdom teeth extraction   HTN (hypertension)    Major depressive disorder, recurrent (HCC)    Migraine    Sickle cell trait (HCC)    Sleep apnea    Past Surgical History:  Procedure Laterality Date   ABDOMINAL HYSTERECTOMY     due to uterine fibroids   AUGMENTATION MAMMAPLASTY Bilateral    COLONOSCOPY  07/16/2018   ENDOMETRIAL ABLATION     FLEXIBLE SIGMOIDOSCOPY N/A 04/12/2017   Procedure: FLEXIBLE SIGMOIDOSCOPY;  Surgeon: Rachael Fee, MD;  Location: [facility];  Service: Endoscopy;  Laterality: N/A;   FLEXIBLE SIGMOIDOSCOPY N/A 07/18/2017   Procedure: FLEXIBLE SIGMOIDOSCOPY EXAM UNDER ANESTHESIA;  Surgeon: Andria Meuse, MD;  Location: [facility];  Service: General;  Laterality: N/A;   FLEXIBLE SIGMOIDOSCOPY  07/26/2017   Procedure: FLEXIBLE SIGMOIDOSCOPY;  Surgeon: Andria Meuse, MD;  Location: [facility];  Service: General;;   FLEXIBLE SIGMOIDOSCOPY N/A 10/19/2017   Procedure: FLEXIBLE SIGMOIDOSCOPY;  Surgeon: Andria Meuse, MD;  Location: [facility];   Service: General;  Laterality: N/A;   ILEOSTOMY N/A 11/28/2017   Procedure: CLOSURE OF LOOP ILEOSTOMY ERAS PATHWAY;  Surgeon: Andria Meuse, MD;  Location: [facility];  Service: General;  Laterality: N/A;   ILEOSTOMY REVISION     11-28-17   LAPAROSCOPIC LOW ANTERIOR RESECTION N/A 07/26/2017   Procedure: LAPAROSCOPIC VS OPEN  LOW ANTERIOR RESECTION WITH DIVERITING LOOP ILEOSTOMY;  Surgeon: Andria Meuse, MD;  Location: [facility];  Service: General;  Laterality: N/A;   PROCTOSCOPY  07/26/2017   Procedure: PROCTOSCOPY;  Surgeon: Andria Meuse, MD;  Location: [facility];  Service: General;;   TONSILLECTOMY     TUBAL LIGATION     WISDOM TOOTH EXTRACTION     Patient Active Problem List   Diagnosis Date Noted   Adjustment disorder with anxious mood 04/08/2019   Major depressive disorder, recurrent (HCC)    Exertional dyspnea 03/27/2019   Chest pain 02/12/2019   HTN (hypertension)    Rectal cancer (HCC) 07/26/2017   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   REFERRING DIAG:  M62.89 (ICD-10-CM) - Other specified disorders of muscle  R19.8 (ICD-10-CM) - Other specified symptoms and signs involving the digestive system and abdomen  Z85.048 (ICD-10-CM) - Personal history of other malignant neoplasm of rectum, rectosigmoid junction, and anus      THERAPY DIAG:  Cramp and spasm   Pelvic pain   Pelvic  floor dysfunction in female   Rationale for Evaluation and Treatment: Rehabilitation   ONSET DATE: 11/28/2017   SUBJECTIVE:                                                                                                                                                                                            SUBJECTIVE STATEMENT: I saw Dr. Cliffton Asters. When I go to the bathroom and the stool feels stuck. She has a little bit at a time or a lot comes out. [employer] ends on October 31st. Hard to get into therapy due to work schedule. I have days I am so depressed. I feel like I have a  tear now in the rectum. Yesterday I could not go and feels like I am sitting on a ball.      PAIN:  Are you having pain? Yes NPRS scale: 8/10 today 05/30/23 Pain location:  rectal    Pain type: tearing of the tissue pain, sometimes has some blood Pain description: intermittent    Aggravating factors: bowel movement Relieving factors: complete bowel movement   PRECAUTIONS: Other: rectal cancer   WEIGHT BEARING RESTRICTIONS: No   FALLS:  Has patient fallen in last 6 months? 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 Completed 05/30/2017; hysterectomy     BOWEL MOVEMENT: Pain with bowel movement: Yes Type of bowel movement:Type (Bristol Stool Scale) type 4 mostly  short pieces, Frequency every 2 times per week, Strain Yes, and Splinting no Fully empty rectum: No, needs to go multiple times and feels like the stool is still there, sitting 30 or more minutes then will get the neuropathy Leakage: no leakage Pads: none Fiber supplement: No, MD said no fiber supplement   URINATION: Pain with urination: No Fully empty bladder: Yes:   Stream: Strong Urgency: No Frequency: every couple of hours Leakage:  none Pads: No   INTERCOURSE: reduction of moisture, fear of bowel movement Pain with intercourse:  see above Ability to have vaginal penetration:  Yes:   Climax: sometimes Marinoff Scale: 1/3   PREGNANCY: Vaginal deliveries 3 Tearing Yes: first child   PROLAPSE: None     OBJECTIVE:    DIAGNOSTIC FINDINGS:  none   PATIENT SURVEYS:  CRAIQ-7:57 02/19/23  CRAIQ-7:57   COGNITION: Overall cognitive  status: Within functional limits for tasks assessed                          SENSATION: Light touch: Appears intact Proprioception: Appears intact     POSTURE: No Significant postural limitations   PELVIC ALIGNMENT:   LUMBARAROM/PROM: Lumbar ROM is full     LOWER EXTREMITY ROM: bilateral hip ROM is within normal limits     LOWER EXTREMITY MMT:   MMT Right eval Left eval Right/left 02/19/23 Right/left  05/11/23  Hip extension 4/5 4/5 5/5 5/5  Hip abduction 4/5 4/5 4/5 4+/5  Hip adduction 4/5 4/5 5/5 5/5    PALPATION:   General  scar from surgery is restricted                 External Perineal Exam tenderness located in the external anal sphincter, along the perineal body, levator ani externally 05/11/23 Tightness in the perineal body and around the sphincter                              Internal Pelvic Floor therapist is not able to place her finger more than 1/2 inch due to restrictions and pain. Not able to test the patient strength due to the restrictions 05/11/23 Tight band at the puborectalis and it does not come forward, decreased movement of the coccyx, thickness along the posterior rectum   Patient confirms identification and approves PT to assess internal pelvic floor and treatment Yes   PELVIC MMT:   MMT eval 02/19/23 05/11/23  Vaginal      Internal Anal Sphincter 3/5 with weak lift  3/5 with weak lift  2/5  External Anal Sphincter 3/5 with weak lift   3/5 with weak lift  2/5  Puborectalis 3/5 with weak lift   3/5 with weak lift  1/5  Diastasis Recti      (Blank rows = not tested)         TONE: Increased    PROLAPSE: Not assessed   TODAY'S TREATMENT:  05/30/23 Manual: Soft tissue mobilization: To assess for dry needling Manual work to the external anal sphincter, superior transverse perineum, external anal sphincter and pubococcygeus to lengthen after dry needling Trigger Point Dry-Needling  Treatment instructions: Expect mild to moderate muscle  soreness. S/S of pneumothorax if dry needled over a lung field, and to seek immediate medical attention should they occur. Patient verbalized understanding of these instructions and education.  Patient Consent Given: Yes Education handout provided: Yes Muscles treated: external sphincter, perineal body, superior transverse perineum, external anal sphincter, puborectalis Electrical stimulation performed: No Parameters: N/A Treatment response/outcome: elongation of tissue and trigger point response  Neuromuscular re-education: Pelvic floor contraction training: Laying on her left side. Using pelvic floor EMG to the rectum using external anal sphincters to work on relaxation below 10 uv and using breath outward and keeping the rectal muscles relaxed, having patient understand when she is contracting and breathing out and when she is contracting and breathing with relaxation.     05/11/23 Manual: Soft tissue mobilization: Manual work to the perineal body and along the outer portion of the anus Internal pelvic floor techniques: No emotional/communication barriers or cognitive limitation. Patient is motivated to learn. Patient understands and agrees with treatment goals and plan. PT explains patient will be examined in standing, sitting, and lying down to see how their muscles and joints work. When they are ready,  they will be asked to remove their underwear so PT can examine their perineum. The patient is also given the option of providing their own chaperone as one is not provided in our facility. The patient also has the right and is explained the right to defer or refuse any part of the evaluation or treatment including the internal exam. With the patient's consent, PT will use one gloved finger to gently assess the muscles of the pelvic floor, seeing how well it contracts and relaxes and if there is muscle symmetry. After, the patient will get dressed and PT and patient will discuss exam findings  and plan of care. PT and patient discuss plan of care, schedule, attendance policy and HEP activities.  Therapist finger in the rectum working on the posterior rectum then proceeded upward to wok on the anococcygeal ligament, mobilized the coccyx, worked on the puborectalis that is a tight band, felt a ditch below the the tight band making it difficult to follow forward up the canal.  Neuromuscular re-education: Pelvic floor contraction training: Working on diaphragmatic breathing to expand the pelvic floor and breath into her fist to increase pressure to push stool out.  Exercises: Stretches/mobility: Educated patient on using her dilators and why it is important to use to stretch the tight puborectalis    02/19/23 Manual: Internal pelvic floor techniques: No emotional/communication barriers or cognitive limitation. Patient is motivated to learn. Patient understands and agrees with treatment goals and plan. PT explains patient will be examined in standing, sitting, and lying down to see how their muscles and joints work. When they are ready, they will be asked to remove their underwear so PT can examine their perineum. The patient is also given the option of providing their own chaperone as one is not provided in our facility. The patient also has the right and is explained the right to defer or refuse any part of the evaluation or treatment including the internal exam. With the patient's consent, PT will use one gloved finger to gently assess the muscles of the pelvic floor, seeing how well it contracts and relaxes and if there is muscle symmetry. After, the patient will get dressed and PT and patient will discuss exam findings and plan of care. PT and patient discuss plan of care, schedule, attendance policy and HEP activities.  Going through the rectum working on the puborectalis, distraction of the coccyx, along the levator ani, deeper into the rectum working on the canal to reduce the  tightness Neuromuscular re-education: Pelvic floor contraction training: Therapist finger in the rectum working on pelvic floor contraction with tactile cues to lift the pelvic floor, contract the puborectalis forward and holding the contraction for 5 sec Therapist finger in the rectum working on pushing the therapist finger out of the rectum with correct breath and generating enough force without tightening the rectum                                                                                           PATIENT EDUCATION: 01/26/23 Education details: Access Code: [email or web address], discussed with patient on anal dilators and sent in script Person educated: Patient  Education method: Explanation, Demonstration, Tactile cues, Verbal cues, and Handouts Education comprehension: verbalized understanding, returned demonstration, verbal cues required, tactile cues required, and needs further education       HOME EXERCISE PROGRAM: 01/26/23 Access Code: [email or web address] URL: [email or web address] Date: 01/26/2023 Prepared by: Eulis Foster  Exercises - Cat Cow  - 1 x daily - 7 x weekly - 1 sets - 10 reps - Child's Pose Stretch  - 1 x daily - 7 x weekly - 1 sets - 2 reps - 30 sec hold - Supine Pelvic Floor Stretch  - 1 x daily - 7 x weekly - 1 sets - 1 reps - 30 - 60 sec hold - Supine Figure 4 Piriformis Stretch with Leg Extension  - 1 x daily - 7 x weekly - 1 sets - 2 reps - 30 sec hold - Supine Piriformis Stretch with Leg Straight  - 1 x daily - 7 x weekly - 1 sets - 2 reps - 30 sec hold - Supine Bridge  - 1 x daily - 2 x weekly - 3 sets - 10 reps - Supine March  - 1 x daily - 2 x weekly - 2 sets - 10 reps - Sidelying Reverse Clamshell  - 1 x daily - 2 x weekly - 1 sets - 10 reps - Bird Dog  - 1 x daily - 2 x weekly - 2 sets - 10 reps   ASSESSMENT:   CLINICAL IMPRESSION: Patient is a 47 y.o. female who was seen today for physical therapy  treatment for pelvic pain and fecal leakage.  Patient  had increased pain when coming to therapy today and not able to sit on her rectum. She did well with the dry needling and it reduced her pain to 0/10. Patient had many trigger points in the rectal muscles. She did well with the pelvic floor EMG. She learned how to keep her pelvic floor muscles at 10 uv while blowing out like she was having a bowel movement. She was able to do this 3 out of 10 times. Patient resting tone goes between 10 uv to 5 uv laying on left side.  Patient felt relaxed for first time after treatment. Patient will benefit from skilled therapy to reduce pain, improve pelvic floor tissue mobility and improve pelvic floor coordination to relax when having a bowel movement.    OBJECTIVE IMPAIRMENTS: decreased activity tolerance, decreased coordination, decreased endurance, decreased mobility, decreased strength, increased fascial restrictions, increased muscle spasms, impaired tone, and pain.    ACTIVITY LIMITATIONS: bending, sitting, standing, squatting, continence, locomotion level, and caring for others   PARTICIPATION LIMITATIONS: meal prep, cleaning, laundry, interpersonal relationship, shopping, community activity, and occupation   PERSONAL FACTORS: Age, Time since onset of injury/illness/exacerbation, and 3+ comorbidities: rectal cancer 03/23/2018; LAR with ileostomy 07/26/2017; ileostomy reversal 11/28/2017; Initiation of radiation and concurrent Xeloda 04/23/2017. Completed 05/30/2017; hysterectomy  are also affecting patient's functional outcome.    REHAB POTENTIAL: Good   CLINICAL DECISION MAKING: Evolving/moderate complexity   EVALUATION COMPLEXITY: Moderate     GOALS: Goals reviewed with patient? Yes   SHORT TERM GOALS: Target date: 12/19/22   Patient independent with manual work to the anal sphincter and pelvic floor to reduce tension.  Baseline:Not educated yet Goal status: Met 12/06/22   2.  Patent is independent with hip stretches to lengthen the pelvic floor.   Baseline: Not educated yet Goal status: Met 01/26/23   3.  Patient educated on abdominal massage to improve toileting.  Baseline:  Not educated yet Goal status: Met 12/18/22   4.  Patient is able to relax her pelvic floor to place the smallest dilator into the rectum to expand the tissue.  Baseline: able to put the smallest dilator partially in the rectum but gets anxious  Goal status: ongoing     LONG TERM GOALS: Target date: 11/12/23   Patient independent with advanced HEP for core strength and improvement of coordination Baseline: Not educated  Goal status: ongoing 05/11/23   2.  Patient is able to relax her pelvic floor to place the largest dilator into the rectum with minimal to no pain.  Baseline: therapist not able to place her index finger into the rectum Goal status: ongoing 05/11/23  3.  Patient is able to relax the pelvic floor to push stool out with >/= 75% greater ease and pain decreased </= 2-3/10.  Baseline: painful and will coming out a little easier but a small amount Goal status: ongoing 05/11/23   4.  Patient is able to go to the bathroom 3 or less times per day to empty her rectum due to improved pelvic floor coordination and less pain.  Baseline: will go for several hours having a bowel movement.  Goal status: ongoing   5.  CRAIQ-7 is </= 20 due to reduction of pain and less frustration.  Baseline: CRAIQ-7:57 Goal status: ongoing 05/11/23   6.  Patient fecal leakage decreased >/= 75% so she does not have to wear adult diapers.  Baseline: she has 2 full stool emptying per month requiring her to wear adult diapers Goal status: 05/11/23   PLAN:   PT FREQUENCY: 1-2x/week   PT DURATION: 12 weeks   PLANNED INTERVENTIONS: Therapeutic exercises, Therapeutic activity, Neuromuscular re-education, Patient/Family education, Joint mobilization, Dry Needling, Electrical stimulation, scar mobilization, Taping, Biofeedback, and Manual therapy   PLAN FOR NEXT SESSION: dry  needling to pelvic floor muscles with pelvic floor EMG, manual work to the pelvic floor muscles  Eulis Foster, PT 05/30/23 3:35 PM

## 2023-05-30 NOTE — Patient Instructions (Signed)

## 2023-06-06 ENCOUNTER — Ambulatory Visit: Payer: Medicaid Other | Admitting: Physical Therapy

## 2023-07-16 ENCOUNTER — Encounter: Payer: Self-pay | Admitting: Physical Therapy

## 2023-07-16 ENCOUNTER — Ambulatory Visit: Payer: No Typology Code available for payment source | Admitting: Physical Therapy

## 2023-07-16 DIAGNOSIS — M6289 Other specified disorders of muscle: Secondary | ICD-10-CM | POA: Insufficient documentation

## 2023-07-16 DIAGNOSIS — R252 Cramp and spasm: Secondary | ICD-10-CM

## 2023-07-16 DIAGNOSIS — R279 Unspecified lack of coordination: Secondary | ICD-10-CM | POA: Insufficient documentation

## 2023-07-16 DIAGNOSIS — R102 Pelvic and perineal pain: Secondary | ICD-10-CM

## 2023-07-16 NOTE — Therapy (Addendum)
 OUTPATIENT PHYSICAL THERAPY TREATMENT NOTE   Patient Name: Donna Brandt MRN: [id number] DOB:May 16, 1976, 47 y.o., female Today's Date: 07/16/2023  PCP: Caffie Damme, MD  REFERRING PROVIDER: Andria Meuse, MD   END OF SESSION:   PT End of Session - 07/16/23 1230     Visit Number --    Date for PT Re-Evaluation --    Authorization Type --    Authorization Time Period --    Authorization - Visit Number --    Authorization - Number of Visits --    PT Start Time --    PT Stop Time --    PT Time Calculation (min) --    Activity Tolerance --    Behavior During Therapy --             Past Medical History:  Diagnosis Date   Adjustment disorder with anxious mood 04/08/2019   Anemia    Colon cancer (HCC) 2018   rectal   Complication of anesthesia    during colonoscopy woke up! and wisdom teeth extraction   HTN (hypertension)    Major depressive disorder, recurrent (HCC)    Migraine    Sickle cell trait (HCC)    Sleep apnea    Past Surgical History:  Procedure Laterality Date   ABDOMINAL HYSTERECTOMY     due to uterine fibroids   AUGMENTATION MAMMAPLASTY Bilateral    COLONOSCOPY  07/16/2018   ENDOMETRIAL ABLATION     FLEXIBLE SIGMOIDOSCOPY N/A 04/12/2017   Procedure: FLEXIBLE SIGMOIDOSCOPY;  Surgeon: Rachael Fee, MD;  Location: [facility];  Service: Endoscopy;  Laterality: N/A;   FLEXIBLE SIGMOIDOSCOPY N/A 07/18/2017   Procedure: FLEXIBLE SIGMOIDOSCOPY EXAM UNDER ANESTHESIA;  Surgeon: Andria Meuse, MD;  Location: [facility];  Service: General;  Laterality: N/A;   FLEXIBLE SIGMOIDOSCOPY  07/26/2017   Procedure: FLEXIBLE SIGMOIDOSCOPY;  Surgeon: Andria Meuse, MD;  Location: [facility];  Service: General;;   FLEXIBLE SIGMOIDOSCOPY N/A 10/19/2017   Procedure: FLEXIBLE SIGMOIDOSCOPY;  Surgeon: Andria Meuse, MD;  Location: [facility];  Service: General;  Laterality: N/A;   ILEOSTOMY N/A 11/28/2017   Procedure: CLOSURE OF LOOP ILEOSTOMY  ERAS PATHWAY;  Surgeon: Andria Meuse, MD;  Location: [facility];  Service: General;  Laterality: N/A;   ILEOSTOMY REVISION     11-28-17   LAPAROSCOPIC LOW ANTERIOR RESECTION N/A 07/26/2017   Procedure: LAPAROSCOPIC VS OPEN  LOW ANTERIOR RESECTION WITH DIVERITING LOOP ILEOSTOMY;  Surgeon: Andria Meuse, MD;  Location: [facility];  Service: General;  Laterality: N/A;   PROCTOSCOPY  07/26/2017   Procedure: PROCTOSCOPY;  Surgeon: Andria Meuse, MD;  Location: [facility];  Service: General;;   TONSILLECTOMY     TUBAL LIGATION     WISDOM TOOTH EXTRACTION     Patient Active Problem List   Diagnosis Date Noted   Adjustment disorder with anxious mood 04/08/2019   Major depressive disorder, recurrent (HCC)    Exertional dyspnea 03/27/2019   Chest pain 02/12/2019   HTN (hypertension)    Rectal cancer (HCC) 07/26/2017   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   REFERRING DIAG:  M62.89 (ICD-10-CM) - Other specified disorders of muscle  R19.8 (ICD-10-CM) - Other specified symptoms and signs involving the digestive system and abdomen  Z85.048 (ICD-10-CM) - Personal history of other malignant neoplasm of rectum, rectosigmoid junction, and anus      THERAPY DIAG:  Cramp and spasm   Pelvic pain   Pelvic floor dysfunction in female   Rationale for  Evaluation and Treatment: Rehabilitation   ONSET DATE: 11/28/2017   SUBJECTIVE:                                                                                                                                                                                            SUBJECTIVE STATEMENT: I saw Dr. Cliffton Asters. When I go to the bathroom and the stool feels stuck. She has a little bit at a time or a lot comes out. [employer] ends on October 31st. Hard to get into therapy due to work schedule. I have days I am so depressed. I feel like I have a tear now in the rectum. Yesterday I could not go and feels like I am sitting on a ball.      PAIN:   Are you having pain? Yes NPRS scale: 8/10 today 05/30/23 Pain location:  rectal    Pain type: tearing of the tissue pain, sometimes has some blood Pain description: intermittent    Aggravating factors: bowel movement Relieving factors: complete bowel movement   PRECAUTIONS: Other: rectal cancer   WEIGHT BEARING RESTRICTIONS: No   FALLS:  Has patient fallen in last 6 months? 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 Completed 05/30/2017; hysterectomy     BOWEL MOVEMENT: Pain with bowel movement: Yes Type of bowel movement:Type (Bristol Stool Scale) type 4 mostly  short pieces, Frequency every 2 times per week, Strain Yes, and Splinting no Fully empty rectum: No, needs to go multiple times and feels like the stool is still there, sitting 30 or more minutes then will get the neuropathy Leakage: no leakage Pads: none Fiber supplement: No, MD said no fiber supplement   URINATION: Pain with urination: No Fully empty bladder: Yes:   Stream: Strong Urgency: No Frequency: every couple of hours Leakage:  none Pads: No   INTERCOURSE: reduction of moisture, fear of bowel movement Pain with intercourse:  see above Ability to have vaginal penetration:  Yes:   Climax: sometimes Marinoff Scale: 1/3   PREGNANCY: Vaginal deliveries 3 Tearing Yes: first child   PROLAPSE: None     OBJECTIVE:    DIAGNOSTIC FINDINGS:  none   PATIENT SURVEYS:  CRAIQ-7:57 02/19/23  CRAIQ-7:57   COGNITION: Overall cognitive status: Within functional limits for tasks assessed  SENSATION: Light touch:  Appears intact Proprioception: Appears intact     POSTURE: No Significant postural limitations   PELVIC ALIGNMENT:   LUMBARAROM/PROM: Lumbar ROM is full     LOWER EXTREMITY ROM: bilateral hip ROM is within normal limits     LOWER EXTREMITY MMT:   MMT Right eval Left eval Right/left 02/19/23 Right/left  05/11/23  Hip extension 4/5 4/5 5/5 5/5  Hip abduction 4/5 4/5 4/5 4+/5  Hip adduction 4/5 4/5 5/5 5/5    PALPATION:   General  scar from surgery is restricted                 External Perineal Exam tenderness located in the external anal sphincter, along the perineal body, levator ani externally 05/11/23 Tightness in the perineal body and around the sphincter                              Internal Pelvic Floor therapist is not able to place her finger more than 1/2 inch due to restrictions and pain. Not able to test the patient strength due to the restrictions 05/11/23 Tight band at the puborectalis and it does not come forward, decreased movement of the coccyx, thickness along the posterior rectum   Patient confirms identification and approves PT to assess internal pelvic floor and treatment Yes   PELVIC MMT:   MMT eval 02/19/23 05/11/23  Vaginal      Internal Anal Sphincter 3/5 with weak lift  3/5 with weak lift  2/5  External Anal Sphincter 3/5 with weak lift   3/5 with weak lift  2/5  Puborectalis 3/5 with weak lift   3/5 with weak lift  1/5  Diastasis Recti      (Blank rows = not tested)         TONE: Increased    PROLAPSE: Not assessed   TODAY'S TREATMENT:  05/30/23 Manual: Soft tissue mobilization: To assess for dry needling Manual work to the external anal sphincter, superior transverse perineum, external anal sphincter and pubococcygeus to lengthen after dry needling Trigger Point Dry-Needling  Treatment instructions: Expect mild to moderate muscle soreness. S/S of pneumothorax if dry needled over a lung field, and to seek immediate medical attention  should they occur. Patient verbalized understanding of these instructions and education.  Patient Consent Given: Yes Education handout provided: Yes Muscles treated: external sphincter, perineal body, superior transverse perineum, external anal sphincter, puborectalis Electrical stimulation performed: No Parameters: N/A Treatment response/outcome: elongation of tissue and trigger point response  Neuromuscular re-education: Pelvic floor contraction training: Laying on her left side. Using pelvic floor EMG to the rectum using external anal sphincters to work on relaxation below 10 uv and using breath outward and keeping the rectal muscles relaxed, having patient understand when she is contracting and breathing out and when she is contracting and breathing with relaxation.     05/11/23 Manual: Soft tissue mobilization: Manual work to the perineal body and along the outer portion of the anus Internal pelvic floor techniques: No emotional/communication barriers or cognitive limitation. Patient is motivated to learn. Patient understands and agrees with treatment goals and plan. PT explains patient will be examined in standing, sitting, and lying down to see how their muscles and joints work. When they are ready, they will be asked to remove their underwear so PT can examine their perineum. The patient is also given the option of providing their own chaperone as one is not provided  in our facility. The patient also has the right and is explained the right to defer or refuse any part of the evaluation or treatment including the internal exam. With the patient's consent, PT will use one gloved finger to gently assess the muscles of the pelvic floor, seeing how well it contracts and relaxes and if there is muscle symmetry. After, the patient will get dressed and PT and patient will discuss exam findings and plan of care. PT and patient discuss plan of care, schedule, attendance policy and HEP activities.   Therapist finger in the rectum working on the posterior rectum then proceeded upward to wok on the anococcygeal ligament, mobilized the coccyx, worked on the puborectalis that is a tight band, felt a ditch below the the tight band making it difficult to follow forward up the canal.  Neuromuscular re-education: Pelvic floor contraction training: Working on diaphragmatic breathing to expand the pelvic floor and breath into her fist to increase pressure to push stool out.  Exercises: Stretches/mobility: Educated patient on using her dilators and why it is important to use to stretch the tight puborectalis    02/19/23 Manual: Internal pelvic floor techniques: No emotional/communication barriers or cognitive limitation. Patient is motivated to learn. Patient understands and agrees with treatment goals and plan. PT explains patient will be examined in standing, sitting, and lying down to see how their muscles and joints work. When they are ready, they will be asked to remove their underwear so PT can examine their perineum. The patient is also given the option of providing their own chaperone as one is not provided in our facility. The patient also has the right and is explained the right to defer or refuse any part of the evaluation or treatment including the internal exam. With the patient's consent, PT will use one gloved finger to gently assess the muscles of the pelvic floor, seeing how well it contracts and relaxes and if there is muscle symmetry. After, the patient will get dressed and PT and patient will discuss exam findings and plan of care. PT and patient discuss plan of care, schedule, attendance policy and HEP activities.  Going through the rectum working on the puborectalis, distraction of the coccyx, along the levator ani, deeper into the rectum working on the canal to reduce the tightness Neuromuscular re-education: Pelvic floor contraction training: Therapist finger in the rectum working  on pelvic floor contraction with tactile cues to lift the pelvic floor, contract the puborectalis forward and holding the contraction for 5 sec Therapist finger in the rectum working on pushing the therapist finger out of the rectum with correct breath and generating enough force without tightening the rectum                                                                                           PATIENT EDUCATION: 01/26/23 Education details: Access Code: [email or web address], discussed with patient on anal dilators and sent in script Person educated: Patient Education method: Explanation, Demonstration, Tactile cues, Verbal cues, and Handouts Education comprehension: verbalized understanding, returned demonstration, verbal cues required, tactile cues required, and needs further education  HOME EXERCISE PROGRAM: 01/26/23 Access Code: [email or web address] URL: [email or web address] Date: 01/26/2023 Prepared by: Eulis Foster  Exercises - Cat Cow  - 1 x daily - 7 x weekly - 1 sets - 10 reps - Child's Pose Stretch  - 1 x daily - 7 x weekly - 1 sets - 2 reps - 30 sec hold - Supine Pelvic Floor Stretch  - 1 x daily - 7 x weekly - 1 sets - 1 reps - 30 - 60 sec hold - Supine Figure 4 Piriformis Stretch with Leg Extension  - 1 x daily - 7 x weekly - 1 sets - 2 reps - 30 sec hold - Supine Piriformis Stretch with Leg Straight  - 1 x daily - 7 x weekly - 1 sets - 2 reps - 30 sec hold - Supine Bridge  - 1 x daily - 2 x weekly - 3 sets - 10 reps - Supine March  - 1 x daily - 2 x weekly - 2 sets - 10 reps - Sidelying Reverse Clamshell  - 1 x daily - 2 x weekly - 1 sets - 10 reps - Bird Dog  - 1 x daily - 2 x weekly - 2 sets - 10 reps   ASSESSMENT:   CLINICAL IMPRESSION: Patient is a 47 y.o. female who was seen today for physical therapy  treatment for pelvic pain and fecal leakage.  Patient had increased pain when coming to therapy today and not able to sit on her rectum. She did well with the dry  needling and it reduced her pain to 0/10. Patient had many trigger points in the rectal muscles. She did well with the pelvic floor EMG. She learned how to keep her pelvic floor muscles at 10 uv while blowing out like she was having a bowel movement. She was able to do this 3 out of 10 times. Patient resting tone goes between 10 uv to 5 uv laying on left side.  Patient felt relaxed for first time after treatment. Patient will benefit from skilled therapy to reduce pain, improve pelvic floor tissue mobility and improve pelvic floor coordination to relax when having a bowel movement.    OBJECTIVE IMPAIRMENTS: decreased activity tolerance, decreased coordination, decreased endurance, decreased mobility, decreased strength, increased fascial restrictions, increased muscle spasms, impaired tone, and pain.    ACTIVITY LIMITATIONS: bending, sitting, standing, squatting, continence, locomotion level, and caring for others   PARTICIPATION LIMITATIONS: meal prep, cleaning, laundry, interpersonal relationship, shopping, community activity, and occupation   PERSONAL FACTORS: Age, Time since onset of injury/illness/exacerbation, and 3+ comorbidities: rectal cancer 03/23/2018; LAR with ileostomy 07/26/2017; ileostomy reversal 11/28/2017; Initiation of radiation and concurrent Xeloda 04/23/2017. Completed 05/30/2017; hysterectomy  are also affecting patient's functional outcome.    REHAB POTENTIAL: Good   CLINICAL DECISION MAKING: Evolving/moderate complexity   EVALUATION COMPLEXITY: Moderate     GOALS: Goals reviewed with patient? Yes   SHORT TERM GOALS: Target date: 12/19/22   Patient independent with manual work to the anal sphincter and pelvic floor to reduce tension.  Baseline:Not educated yet Goal status: Met 12/06/22   2.  Patent is independent with hip stretches to lengthen the pelvic floor.  Baseline: Not educated yet Goal status: Met 01/26/23   3.  Patient educated on abdominal massage to improve  toileting.  Baseline: Not educated yet Goal status: Met 12/18/22   4.  Patient is able to relax her pelvic floor to place the smallest dilator into the rectum to expand the tissue.  Baseline: able to put the smallest dilator partially in the rectum but gets anxious  Goal status: ongoing     LONG TERM GOALS: Target date: 11/12/23   Patient independent with advanced HEP for core strength and improvement of coordination Baseline: Not educated  Goal status: ongoing 05/11/23   2.  Patient is able to relax her pelvic floor to place the largest dilator into the rectum with minimal to no pain.  Baseline: therapist not able to place her index finger into the rectum Goal status: ongoing 05/11/23  3.  Patient is able to relax the pelvic floor to push stool out with >/= 75% greater ease and pain decreased </= 2-3/10.  Baseline: painful and will coming out a little easier but a small amount Goal status: ongoing 05/11/23   4.  Patient is able to go to the bathroom 3 or less times per day to empty her rectum due to improved pelvic floor coordination and less pain.  Baseline: will go for several hours having a bowel movement.  Goal status: ongoing   5.  CRAIQ-7 is </= 20 due to reduction of pain and less frustration.  Baseline: CRAIQ-7:57 Goal status: ongoing 05/11/23   6.  Patient fecal leakage decreased >/= 75% so she does not have to wear adult diapers.  Baseline: she has 2 full stool emptying per month requiring her to wear adult diapers Goal status: 05/11/23   PLAN:   PT FREQUENCY: 1-2x/week   PT DURATION: 12 weeks   PLANNED INTERVENTIONS: Therapeutic exercises, Therapeutic activity, Neuromuscular re-education, Patient/Family education, Joint mobilization, Dry Needling, Electrical stimulation, scar mobilization, Taping, Biofeedback, and Manual therapy   PLAN FOR NEXT SESSION: dry needling to pelvic floor muscles with pelvic floor EMG, manual work to the pelvic floor muscles  Eulis Foster,  PT 07/16/23 12:37 PM    PHYSICAL THERAPY DISCHARGE SUMMARY  Visits from Start of Care: 8  Current functional level related to goals / functional outcomes: See above.    Remaining deficits: See above. She has not returned since last visit.    Education / Equipment: HEP   Patient agrees to discharge. Patient goals were not met. Patient is being discharged due to not returning since the last visit. Thank you for the referral. Eulis Foster, PT 11/12/23 4:06 PM

## 2023-10-19 ENCOUNTER — Other Ambulatory Visit: Payer: Medicaid Other

## 2023-10-19 ENCOUNTER — Ambulatory Visit: Payer: Medicaid Other | Admitting: Nurse Practitioner

## 2023-10-22 ENCOUNTER — Other Ambulatory Visit: Payer: Medicaid Other

## 2023-10-22 ENCOUNTER — Other Ambulatory Visit: Payer: Self-pay

## 2023-10-22 ENCOUNTER — Ambulatory Visit: Payer: Medicaid Other | Admitting: Oncology

## 2023-10-23 ENCOUNTER — Encounter: Payer: Self-pay | Admitting: *Deleted

## 2023-10-23 NOTE — Progress Notes (Signed)
 Patient left message requesting to reschedule her 3/21 visit. Scheduling message sent to reschedule for late April or early May Patient called again today re: appt. Canceled 3/21 as requested and informed her scheduler will call to reschedule. Sent scheduling message again.

## 2023-10-24 ENCOUNTER — Telehealth: Payer: Self-pay | Admitting: Oncology

## 2023-10-24 NOTE — Telephone Encounter (Signed)
 Spoke with patient confirming upcoming appointment

## 2023-10-26 ENCOUNTER — Other Ambulatory Visit: Payer: No Typology Code available for payment source

## 2023-10-26 ENCOUNTER — Ambulatory Visit: Payer: Self-pay | Admitting: Oncology

## 2023-10-31 ENCOUNTER — Encounter (HOSPITAL_BASED_OUTPATIENT_CLINIC_OR_DEPARTMENT_OTHER): Payer: Self-pay | Admitting: Emergency Medicine

## 2023-10-31 ENCOUNTER — Emergency Department (HOSPITAL_BASED_OUTPATIENT_CLINIC_OR_DEPARTMENT_OTHER)
Admission: EM | Admit: 2023-10-31 | Discharge: 2023-10-31 | Disposition: A | Discharge status: Home / Self Care | Attending: Emergency Medicine | Admitting: Emergency Medicine

## 2023-10-31 ENCOUNTER — Other Ambulatory Visit: Payer: Self-pay

## 2023-10-31 DIAGNOSIS — M791 Myalgia, unspecified site: Secondary | ICD-10-CM | POA: Diagnosis not present

## 2023-10-31 DIAGNOSIS — Z91040 Latex allergy status: Secondary | ICD-10-CM | POA: Diagnosis not present

## 2023-10-31 DIAGNOSIS — M7918 Myalgia, other site: Secondary | ICD-10-CM

## 2023-10-31 DIAGNOSIS — Y92410 Unspecified street and highway as the place of occurrence of the external cause: Secondary | ICD-10-CM | POA: Diagnosis not present

## 2023-10-31 DIAGNOSIS — M542 Cervicalgia: Secondary | ICD-10-CM | POA: Diagnosis not present

## 2023-10-31 DIAGNOSIS — R519 Headache, unspecified: Secondary | ICD-10-CM | POA: Insufficient documentation

## 2023-10-31 DIAGNOSIS — M79671 Pain in right foot: Secondary | ICD-10-CM | POA: Diagnosis not present

## 2023-10-31 DIAGNOSIS — Z794 Long term (current) use of insulin: Secondary | ICD-10-CM | POA: Insufficient documentation

## 2023-10-31 MED ORDER — IBUPROFEN 600 MG PO TABS: 600.0000 mg | ORAL_TABLET | Freq: Four times a day (QID) | ORAL | 0 refills | Status: AC | PRN

## 2023-10-31 MED ORDER — IBUPROFEN 600 MG PO TABS: 600.0000 mg | ORAL_TABLET | Freq: Four times a day (QID) | ORAL | 0 refills | Status: DC | PRN

## 2023-10-31 MED ORDER — METHOCARBAMOL 750 MG PO TABS: 750.0000 mg | ORAL_TABLET | Freq: Four times a day (QID) | ORAL | 0 refills | Status: DC

## 2023-10-31 MED ORDER — HYDROCODONE-ACETAMINOPHEN 5-325 MG PO TABS: 1.0000 | ORAL_TABLET | ORAL | 0 refills | Status: DC | PRN

## 2023-10-31 MED ORDER — HYDROCODONE-ACETAMINOPHEN 5-325 MG PO TABS: 1.0000 | ORAL_TABLET | ORAL | 0 refills | Status: AC | PRN
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
## 2023-10-31 NOTE — Discharge Instructions (Signed)
 Cool compresses in the first 48 hours and then you can alternate warm and cool compresses to sore areas. Take medications as prescribed.   Follow up with your doctor as needed if pain does not improve as expected over the next 4-5 days. Return to the ED as needed for new concerns.
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
## 2023-11-09 ENCOUNTER — Other Ambulatory Visit: Payer: Self-pay | Admitting: Chiropractic Medicine

## 2023-11-09 DIAGNOSIS — R200 Anesthesia of skin: Secondary | ICD-10-CM

## 2023-11-09 DIAGNOSIS — M542 Cervicalgia: Secondary | ICD-10-CM

## 2023-11-18 ENCOUNTER — Ambulatory Visit
Admission: RE | Admit: 2023-11-18 | Discharge: 2023-11-18 | Disposition: A | Discharge status: Home / Self Care | Payer: Self-pay | Source: Ambulatory Visit | Attending: Chiropractic Medicine | Admitting: Chiropractic Medicine

## 2023-11-18 DIAGNOSIS — R200 Anesthesia of skin: Secondary | ICD-10-CM

## 2023-11-18 DIAGNOSIS — M542 Cervicalgia: Secondary | ICD-10-CM

## 2023-11-30 ENCOUNTER — Inpatient Hospital Stay: Payer: Self-pay | Admitting: Oncology

## 2023-11-30 ENCOUNTER — Inpatient Hospital Stay: Attending: Oncology

## 2023-11-30 VITALS — BP 131/101 | HR 73 | Temp 98.1°F | Resp 18 | Ht 66.0 in | Wt 189.0 lb

## 2023-11-30 DIAGNOSIS — Z08 Encounter for follow-up examination after completed treatment for malignant neoplasm: Secondary | ICD-10-CM | POA: Diagnosis present

## 2023-11-30 DIAGNOSIS — C20 Malignant neoplasm of rectum: Secondary | ICD-10-CM

## 2023-11-30 DIAGNOSIS — Z85048 Personal history of other malignant neoplasm of rectum, rectosigmoid junction, and anus: Secondary | ICD-10-CM | POA: Insufficient documentation

## 2023-11-30 LAB — CEA (ACCESS): CEA (CHCC): <1 ng/mL (ref 0.00–5.00)

## 2023-11-30 NOTE — Progress Notes (Signed)
 [facility] OFFICE PROGRESS NOTE   Diagnosis: Rectal cancer  INTERVAL HISTORY:   Donna Brandt returns as scheduled.  She continues to have irregular bowel habits and rectal discomfort.  She uses diltiazem ointment.  Dr. Camilo Cella in October 2024.  She reports chronic intermittent rectal bleeding.  She relates weight loss to Trulicity .  No nausea or abdominal pain. She completed pelvic physical therapy, but her insurance changed and she can no longer afford therapy.  She reports her blood pressure is under good control with measured at home and with Dr. Felipe Horton.  Objective:  Vital signs in last 24 hours:  Blood pressure (!) 131/101, pulse 73, temperature 98.1 F (36.7 C), temperature source Temporal, resp. rate 18, height 5\' 6"  (1.676 m), weight 189 lb (85.7 kg), SpO2 100%.    Lymphatics: No cervical, supraclavicular, axillary, or inguinal nodes Resp: Lungs clear bilaterally Cardio: Regular rate and rhythm GI: No hepatosplenomegaly, no mass, nontender Vascular: No leg edema   Lab Results:  Lab Results  Component Value Date   WBC 4.4 02/12/2019   HGB 14.1 02/12/2019   HCT 42.3 02/12/2019   MCV 77.3 (L) 02/12/2019   PLT 215 02/12/2019   NEUTROABS 2.4 11/16/2017    CMP  Lab Results  Component Value Date   NA 139 02/12/2019   K 4.0 02/12/2019   CL 103 02/12/2019   CO2 24 02/12/2019   GLUCOSE 93 02/12/2019   BUN 8 02/12/2019   CREATININE 0.56 02/12/2019   CALCIUM 10.6 (H) 02/12/2019   PROT 7.7 04/10/2018   ALBUMIN 2.8 (L) 07/09/2018   AST 21 11/16/2017   ALT 23 11/16/2017   ALKPHOS 114 11/16/2017   BILITOT 0.4 11/16/2017   GFRNONAA >60 02/12/2019   GFRAA >60 02/12/2019     Medications: I have reviewed the patient's current medications.   Assessment/Plan: Rectal cancer, clinical stage T3b,N0,M0 colonoscopy 03/06/2017-biopsy of a rectal mass revealed fragments of an adenomatous lesion with high-grade dysplasia, definite submucosa not available to  evaluate for invasion Proctoscopy/biopsy 03/23/2017 confirmed a mass at 6-7 centimeters from the anal verge, biopsy revealed polypoid colorectal mucosa with detached fragments of adenomatous mucosa Staging CTs 03/16/2017, anterior rectal mass, no evidence of metastatic disease, no adenopathy, MRI 03/21/2017- T3b,N0 rectal tumor Initiation of radiation and concurrent Xeloda  04/23/2017. Completed 05/30/2017. Laparoscopic low anterior resection and diverting ileostomy 07/26/2017,ypT2,ypN0 tumor with negative surgical margins, normal mismatch repair protein expression Cycle 1 adjuvant Xeloda  08/20/2017 Xeloda  placed on hold beginning 08/28/2017 due to bilateral leg numbness Xeloda  discontinued Flexible sigmoidoscopy 10/19/2017- patent end-to-end colocolonic anastomosis characterized by healthy-appearing mucosa.  No specimens collected. Surveillance colonoscopy 07/16/2018- distal rectum colo-colonic anastomosis normal.  Examination of the colon to the cecum otherwise normal.  No polyps or cancers.   Colonoscopy 09/13/2021 polyps removed from the rectum and cecum, tubular adenomas   2.   Hypertension   3.   Depression   4.   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   Bilateral foot/leg numbness and weakness.  MRI lumbar spine 09/05/2017-no evidence of metastatic disease to the lumbosacral spine.  No stenosis or neural compression.  Fatty marrow changes from mid L5 through the sacrum presumably secondary to previous radiation.  Leg strength improved on exam 09/06/2017.  Per patient report numbness improved, still present over the soles of both feet.    Evaluated by neurology 04/10/2018-diagnosed with neuropathy, started on Lyrica .  Neuropathy improved.   6.   Ileostomy reversal 11/28/2017   7.   Irregular bowel  habits-rectal urgency, pain, and constipation    Disposition: [name] is in clinical remission from rectal cancer.  She is now almost 7 years out from diagnosis.  She will continue  colonoscopy surveillance with gastroenterology.  She last had a colonoscopy in February 2023 with removal of tubular adenomas.  She last saw Dr. 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  We will refer her to Dr. Camilo Cella.  [name] was discharged from medical oncology clinic today.  I am available to see her as needed.  She will continue follow-up with Dr. Felipe Horton for management of hypertension and internal medicine care.  Coni Deep, MD  11/30/2023  10:59 AM

## 2024-01-25 ENCOUNTER — Ambulatory Visit: Admitting: Gastroenterology

## 2024-02-13 ENCOUNTER — Other Ambulatory Visit: Payer: Self-pay | Admitting: Family Medicine

## 2024-02-13 DIAGNOSIS — Z1231 Encounter for screening mammogram for malignant neoplasm of breast: Secondary | ICD-10-CM

## 2024-02-15 ENCOUNTER — Ambulatory Visit (INDEPENDENT_AMBULATORY_CARE_PROVIDER_SITE_OTHER): Admitting: Gastroenterology

## 2024-02-15 ENCOUNTER — Encounter: Payer: Self-pay | Admitting: Gastroenterology

## 2024-02-15 VITALS — BP 122/84 | HR 78 | Ht 66.0 in | Wt 182.4 lb

## 2024-02-15 DIAGNOSIS — Z85048 Personal history of other malignant neoplasm of rectum, rectosigmoid junction, and anus: Secondary | ICD-10-CM | POA: Diagnosis not present

## 2024-02-15 DIAGNOSIS — K602 Anal fissure, unspecified: Secondary | ICD-10-CM | POA: Diagnosis not present

## 2024-02-15 DIAGNOSIS — K5909 Other constipation: Secondary | ICD-10-CM

## 2024-02-15 NOTE — Progress Notes (Signed)
 Chief Complaint:  follow-up constipation  Primary GI Doctor:Dr Mansouraty   HPI:  Patient is a  48  year old female patient with past medical history of  history of T3b, N0 rectal cancer diagnosed in 2018 s/p chemoradiation followed by [facility], who was referred on 11/30/23 by Dr. Arley Hof, for rectal CA. Patient last seen in GI office by Vina, NP on 05/08/22.    11/30/23 oncology note- She continues to have irregular bowel habits and rectal discomfort. She uses diltiazem ointment. Dr. Teresa in October 2024. She reports chronic intermittent rectal bleeding. She relates weight loss to Trulicity . No nausea or abdominal pain. She last saw Dr. 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 We will refer her to Dr. Teresa.   01/07/24 Dr Teresa- exam showed:Anorectal: Normal perianal skin. 2 tiny tags stable in appearance -1 in the anterior midline and 1 in the posterior midline. Acute appearing anal fissure in the anterior midline. No visible sphincter muscle. DRE deferred today due to acute anal fissure findings, plans in place for GI/endoscopic evaluation.   Interval History     Patient presents for evaluation of chronic constipation.  Patient has tried high-fiber diet along with over-the-counter laxatives over the years without much relief.  Patient will have rectal pressure with urge to go to the restroom no result.  Patient will sit there for several minutes which normally causes issues with anal fissures but also with her neuropathy.  Patient states it is difficult for her to stand up with the heaviness in her feet from the neuropathy.  Patient will have intermittent rectal bleeding with clots.  She recently was seen and evaluated by Dr. Teresa who prescribed topical nifedipine for anal fissure which she states has been helping some.  We also instructed her to start daily MiraLAX  which she states helped initially but now not as effective.  Patient has also  tried over-the-counter Fleet enemas, smooth move tea, and suppositories without any improvement.  In the past patient has been prescribed Amitiza  24 mcg twice daily which patient reports caused severe diarrhea.       She inquires today about dietary modifications as well as other therapeutic treatments for the constipation.  Wt Readings from Last 3 Encounters:  02/15/24 182 lb 6 oz (82.7 kg)  11/30/23 189 lb (85.7 kg)  04/13/23 201 lb 3.2 oz (91.3 kg)    Past Medical History:  Diagnosis Date   Adjustment disorder with anxious mood 04/08/2019   Anemia    Colon cancer (HCC) 2018   rectal   Complication of anesthesia    during colonoscopy woke up! and wisdom teeth extraction   HTN (hypertension)    Major depressive disorder, recurrent (HCC)    Migraine    Sickle cell trait (HCC)    Sleep apnea     Past Surgical History:  Procedure Laterality Date   ABDOMINAL HYSTERECTOMY     due to uterine fibroids   AUGMENTATION MAMMAPLASTY Bilateral    COLONOSCOPY  07/16/2018   ENDOMETRIAL ABLATION     FLEXIBLE SIGMOIDOSCOPY N/A 04/12/2017   Procedure: FLEXIBLE SIGMOIDOSCOPY;  Surgeon: Teressa Toribio SQUIBB, MD;  Location: [facility];  Service: Endoscopy;  Laterality: N/A;   FLEXIBLE SIGMOIDOSCOPY N/A 07/18/2017   Procedure: FLEXIBLE SIGMOIDOSCOPY EXAM UNDER ANESTHESIA;  Surgeon: Teresa Lonni HERO, MD;  Location: [facility];  Service: General;  Laterality: N/A;   FLEXIBLE SIGMOIDOSCOPY  07/26/2017   Procedure: FLEXIBLE SIGMOIDOSCOPY;  Surgeon: Teresa Lonni  M, MD;  Location: [facility];  Service: General;;   FLEXIBLE SIGMOIDOSCOPY N/A 10/19/2017   Procedure: ENID MORIN;  Surgeon: Teresa Lonni HERO, MD;  Location: [facility];  Service: General;  Laterality: N/A;   ILEOSTOMY N/A 11/28/2017   Procedure: CLOSURE OF LOOP ILEOSTOMY ERAS PATHWAY;  Surgeon: Teresa Lonni HERO, MD;  Location: [facility];  Service: General;  Laterality: N/A;   ILEOSTOMY REVISION     11-28-17   LAPAROSCOPIC  LOW ANTERIOR RESECTION N/A 07/26/2017   Procedure: LAPAROSCOPIC VS OPEN  LOW ANTERIOR RESECTION WITH DIVERITING LOOP ILEOSTOMY;  Surgeon: Teresa Lonni HERO, MD;  Location: [facility];  Service: General;  Laterality: N/A;   PROCTOSCOPY  07/26/2017   Procedure: PROCTOSCOPY;  Surgeon: Teresa Lonni HERO, MD;  Location: [facility];  Service: General;;   TONSILLECTOMY     TUBAL LIGATION     WISDOM TOOTH EXTRACTION      Current Outpatient Medications  Medication Sig Dispense Refill   amlodipine -olmesartan (AZOR) 10-20 MG tablet Take 1 tablet by mouth daily.     HYDROcodone -acetaminophen  (NORCO/VICODIN) 5-325 MG tablet Take 1 tablet by mouth every 4 (four) hours as needed. 12 tablet 0   ibuprofen  (ADVIL ) 600 MG tablet Take 1 tablet (600 mg total) by mouth every 6 (six) hours as needed. 30 tablet 0   NON FORMULARY Diltiazem 2%/Lidocaine5% compound Use 3 x rectally daily for 2 months to heal anal fissure 30 g 1   OZEMPIC, 1 MG/DOSE, 4 MG/3ML SOPN Inject 1 mg into the skin once a week.     Vitamin D, Ergocalciferol, (DRISDOL) 1.25 MG (50000 UNIT) CAPS capsule Take 50,000 Units by mouth once a week.     No current facility-administered medications for this visit.    Allergies as of 02/15/2024 - Review Complete 02/15/2024  Allergen Reaction Noted   Sulfa antibiotics Rash 08/21/2013   Dilaudid [hydromorphone hcl] Itching 09/04/2016   Latex Rash and Other (See Comments) 12/11/2016    Family History  Problem Relation Age of Onset   Heart attack Mother    Diabetes Mother    Hypertension Mother    Stroke Mother    Hypertension Father    Stroke Father    Seizures Father    Diabetes Maternal Grandmother    Asthma Son    Asthma Son    Breast cancer Other 72   Colon cancer Neg Hx    Esophageal cancer Neg Hx    Rectal cancer Neg Hx    Stomach cancer Neg Hx    Colon polyps Neg Hx    Pancreatic cancer Neg Hx     Review of Systems:    Constitutional: No weight loss, fever, chills, weakness or  fatigue HEENT: Eyes: No change in vision               Ears, Nose, Throat:  No change in hearing or congestion Skin: No rash or itching Cardiovascular: No chest pain, chest pressure or palpitations   Respiratory: No SOB or cough Gastrointestinal: See HPI and otherwise negative Genitourinary: No dysuria or change in urinary frequency Neurological: No headache, dizziness or syncope Musculoskeletal: No new muscle or joint pain Hematologic: No bleeding or bruising Psychiatric: No history of depression or anxiety    Physical Exam:  Vital signs: BP 122/84   Pulse 78   Ht 5' 6 (1.676 m)   Wt 182 lb 6 oz (82.7 kg)   SpO2 95%   BMI 29.44 kg/m   Constitutional:   Pleasant  female  appears to be in NAD, Well developed, Well nourished, alert and cooperative Throat: Oral cavity and pharynx without inflammation, swelling or lesion.  Respiratory: Respirations even and unlabored. Lungs clear to auscultation bilaterally.   No wheezes, crackles, or rhonchi.  Cardiovascular: Normal S1, S2. Regular rate and rhythm. No peripheral edema, cyanosis or pallor.  Gastrointestinal:  Soft, nondistended, nontender. No rebound or guarding. Hypoactive bowel sounds. No appreciable masses or hepatomegaly. Rectal:  Not performed.  Msk:  Symmetrical without gross deformities. Without edema, no deformity or joint abnormality.  Neurologic:  Alert and  oriented x4;  grossly normal neurologically.  Skin:   Dry and intact without significant lesions or rashes. Psychiatric: Oriented to person, place and time. Demonstrates good judgement and reason without abnormal affect or behaviors.  RELEVANT LABS AND IMAGING: CBC    Latest Ref Rng & Units 02/12/2019   12:55 PM 11/29/2017    5:00 AM 11/16/2017   11:16 AM  CBC  WBC 4.0 - 10.5 K/uL 4.4  8.1  3.4   Hemoglobin 12.0 - 15.0 g/dL 85.8  88.7  88.6   Hematocrit 36.0 - 46.0 % 42.3  33.8  34.5   Platelets 150 - 400 K/uL 215  198  175      CMP     Latest Ref Rng & Units  02/12/2019   12:55 PM 04/10/2018   10:40 AM 12/02/2017    4:34 AM  CMP  Glucose 70 - 99 mg/dL 93   889   BUN 6 - 20 mg/dL 8   8   Creatinine 9.55 - 1.00 mg/dL 9.43   9.47   Sodium 864 - 145 mmol/L 139   139   Potassium 3.5 - 5.1 mmol/L 4.0   3.8   Chloride 98 - 111 mmol/L 103   106   CO2 22 - 32 mmol/L 24   25   Calcium 8.9 - 10.3 mg/dL 89.3   89.7   Total Protein 6.1 - 8.1 g/dL  7.7       Lab Results  Component Value Date   TSH 2.25 04/10/2018  09/13/2021 colonoscopy with Dr. Teressa, recall 3 years - Three 2 to 3 mm polyps in the rectum and in the cecum, removed with a cold snare. Resected and retrieved. - Normal colo- colonic anastomosis 1- 2cm from the anal verge. - The examination was otherwise normal on direct and retroflexion views. Path:  TUBULAR ADENOMAS, WITH MILD EARLY ADENOMATOUS CHANGES, NEGATIVE FOR HIGH-GRADE DYSPLASIA. - NEGATIVE FOR MALIGNANCY. 10/06/2019 flex sig with Dr. Teressa  Low but otherwise normal appearing colo- colonic anastomosis ( 1- 2cm from the anal verge) . Very small ' rectal' vault precluded safe retroflex view of the anus. 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  She is going to stop fiber supplements and instead start taking 1 imodium  every morning on a scheduled basis. She will call my office in 2- 3 weeks to report on her response. 07/16/2018 colonoscopy with dr. Teressa - Distal rectum colo- colonic anastomosis was normal, located about 2cm from the anal verge. - Examination of the colon to the cecum was otherwise normal. - No polyps or cancers. 07/2017 flex sig  with Dr. Teresa  - Hemorrhoids and perianal skin tags found on perianal exam. - Rectal mass/ scar 2 cm from the anal verge. - Tumor/ scar in the distal rectum with tattoo surrounding it. - The examination was otherwise normal. - No specimens collected. 04/2017 colonoscopy with Dr.  Teressa - 3cm, heaped up, malignant appearing mass along the left-  posterior wall of the distal rectum with distal edge 3cm from the anal verge. The mass was biopsied extensively; it seems to have a soft, villous exterior but it is firm deeper. Following biopsies I labeled the lateral borders of the mass with submucosal injection of SPOT.    Assessment: Encounter Diagnoses  Name Primary?   Chronic constipation Yes   Rectal cancer [facility])      49 year-old female patient who presents with chronic constipation not relieved with high-fiber diet and over-the-counter laxatives.  Patient has concerns with some of the prescribed indications as they can cause diarrhea therefore we discussed trying MiraLAX  twice daily initially and if that does not work will provide samples of pro secretory agent Linzess 72 mcg p.o. daily.  We also spent several minutes discussing high-fiber diet and foods to avoid along with pushing lots of fluid and physical activity as tolerated.    Patient is currently being treated for anal fissure by Dr. Teresa with topical compound medication which has helped some.  She reports that they have also mention potentially doing Botox, recommend she follow-up as scheduled.    Patient in clinical remission from rectal cancer, recall colonoscopy due in February 2026.  Patient would like to have follow-up with Dr. Wilhelmenia prior to next colon screening, will schedule fup in 6 mths.  Plan: -recommend high fiber diet  -Samples Linzess 72 mcg po daily provided today -recall colonoscopy 09/2024  with Dr. Wilhelmenia  -follow-up with Dr. Wilhelmenia (only) in 6 mths   Thank you for the courtesy of this consult. Please call me with any questions or concerns.   Abbagail Scaff, FNP-C [facility] 02/15/2024, 4:41 PM  Cc: Claudene Round, MD

## 2024-02-15 NOTE — Patient Instructions (Addendum)
 Recommend high fiber diet Samples Linzess 72 mcg po daily 1 tablet po daily 30-45 minutes before first meal of day with full glass of water.  _______________________________________________________  If your blood pressure at your visit was 140/90 or greater, please contact your primary care physician to follow up on this.  _______________________________________________________  If you are age 49 or older, your body mass index should be between 23-30. Your Body mass index is 29.44 kg/m. If this is out of the aforementioned range listed, please consider follow up with your Primary Care Provider.  If you are age 50 or younger, your body mass index should be between 19-25. Your Body mass index is 29.44 kg/m. If this is out of the aformentioned range listed, please consider follow up with your Primary Care Provider.   ________________________________________________________  The [facility] providers would like to encourage you to use MYCHART to communicate with providers for non-urgent requests or questions.  Due to long hold times on the telephone, sending your provider a message by [facility] may be a faster and more efficient way to get a response.  Please allow 48 business hours for a response.  Please remember that this is for non-urgent requests.  _______________________________________________________  Thank you for trusting me with your gastrointestinal care. Deanna May, RNP

## 2024-02-17 NOTE — Progress Notes (Signed)
 Attending Physician's Attestation   I have reviewed the chart.   I agree with the Advanced Practitioner's note, impression, and recommendations with any updates as below.    Corliss Parish, MD [facility] Advanced Endoscopy Office # [phone]

## 2024-02-25 ENCOUNTER — Ambulatory Visit

## 2024-03-04 ENCOUNTER — Telehealth: Payer: Self-pay | Admitting: Gastroenterology

## 2024-03-04 NOTE — Telephone Encounter (Signed)
 Patient called and stated that she has noticed a change with the sample of Linzess  72mcg. Patient is requesting that we send her a prescription for the Linzess . Please advise.

## 2024-03-05 ENCOUNTER — Other Ambulatory Visit: Payer: Self-pay | Admitting: Gastroenterology

## 2024-03-05 DIAGNOSIS — K5909 Other constipation: Secondary | ICD-10-CM

## 2024-03-05 MED ORDER — LINACLOTIDE 72 MCG PO CAPS: 72.0000 ug | ORAL_CAPSULE | Freq: Every day | ORAL | 3 refills | Status: AC

## 2024-03-05 NOTE — Progress Notes (Signed)
 Linzess  72mcg po daily sent to pharmacy on file

## 2024-03-06 ENCOUNTER — Other Ambulatory Visit (HOSPITAL_COMMUNITY): Payer: Self-pay

## 2024-03-06 ENCOUNTER — Telehealth: Payer: Self-pay

## 2024-03-06 NOTE — Telephone Encounter (Signed)
 Pharmacy Patient Advocate Encounter   Received notification from CoverMyMeds that prior authorization for Linzess  capsules is required/requested.   Insurance verification completed.   The patient is insured through [facility] .   Per test claim: PA required; PA submitted to above mentioned insurance via CoverMyMeds Key/confirmation #/EOC [id number] Status is pending

## 2024-03-06 NOTE — Telephone Encounter (Signed)
 Pharmacy Patient Advocate Encounter  Received notification from [employer] that Prior Authorization for Linzess  capsules has been APPROVED from 03-06-2024 to 03-06-2025   PA #/Case ID/Reference #: [id number]

## 2024-03-07 ENCOUNTER — Ambulatory Visit
Admission: RE | Admit: 2024-03-07 | Discharge: 2024-03-07 | Disposition: A | Discharge status: Home / Self Care | Source: Ambulatory Visit | Attending: Family Medicine | Admitting: Family Medicine

## 2024-03-07 DIAGNOSIS — Z1231 Encounter for screening mammogram for malignant neoplasm of breast: Secondary | ICD-10-CM
